# Patient Record
Sex: Female | Born: 1960 | Race: White | Hispanic: No | Marital: Married | Smoking: Former smoker
Health system: Southern US, Community
[De-identification: ages and names within clinical notes are randomized; demographics above are authoritative.]

## PROBLEM LIST (undated history)

## (undated) DIAGNOSIS — K219 Gastro-esophageal reflux disease without esophagitis: Secondary | ICD-10-CM

## (undated) DIAGNOSIS — K209 Esophagitis, unspecified: Secondary | ICD-10-CM

## (undated) DIAGNOSIS — Z8719 Personal history of other diseases of the digestive system: Secondary | ICD-10-CM

## (undated) DIAGNOSIS — I10 Essential (primary) hypertension: Secondary | ICD-10-CM

## (undated) DIAGNOSIS — N200 Calculus of kidney: Secondary | ICD-10-CM

## (undated) HISTORY — DX: Gastro-esophageal reflux disease without esophagitis: K21.9

## (undated) HISTORY — PX: COLONOSCOPY: SHX174

## (undated) HISTORY — DX: Essential (primary) hypertension: I10

## (undated) HISTORY — DX: Calculus of kidney: N20.0

## (undated) HISTORY — PX: BREAST LUMPECTOMY: SHX2

## (undated) HISTORY — PX: UPPER GASTROINTESTINAL ENDOSCOPY: SHX188

## (undated) HISTORY — PX: WISDOM TOOTH EXTRACTION: SHX21

## (undated) HISTORY — DX: Esophagitis, unspecified: K20.9

---

## 1988-06-19 HISTORY — PX: TUBAL LIGATION: SHX77

## 1997-11-26 ENCOUNTER — Other Ambulatory Visit: Admission: RE | Admit: 1997-11-26 | Discharge: 1997-11-26 | Payer: Self-pay | Admitting: Obstetrics and Gynecology

## 1998-12-14 ENCOUNTER — Other Ambulatory Visit: Admission: RE | Admit: 1998-12-14 | Discharge: 1998-12-14 | Payer: Self-pay | Admitting: Obstetrics and Gynecology

## 2000-02-14 ENCOUNTER — Other Ambulatory Visit: Admission: RE | Admit: 2000-02-14 | Discharge: 2000-02-14 | Payer: Self-pay | Admitting: Obstetrics and Gynecology

## 2001-02-28 ENCOUNTER — Other Ambulatory Visit: Admission: RE | Admit: 2001-02-28 | Discharge: 2001-02-28 | Payer: Self-pay | Admitting: Obstetrics and Gynecology

## 2005-04-12 ENCOUNTER — Ambulatory Visit: Payer: Self-pay | Admitting: Gastroenterology

## 2005-04-21 ENCOUNTER — Ambulatory Visit: Payer: Self-pay | Admitting: Gastroenterology

## 2005-04-27 ENCOUNTER — Ambulatory Visit: Payer: Self-pay | Admitting: Gastroenterology

## 2005-05-02 ENCOUNTER — Ambulatory Visit: Payer: Self-pay | Admitting: Gastroenterology

## 2005-05-23 ENCOUNTER — Ambulatory Visit: Payer: Self-pay | Admitting: Gastroenterology

## 2005-06-02 ENCOUNTER — Ambulatory Visit: Payer: Self-pay | Admitting: Gastroenterology

## 2009-08-17 DIAGNOSIS — K209 Esophagitis, unspecified without bleeding: Secondary | ICD-10-CM

## 2009-08-17 HISTORY — DX: Esophagitis, unspecified without bleeding: K20.90

## 2010-11-22 ENCOUNTER — Other Ambulatory Visit: Payer: Self-pay | Admitting: Obstetrics & Gynecology

## 2010-11-22 DIAGNOSIS — Z8271 Family history of polycystic kidney: Secondary | ICD-10-CM

## 2010-11-25 ENCOUNTER — Ambulatory Visit
Admission: RE | Admit: 2010-11-25 | Discharge: 2010-11-25 | Disposition: A | Discharge status: Home / Self Care | Payer: BC Managed Care – PPO | Source: Ambulatory Visit | Attending: Obstetrics & Gynecology | Admitting: Obstetrics & Gynecology

## 2010-11-25 ENCOUNTER — Other Ambulatory Visit: Payer: Self-pay

## 2010-11-25 DIAGNOSIS — Z8271 Family history of polycystic kidney: Secondary | ICD-10-CM

## 2012-09-06 IMAGING — US US RENAL
1 series · 14 of 25 positions shown · non-contrast
Comparison: None.

CLINICAL DATA: History of polycystic kidney disease.

RENAL/URINARY TRACT ULTRASOUND COMPLETE

[Series 1: us renal · 0.35mm/px · 14 of 34 slices shown]
[im 1/34]
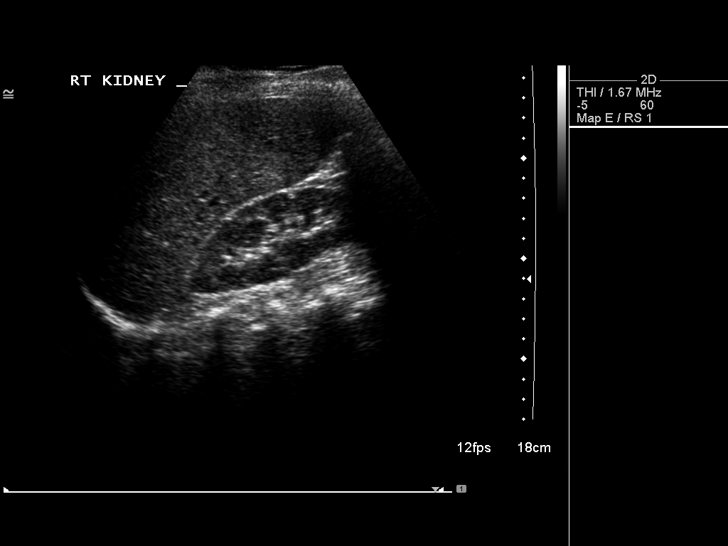
[im 3/34]
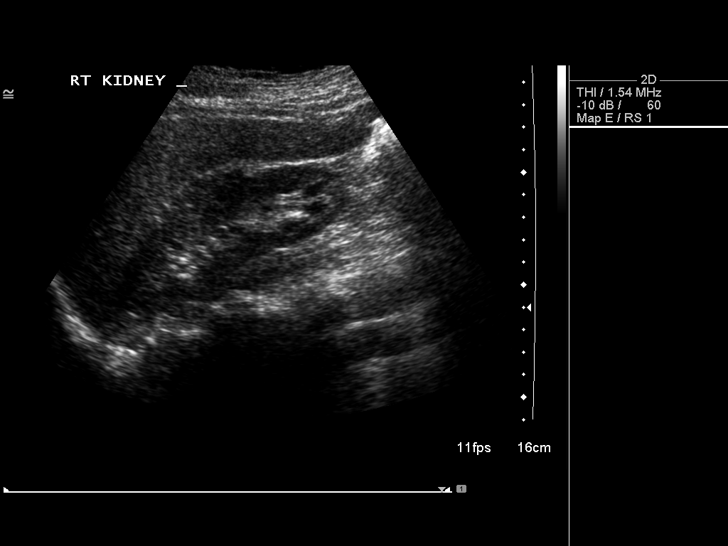
[im 6/34]
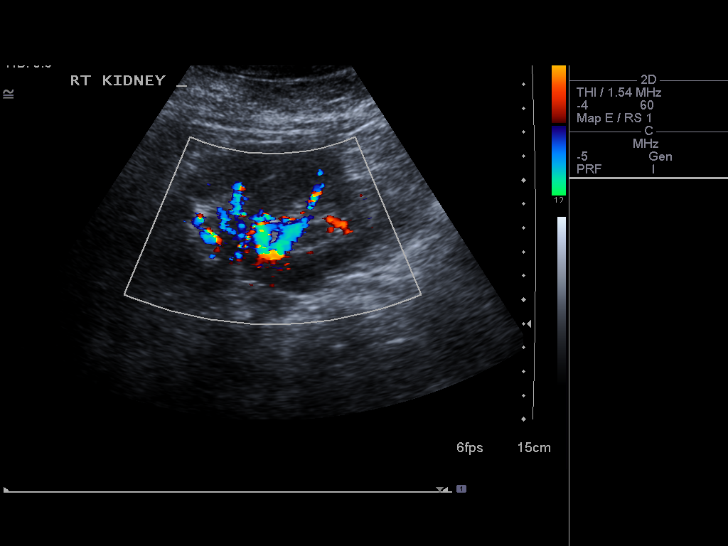
[im 9/34]
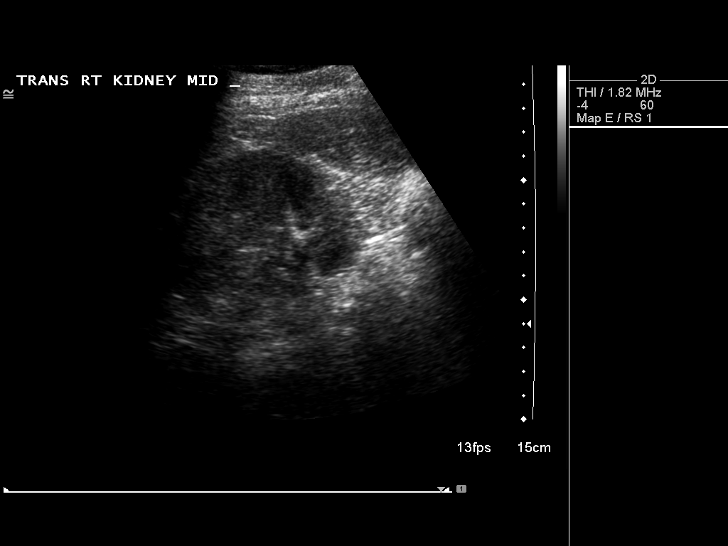
[im 12/34]
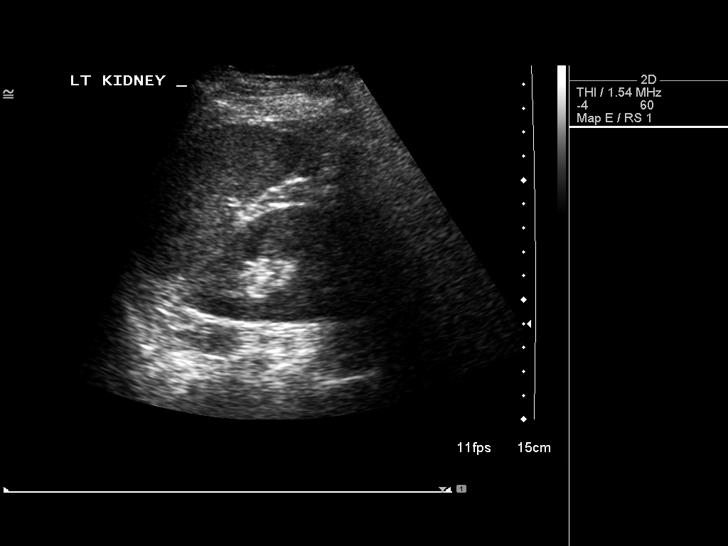
[im 13/34]
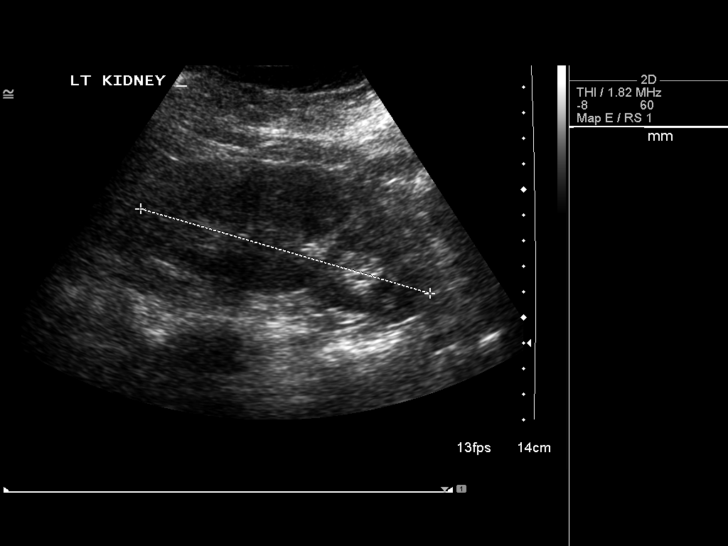
[im 16/34]
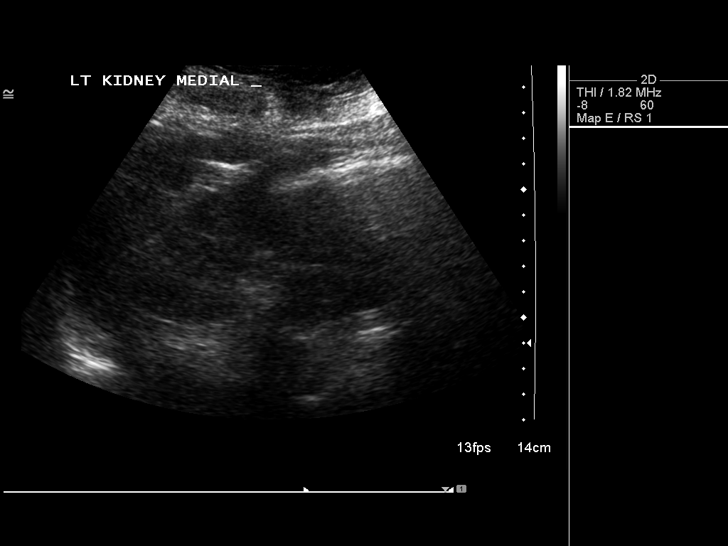
[im 18/34]
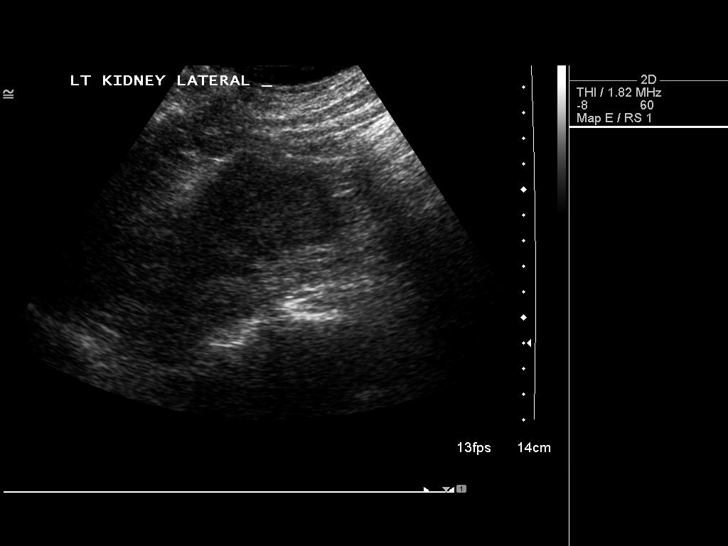
[im 21/34]
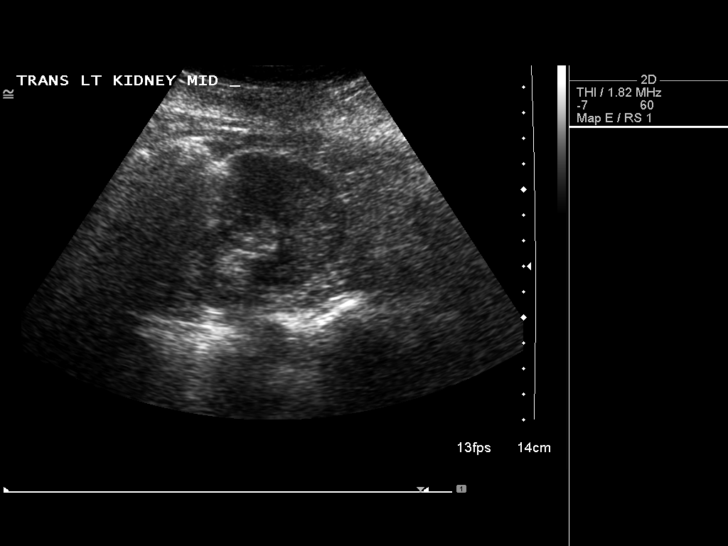
[im 23/34]
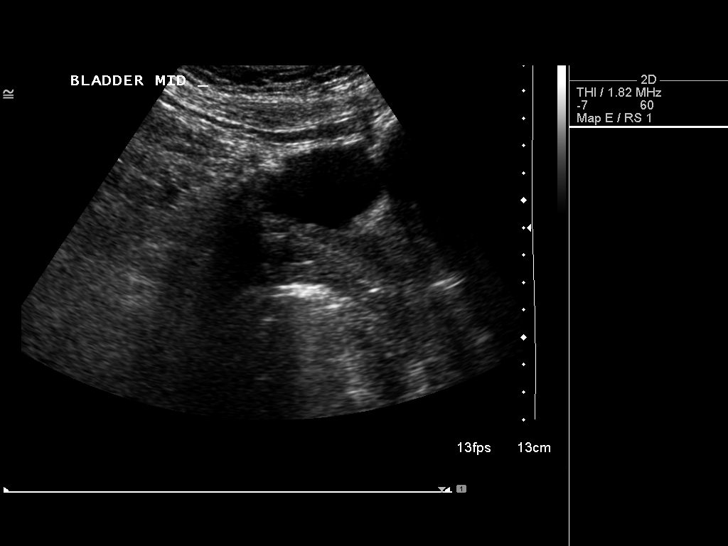
[im 25/34]
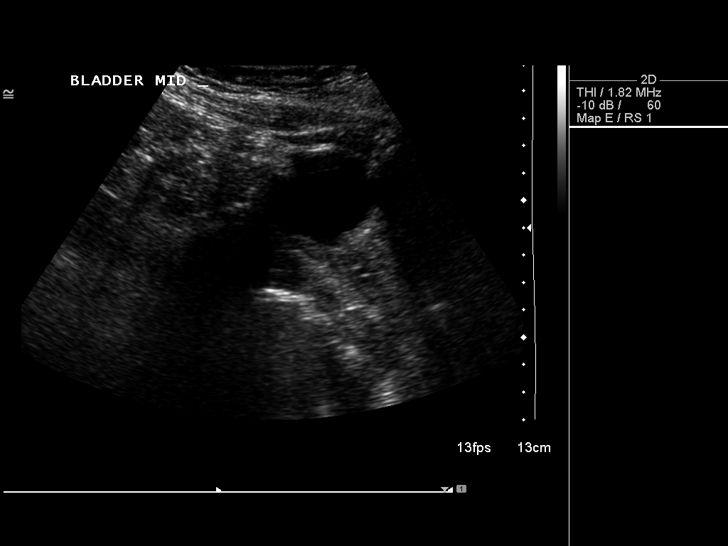
[im 28/34]
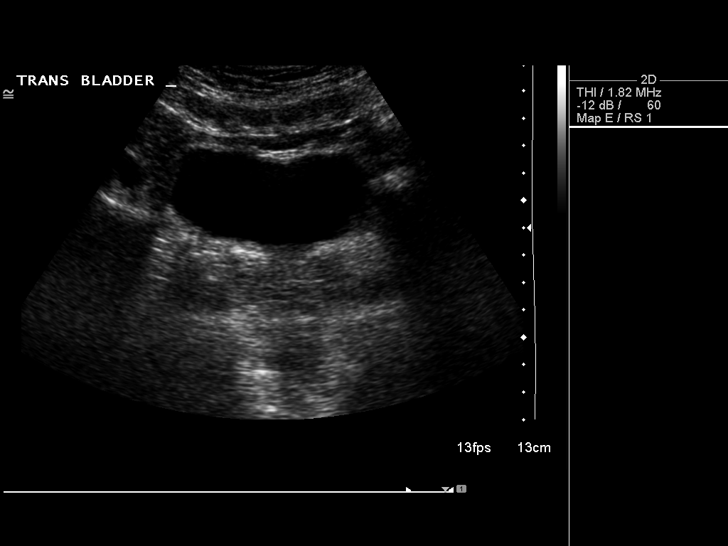
[im 31/34]
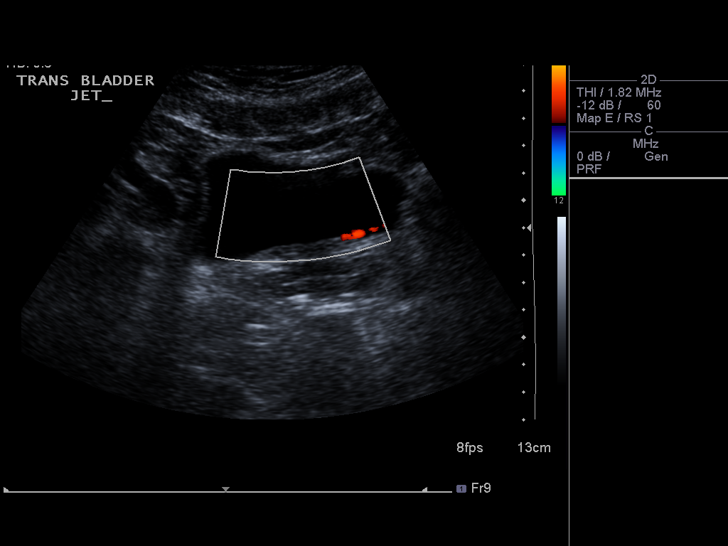
[im 34/34]
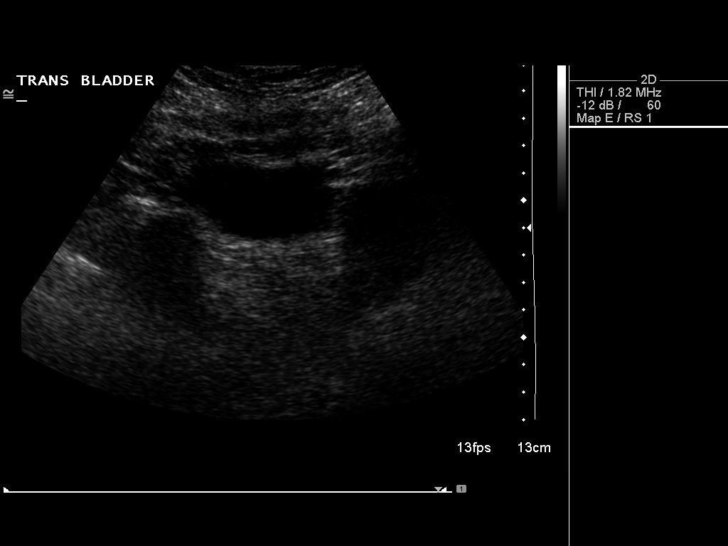

[14 of 25 positions shown; findings below may reference images not displayed]

FINDINGS: Right Kidney:  Measures 11.4 cm and appears normal.  No stone, mass
or hydronephrosis.

Left Kidney:  Measures 11.8 cm and appears normal.  No stone, mass
or hydronephrosis.

Bladder:  Unremarkable.
IMPRESSION: Normal study.

## 2012-09-24 ENCOUNTER — Encounter: Payer: Self-pay | Admitting: Obstetrics & Gynecology

## 2012-09-24 ENCOUNTER — Telehealth: Payer: Self-pay | Admitting: *Deleted

## 2012-09-24 NOTE — Telephone Encounter (Signed)
MAMMOGRAM TOOK OUT OF HOLD PER DR. MILLER FOR UNILATERAL MAMMOGRAM OF RIGHT BREAST DONE ON 04/03 @ [facility]. SUE

## 2012-12-24 ENCOUNTER — Ambulatory Visit (INDEPENDENT_AMBULATORY_CARE_PROVIDER_SITE_OTHER): Payer: BC Managed Care – PPO | Admitting: Obstetrics & Gynecology

## 2012-12-24 ENCOUNTER — Encounter: Payer: Self-pay | Admitting: Obstetrics & Gynecology

## 2012-12-24 VITALS — BP 158/100 | HR 72 | Resp 16 | Ht 64.0 in | Wt 187.4 lb

## 2012-12-24 DIAGNOSIS — Z01419 Encounter for gynecological examination (general) (routine) without abnormal findings: Secondary | ICD-10-CM

## 2012-12-24 DIAGNOSIS — Z0000 Encounter for general adult medical examination without abnormal findings: Secondary | ICD-10-CM

## 2012-12-24 DIAGNOSIS — E782 Mixed hyperlipidemia: Secondary | ICD-10-CM

## 2012-12-24 LAB — POCT URINALYSIS DIPSTICK
Ketones, UA: NEGATIVE
Nitrite, UA: NEGATIVE
Urobilinogen, UA: NEGATIVE
pH, UA: 5

## 2012-12-24 LAB — HEMOGLOBIN, FINGERSTICK: Hemoglobin, fingerstick: 14 g/dL (ref 12.0–16.0)

## 2012-12-24 NOTE — Patient Instructions (Signed)

## 2012-12-24 NOTE — Progress Notes (Signed)
52 y.o. G2P2 MarriedCaucasianF here for annual exam.  Went to beach last weekend.  Last cycle was 1/14.  Having some hot flashes.  Very manageable for patient.    Patient's last menstrual period was 06/19/2012.          Sexually active: yes  The current method of family planning is tubal ligation.    Exercising: yes  walking Smoker:  no  Health Maintenance: Pap:  11/23/11 WNL/negative HR HPV History of abnormal Pap:  no MMG:  09/13/12 MMG, 09/19/12 additional views-normal Colonoscopy:  2006 repeat in 10 years (at [facility]) BMD:   none TDaP:  3/11 Screening Labs: , Hb today: 14.0, Urine today: WBC-trace, PROTEIN-trace   reports that she has never smoked. She has never used smokeless tobacco. She reports that she drinks about 2.0 ounces of alcohol per week. She reports that she does not use illicit drugs.  Past Medical History  Diagnosis Date  . Acid reflux   . Kidney stone   . Esophagitis 3/11     noted on EGD    Past Surgical History  Procedure Laterality Date  . Breast lumpectomy  age 38    fibroadenoma  . Tubal ligation  1990  . Wisdom tooth extraction      Current Outpatient Prescriptions  Medication Sig Dispense Refill  . Multiple Vitamins-Minerals (OCUVITE PO) Take by mouth daily.      . Omega-3 Fatty Acids (FISH OIL PO) Take by mouth daily.      . Omeprazole (PRILOSEC PO) Take by mouth. Every other day       No current facility-administered medications for this visit.    Family History  Problem Relation Age of Onset  . Hypertension Mother   . Hypertension Father   . Polycystic kidney disease Father   . Cancer Father     lung  . Infertility Sister   . Polycystic kidney disease Sister     ROS:  Pertinent items are noted in HPI.  Otherwise, a comprehensive ROS was negative.  Exam:   BP 158/100  Pulse 72  Resp 16  Ht 5\' 4"  (1.626 m)  Wt 187 lb 6.4 oz (85.004 kg)  BMI 32.15 kg/m2  LMP 06/19/2012  Weight change: +9 lbs   Height: 5\' 4"  (162.6 cm)  Ht Readings  from Last 3 Encounters:  12/24/12 5\' 4"  (1.626 m)    General appearance: alert, cooperative and appears stated age Head: Normocephalic, without obvious abnormality, atraumatic Neck: no adenopathy, supple, symmetrical, trachea midline and thyroid normal to inspection and palpation Lungs: clear to auscultation bilaterally Breasts: normal appearance, no masses or tenderness Heart: regular rate and rhythm Abdomen: soft, non-tender; bowel sounds normal; no masses,  no organomegaly Extremities: extremities normal, atraumatic, no cyanosis or edema Skin: Skin color, texture, turgor normal. No rashes or lesions Lymph nodes: Cervical, supraclavicular, and axillary nodes normal. No abnormal inguinal nodes palpated Neurologic: Grossly normal   Pelvic: External genitalia:  no lesions              Urethra:  normal appearing urethra with no masses, tenderness or lesions              Bartholins and Skenes: normal                 Vagina: normal appearing vagina with normal color and discharge, no lesions              Cervix: no lesions  Pap taken: no Bimanual Exam:  Uterus:  normal size, contour, position, consistency, mobility, non-tender              Adnexa: normal adnexa and no mass, fullness, tenderness               Rectovaginal: Confirms               Anus:  normal sphincter tone, no lesions  A:  Well Woman with normal exam Perimenopausal.  Bleeding precautions given. Family hx of PCKD.  Pt with negative U/S 2012  P:   Mammogram yearly pap smear with neg HR HPV 1 year ago CMP, TSH, Vit D, CMP today return annually or prn  An After Visit Summary was printed and given to the patient.

## 2012-12-25 ENCOUNTER — Telehealth: Payer: Self-pay

## 2012-12-25 LAB — TSH: TSH: 1.478 u[IU]/mL (ref 0.350–4.500)

## 2012-12-25 LAB — COMPREHENSIVE METABOLIC PANEL
ALT: 22 U/L (ref 0–35)
CO2: 26 mEq/L (ref 19–32)
Chloride: 104 mEq/L (ref 96–112)
Sodium: 139 mEq/L (ref 135–145)
Total Bilirubin: 0.4 mg/dL (ref 0.3–1.2)
Total Protein: 6.2 g/dL (ref 6.0–8.3)

## 2012-12-25 LAB — LIPID PANEL: Total CHOL/HDL Ratio: 4.5 Ratio

## 2012-12-25 LAB — VITAMIN D 25 HYDROXY (VIT D DEFICIENCY, FRACTURES): Vit D, 25-Hydroxy: 56 ng/mL (ref 30–89)

## 2012-12-25 NOTE — Telephone Encounter (Signed)
7/9 [email or web address] 

## 2012-12-25 NOTE — Telephone Encounter (Signed)
Message copied by Elisha Headland on Wed Dec 25, 2012  9:34 AM ------      Message from: Jerene Bears      Created: Wed Dec 25, 2012  7:54 AM       Inform labs fine except triglycerides are really high.  Need to repeat fasting lipids.  Order entered. ------

## 2012-12-25 NOTE — Addendum Note (Signed)
Addended by: Jerene Bears on: 12/25/2012 07:55 AM   Modules accepted: Orders

## 2012-12-26 NOTE — Telephone Encounter (Signed)
Patient notified of all results. 

## 2012-12-31 ENCOUNTER — Other Ambulatory Visit (INDEPENDENT_AMBULATORY_CARE_PROVIDER_SITE_OTHER): Payer: BC Managed Care – PPO

## 2012-12-31 DIAGNOSIS — E782 Mixed hyperlipidemia: Secondary | ICD-10-CM

## 2012-12-31 LAB — LIPID PANEL
Cholesterol: 213 mg/dL — ABNORMAL HIGH (ref 0–200)
LDL Cholesterol: 128 mg/dL — ABNORMAL HIGH (ref 0–99)
VLDL: 38 mg/dL (ref 0–40)

## 2013-01-02 ENCOUNTER — Telehealth: Payer: Self-pay

## 2013-01-02 NOTE — Telephone Encounter (Signed)
7/17 [email or web address] 

## 2013-01-02 NOTE — Telephone Encounter (Signed)
Message copied by Elisha Headland on Thu Jan 02, 2013  5:17 PM ------      Message from: Jerene Bears      Created: Wed Jan 01, 2013  9:54 AM       inform lipids better fasting but triglycerides still almost 200.  Does she have pcp.  i think needs to be followed by pcp now.  Probably doesn't need treatment yet but if keeps going up, she will. ------

## 2013-01-03 ENCOUNTER — Telehealth: Payer: Self-pay | Admitting: Obstetrics & Gynecology

## 2013-01-03 DIAGNOSIS — I10 Essential (primary) hypertension: Secondary | ICD-10-CM

## 2013-01-03 DIAGNOSIS — E782 Mixed hyperlipidemia: Secondary | ICD-10-CM

## 2013-01-03 NOTE — Telephone Encounter (Signed)
Patient returned phone call. °

## 2013-01-06 NOTE — Telephone Encounter (Signed)
Patient notified of results. Does not have a PCP. I told her Dr Fabian Sharp or Dr Ricki Miller are usually the ones we recommend and she should also address her hypertension with them as well.

## 2013-01-06 NOTE — Telephone Encounter (Signed)
Will she call to schedule or do we need to refer?

## 2013-01-06 NOTE — Telephone Encounter (Signed)
I gave patient both numbers and offered but she wanted to do it. I did ask her to let me know where she decides to go so I can fax results to them.

## 2013-01-16 NOTE — Telephone Encounter (Signed)
Please call regarding a referral, patient needs to give info.

## 2013-01-20 NOTE — Telephone Encounter (Signed)
Order entered

## 2013-01-20 NOTE — Telephone Encounter (Signed)
Patient records faxed to [facility]. Office for appointment to be made with Dr. Ricki Miller.Marland Kitchen Referral faxed from Dr. Hyacinth Meeker. Patient notified of this.

## 2013-01-20 NOTE — Telephone Encounter (Signed)
Patient last AEX with Dr. Hyacinth Meeker 12/24/2012 Patient needing PCP due to lab results. Patient called Dr. Ricki Miller office and was told he was not taking new patient but if our office would send referral she might could be seen by Dr. Ricki Miller @ [facility]. Spoke with referral personal @ [facility] of need to referr patient. Will need to fax ins., demographics , and office notes.  Can you put referral in and will fax that also .  sue

## 2013-02-04 ENCOUNTER — Telehealth: Payer: Self-pay | Admitting: Orthopedic Surgery

## 2013-02-04 NOTE — Telephone Encounter (Signed)
LM on pt's VM re: appt at Dr. Lynne Logan office 02-13-13 at 11:30. Phone and address given. Advised pt to arrive 20 min early with ins. card and pic ID. Pt to call back with any questions.

## 2013-04-24 ENCOUNTER — Other Ambulatory Visit: Payer: Self-pay

## 2014-03-05 ENCOUNTER — Encounter: Payer: Self-pay | Admitting: Obstetrics & Gynecology

## 2014-03-05 ENCOUNTER — Ambulatory Visit (INDEPENDENT_AMBULATORY_CARE_PROVIDER_SITE_OTHER): Payer: BC Managed Care – PPO | Admitting: Obstetrics & Gynecology

## 2014-03-05 VITALS — BP 104/76 | HR 68 | Resp 12 | Ht 64.25 in | Wt 187.2 lb

## 2014-03-05 DIAGNOSIS — I10 Essential (primary) hypertension: Secondary | ICD-10-CM

## 2014-03-05 DIAGNOSIS — Z01419 Encounter for gynecological examination (general) (routine) without abnormal findings: Secondary | ICD-10-CM

## 2014-03-05 DIAGNOSIS — Z0000 Encounter for general adult medical examination without abnormal findings: Secondary | ICD-10-CM

## 2014-03-05 DIAGNOSIS — Z124 Encounter for screening for malignant neoplasm of cervix: Secondary | ICD-10-CM

## 2014-03-05 LAB — POCT URINALYSIS DIPSTICK
BILIRUBIN UA: NEGATIVE
Glucose, UA: NEGATIVE
KETONES UA: NEGATIVE
Leukocytes, UA: NEGATIVE
Nitrite, UA: NEGATIVE
PH UA: 8
PROTEIN UA: NEGATIVE
RBC UA: NEGATIVE
Urobilinogen, UA: NEGATIVE

## 2014-03-05 NOTE — Progress Notes (Signed)
53 y.o. G2P2 MarriedCaucasianF here for annual exam.  No VB in almost two years.  Started antihypertensive with Dr. Ricki Joanny Dupree.  Now being seen every six months.    Patient's last menstrual period was 06/19/2012.          Sexually active: Yes.    The current method of family planning is tubal ligation.    Exercising: Yes.    walking and riding bike-not regularly Smoker:  no  Health Maintenance: Pap:  11/23/11 WNL, negative HR HPV History of abnormal Pap:  no MMG:  09/23/13-normal Colonoscopy:  11/06-repeat in 10 years BMD:   none TDaP:  3/11 Screening Labs: PCP, Hb today: PCP, Urine today: PH-8.0   reports that she has never smoked. She has never used smokeless tobacco. She reports that she drinks about 3.5 ounces of alcohol per week. She reports that she does not use illicit drugs.  Past Medical History  Diagnosis Date  . Acid reflux   . Kidney stone   . Esophagitis 3/11     noted on EGD    Past Surgical History  Procedure Laterality Date  . Breast lumpectomy  age 16    fibroadenoma  . Tubal ligation  1990  . Wisdom tooth extraction      Current Outpatient Prescriptions  Medication Sig Dispense Refill  . Cholecalciferol (VITAMIN D PO) Take by mouth daily.      . Esomeprazole Magnesium (NEXIUM PO) Take by mouth. OTC- every other day      . Omega-3 Fatty Acids (FISH OIL PO) Take by mouth daily.      . valsartan-hydrochlorothiazide (DIOVAN-HCT) 160-25 MG per tablet Take 1 tablet by mouth daily.       No current facility-administered medications for this visit.    Family History  Problem Relation Age of Onset  . Hypertension Mother   . Hypertension Father   . Polycystic kidney disease Father   . Cancer Father     lung  . Infertility Sister   . Polycystic kidney disease Sister     ROS:  Pertinent items are noted in HPI.  Otherwise, a comprehensive ROS was negative.  Exam:   BP 104/76  Pulse 68  Resp 12  Ht 5' 4.25" (1.632 m)  Wt 187 lb 3.2 oz (84.913 kg)  BMI 31.88  kg/m2  LMP 06/19/2012   Height: 5' 4.25" (163.2 cm)  Ht Readings from Last 3 Encounters:  03/05/14 5' 4.25" (1.632 m)  12/24/12  (1.626 m)    General appearance: alert, cooperative and appears stated age Head: Normocephalic, without obvious abnormality, atraumatic Neck: no adenopathy, supple, symmetrical, trachea midline and thyroid normal to inspection and palpation Lungs: clear to auscultation bilaterally Breasts: normal appearance, no masses or tenderness Heart: regular rate and rhythm Abdomen: soft, non-tender; bowel sounds normal; no masses,  no organomegaly Extremities: extremities normal, atraumatic, no cyanosis or edema Skin: Skin color, texture, turgor normal. No rashes or lesions Lymph nodes: Cervical, supraclavicular, and axillary nodes normal. No abnormal inguinal nodes palpated Neurologic: Grossly normal   Pelvic: External genitalia:  no lesions              Urethra:  normal appearing urethra with no masses, tenderness or lesions              Bartholins and Skenes: normal                 Vagina: normal appearing vagina with normal color and discharge, no lesions  Cervix: no lesions              Pap taken: Yes.   Bimanual Exam:  Uterus:  normal size, contour, position, consistency, mobility, non-tender              Adnexa: normal adnexa and no mass, fullness, tenderness               Rectovaginal: Confirms               Anus:  normal sphincter tone, no lesions  A:  Well Woman with normal exam  PMP, no HRT Family hx of PCKD. Pt with negative U/S 2012  Hypertension  P: Mammogram yearly  pap smear with neg HR HPV 2013.  Pap today. Labs with Dr. Ricki Adonis Yim.  Has appt next month.  return annually or prn   An After Visit Summary was printed and given to the patient.

## 2014-03-06 LAB — IPS PAP TEST WITH REFLEX TO HPV

## 2014-04-20 ENCOUNTER — Encounter: Payer: Self-pay | Admitting: Obstetrics & Gynecology

## 2015-04-13 ENCOUNTER — Ambulatory Visit (INDEPENDENT_AMBULATORY_CARE_PROVIDER_SITE_OTHER): Payer: PRIVATE HEALTH INSURANCE | Admitting: Obstetrics & Gynecology

## 2015-04-13 ENCOUNTER — Encounter: Payer: Self-pay | Admitting: Obstetrics & Gynecology

## 2015-04-13 VITALS — BP 126/78 | HR 72 | Resp 16 | Ht 64.5 in | Wt 202.0 lb

## 2015-04-13 DIAGNOSIS — Z01419 Encounter for gynecological examination (general) (routine) without abnormal findings: Secondary | ICD-10-CM

## 2015-04-13 DIAGNOSIS — Z0000 Encounter for general adult medical examination without abnormal findings: Secondary | ICD-10-CM

## 2015-04-13 DIAGNOSIS — Z1211 Encounter for screening for malignant neoplasm of colon: Secondary | ICD-10-CM

## 2015-04-13 LAB — POCT URINALYSIS DIPSTICK
BILIRUBIN UA: NEGATIVE
Blood, UA: NEGATIVE
Glucose, UA: NEGATIVE
Ketones, UA: NEGATIVE
Nitrite, UA: NEGATIVE
Protein, UA: NEGATIVE
Urobilinogen, UA: NEGATIVE
pH, UA: 5

## 2015-04-13 NOTE — Addendum Note (Signed)
Addended by: Jerene BearsMILLER, Avayah Raffety S on: 04/13/2015 02:24 PM   Modules accepted: Kipp BroodSmartSet

## 2015-04-13 NOTE — Progress Notes (Addendum)
54 y.o. G2P2 MarriedCaucasianF here for annual exam.  Doing well.  Will be seeing new PCP this year.  Denies vaginal bleeding.    PCP:  Shary Decamp.  Patient's last menstrual period was 06/19/2012.          Sexually active: Yes.    The current method of family planning is tubal ligation.    Exercising: No.  not regularly Smoker:  no  Health Maintenance: Pap:  03/05/14 WNL, neg pap with neg HR HPV 6/13 History of abnormal Pap:  no MMG:  09/30/14 MMG, 10/05/14 right US-right diag mmg/us due in 6 months-per patient had one 04/12/15 [facility] Colonoscopy:  11/06-repeat in 10 years, due this year.   BMD:   none TDaP:  3/11 Screening Labs: planned with PCP, Urine today: WBC-trace   reports that she has never smoked. She has never used smokeless tobacco. She reports that she drinks about 4.8 oz of alcohol per week. She reports that she does not use illicit drugs.  Past Medical History  Diagnosis Date  . Acid reflux   . Kidney stone   . Esophagitis 3/11     noted on EGD    Past Surgical History  Procedure Laterality Date  . Breast lumpectomy  age 40    fibroadenoma  . Tubal ligation  1990  . Wisdom tooth extraction      Current Outpatient Prescriptions  Medication Sig Dispense Refill  . Esomeprazole Magnesium (NEXIUM PO) Take by mouth. OTC- every other day    . Omega-3 Fatty Acids (FISH OIL PO) Take by mouth daily.    . valsartan-hydrochlorothiazide (DIOVAN-HCT) 160-25 MG per tablet Take 1 tablet by mouth daily.     No current facility-administered medications for this visit.    Family History  Problem Relation Age of Onset  . Hypertension Mother   . Hypertension Father   . Polycystic kidney disease Father   . Cancer Father     lung  . Infertility Sister   . Polycystic kidney disease Sister     ROS:  Pertinent items are noted in HPI.  Otherwise, a comprehensive ROS was negative.  Exam:   BP 126/78 mmHg  Pulse 72  Resp 16  Ht 5' 4.5" (1.638 m)  Wt 202 lb (91.627 kg)   BMI 34.15 kg/m2  LMP 06/19/2012   Height: 5' 4.5" (163.8 cm)  Ht Readings from Last 3 Encounters:  04/13/15 5' 4.5" (1.638 m)  03/05/14 5' 4.25" (1.632 m)  12/24/12  (1.626 m)    General appearance: alert, cooperative and appears stated age Head: Normocephalic, without obvious abnormality, atraumatic Neck: no adenopathy, supple, symmetrical, trachea midline and thyroid normal to inspection and palpation Lungs: clear to auscultation bilaterally Breasts: normal appearance, no masses or tenderness Heart: regular rate and rhythm Abdomen: soft, non-tender; bowel sounds normal; no masses,  no organomegaly Extremities: extremities normal, atraumatic, no cyanosis or edema Skin: Skin color, texture, turgor normal. No rashes or lesions Lymph nodes: Cervical, supraclavicular, and axillary nodes normal. No abnormal inguinal nodes palpated Neurologic: Grossly normal   Pelvic: External genitalia:  no lesions              Urethra:  normal appearing urethra with no masses, tenderness or lesions              Bartholins and Skenes: normal                 Vagina: normal appearing vagina with normal color and discharge, no lesions  Cervix: no lesions              Pap taken: No. Bimanual Exam:  Uterus:  normal size, contour, position, consistency, mobility, non-tender              Adnexa: normal adnexa and no mass, fullness, tenderness               Rectovaginal: Confirms               Anus:  normal sphincter tone, no lesions  Chaperone was present for exam.  A:  Well Woman with normal exam  PMP, no HRT Family hx of PCKD. Pt with negative U/S 2012. Hypertension  P: Mammogram yearly.  Release of records obtained. pap smear with neg HR HPV 2013. Pap neg 2015.  No pap today Labs planned with new PCP in january.  Recommended Hep C testing by CDC recommendations.  Pt will do with PCP. Referral for colonoscopy made to [facility]. return annually or prn

## 2015-05-03 ENCOUNTER — Telehealth: Payer: Self-pay | Admitting: Obstetrics & Gynecology

## 2015-05-03 NOTE — Telephone Encounter (Signed)
[facility] attempted to contact patient for scheduling and left voicemail. I followed up with patient and she stated she received the voicemail and is aware she needs to call for scheduling. This is for a colonoscopy.   Routing to provider for review. Closing referral.

## 2015-07-12 ENCOUNTER — Encounter: Payer: Self-pay | Admitting: Gastroenterology

## 2016-07-27 NOTE — Progress Notes (Signed)
56 y.o. G2P2 MarriedCaucasianF here for annual exam.  Doing well.  No vaginal bleeding.    PCP:  Dr. Ronne BinningMcKenzie PCP  No LMP recorded. Patient is not currently having periods (Reason: Perimenopausal).          Sexually active: Yes.    The current method of family planning is tubal ligation.    Exercising: No.  The patient does not participate in regular exercise at present. Smoker:  no  Health Maintenance: Pap:  03/05/14 negative  History of abnormal Pap:  no MMG:  10/06/15 BIRADS 2 benign  Colonoscopy:  2006- repeat 10 years.  Pt aware and would like referral BMD:   Never TdaP:  04/19/2016 Pneumonia vaccine(s):  never Zostavax:   never Hep C testing: will discuss with PCP- has appointment 08/15/16 Screening Labs: PCP, Hb today: PCP, Urine today: PCP   reports that she has never smoked. She has never used smokeless tobacco. She reports that she drinks about 4.8 oz of alcohol per week . She reports that she does not use drugs.  Past Medical History:  Diagnosis Date  . Acid reflux   . Esophagitis 3/11    noted on EGD  . Kidney stone     Past Surgical History:  Procedure Laterality Date  . BREAST LUMPECTOMY  age 56   fibroadenoma  . TUBAL LIGATION  1990  . WISDOM TOOTH EXTRACTION      Current Outpatient Prescriptions  Medication Sig Dispense Refill  . Esomeprazole Magnesium (NEXIUM PO) Take by mouth. OTC- every other day    . Omega-3 Fatty Acids (FISH OIL PO) Take by mouth daily.    . valsartan-hydrochlorothiazide (DIOVAN-HCT) 160-25 MG per tablet Take 1 tablet by mouth daily.     No current facility-administered medications for this visit.     Family History  Problem Relation Age of Onset  . Hypertension Mother   . Hypertension Father   . Polycystic kidney disease Father   . Cancer Father     lung  . Infertility Sister   . Polycystic kidney disease Sister     ROS:  Pertinent items are noted in HPI.  Otherwise, a comprehensive ROS was negative.  Exam:   BP 134/80  (BP Location: Right Arm, Patient Position: Sitting, Cuff Size: Large)   Pulse 70   Resp 14   Ht 5' 4.5" (1.638 m)   Wt 210 lb 6.4 oz (95.4 kg)   BMI 35.56 kg/m   Weight change: +8#   Height: 5' 4.5" (163.8 cm)  Ht Readings from Last 3 Encounters:  07/28/16 5' 4.5" (1.638 m)  04/13/15 5' 4.5" (1.638 m)  03/05/14 5' 4.25" (1.632 m)    General appearance: alert, cooperative and appears stated age Head: Normocephalic, without obvious abnormality, atraumatic Neck: no adenopathy, supple, symmetrical, trachea midline and thyroid normal to inspection and palpation Lungs: clear to auscultation bilaterally Breasts: normal appearance, no masses or tenderness right breast, Left breast without masses, however there is a 2.5cm irregular bordered, flat erythematous, no LAD Heart: regular rate and rhythm Abdomen: soft, non-tender; bowel sounds normal; no masses,  no organomegaly Extremities: extremities normal, atraumatic, no cyanosis or edema Skin: Skin color, texture, turgor normal. No rashes or lesions Lymph nodes: Cervical, supraclavicular, and axillary nodes normal. No abnormal inguinal nodes palpated Neurologic: Grossly normal   Pelvic: External genitalia:  no lesions              Urethra:  normal appearing urethra with no masses, tenderness or lesions  Bartholins and Skenes: normal                 Vagina: normal appearing vagina with normal color and discharge, no lesions              Cervix: no lesions              Pap taken: Yes.   Bimanual Exam:  Uterus:  normal size, contour, position, consistency, mobility, non-tender              Adnexa: normal adnexa and no mass, fullness, tenderness               Rectovaginal: Confirms               Anus:  normal sphincter tone, no lesions  Chaperone was present for exam.  A:  Well Woman with normal exam PMP, no HRT Family hx of PCKD.  Negative ultrasound 2012. Abnormal appearing erythematous skin lesion on the left  breast Hypertension  P:   Mammogram guidelines reviewed Referral for colonoscopy made pap smear and HR HPV obtained today Pt will have Hep C testing done with PCP.  Considering seeing new PCP.  Name given. Derm referral will be made for pt. return annually or prn

## 2016-07-28 ENCOUNTER — Encounter: Payer: Self-pay | Admitting: Obstetrics & Gynecology

## 2016-07-28 ENCOUNTER — Encounter: Payer: Self-pay | Admitting: Gastroenterology

## 2016-07-28 ENCOUNTER — Ambulatory Visit (INDEPENDENT_AMBULATORY_CARE_PROVIDER_SITE_OTHER): Payer: PRIVATE HEALTH INSURANCE | Admitting: Obstetrics & Gynecology

## 2016-07-28 VITALS — BP 134/80 | HR 70 | Resp 14 | Ht 64.5 in | Wt 210.4 lb

## 2016-07-28 DIAGNOSIS — Z1211 Encounter for screening for malignant neoplasm of colon: Secondary | ICD-10-CM

## 2016-07-28 DIAGNOSIS — Z124 Encounter for screening for malignant neoplasm of cervix: Secondary | ICD-10-CM | POA: Diagnosis not present

## 2016-07-28 DIAGNOSIS — Z01419 Encounter for gynecological examination (general) (routine) without abnormal findings: Secondary | ICD-10-CM

## 2016-07-28 NOTE — Patient Instructions (Addendum)
Marissa Duarte Address: [address], [place], [place] [address]  Phone: [phone]  Have a Hepatitis C test done with your upcoming blood work.

## 2016-07-28 NOTE — Progress Notes (Signed)
Scheduled patient while in office to [facility] 08/10/2016 at 8 am. Patient is agreeable to date and time. OV notes from today faxed with cover sheet and confirmation to Dr.Stinehelfer at [phone].

## 2016-08-01 LAB — IPS PAP TEST WITH HPV

## 2016-08-23 ENCOUNTER — Ambulatory Visit (INDEPENDENT_AMBULATORY_CARE_PROVIDER_SITE_OTHER): Payer: No Typology Code available for payment source | Admitting: Adult Health

## 2016-08-23 ENCOUNTER — Encounter: Payer: Self-pay | Admitting: Adult Health

## 2016-08-23 VITALS — BP 133/84 | HR 76 | Ht 64.5 in | Wt 208.5 lb

## 2016-08-23 DIAGNOSIS — Z0000 Encounter for general adult medical examination without abnormal findings: Secondary | ICD-10-CM | POA: Diagnosis not present

## 2016-08-23 DIAGNOSIS — Z1321 Encounter for screening for nutritional disorder: Secondary | ICD-10-CM | POA: Diagnosis not present

## 2016-08-23 DIAGNOSIS — I10 Essential (primary) hypertension: Secondary | ICD-10-CM

## 2016-08-23 DIAGNOSIS — Z1159 Encounter for screening for other viral diseases: Secondary | ICD-10-CM

## 2016-08-23 DIAGNOSIS — Z833 Family history of diabetes mellitus: Secondary | ICD-10-CM

## 2016-08-23 DIAGNOSIS — R5383 Other fatigue: Secondary | ICD-10-CM | POA: Insufficient documentation

## 2016-08-23 DIAGNOSIS — E669 Obesity, unspecified: Secondary | ICD-10-CM

## 2016-08-23 NOTE — Progress Notes (Signed)
Subjective:    Patient ID: Marissa Duarte, female    DOB: Sep 28, 1960, 56 y.o.   MRN: [id number]  HPI:  Marissa Duarte presents to establish as a new pt.  She is pleasant 56 year old with PMH:  HTN, GERD, obesity, and occasional tension HA.  She would like to loss at least 25-30 lbs and resume a regular exercise program.  She drinks >90 ounces of water daily, consumes only 1 cup of coffee daily, and does not use tobacco products.    Patient Care Team    Relationship Specialty Notifications Start End  Malon Kindle, NP PCP - General Family Medicine  08/23/16     Patient Active Problem List   Diagnosis Date Noted  . Fatigue 08/23/2016  . Essential hypertension, benign 03/05/2014     Past Medical History:  Diagnosis Date  . Acid reflux   . Esophagitis 3/11    noted on EGD  . Hypertension   . Kidney stone      Past Surgical History:  Procedure Laterality Date  . BREAST LUMPECTOMY  age 51   fibroadenoma  . TUBAL LIGATION  1990  . WISDOM TOOTH EXTRACTION       Family History  Problem Relation Age of Onset  . Hypertension Father   . Polycystic kidney disease Father   . Cancer Father     lung  . Hypertension Mother   . Infertility Sister   . Hypertension Sister   . Polycystic kidney disease Sister   . Healthy Brother   . Heart attack Maternal Uncle   . Diabetes Maternal Grandfather      History  Drug Use No     History  Alcohol Use  . 7.2 oz/week  . 8 Standard drinks or equivalent, 4 Glasses of wine per week     History  Smoking Status  . Former Smoker  . Packs/day: 0.50  . Years: 2.00  . Types: Cigarettes  . Quit date: 06/19/1996  Smokeless Tobacco  . Never Used     Outpatient Encounter Prescriptions as of 08/23/2016  Medication Sig  . Ascorbic Acid (VITAMIN C) 1000 MG tablet Take 1,000 mg by mouth daily.  . Esomeprazole Magnesium (NEXIUM PO) Take by mouth. OTC- every other day  . Omega-3 Fatty Acids (FISH OIL PO) Take by mouth daily.  .  valsartan-hydrochlorothiazide (DIOVAN-HCT) 160-25 MG tablet Take 0.5 tablets by mouth daily.  . [DISCONTINUED] valsartan-hydrochlorothiazide (DIOVAN-HCT) 160-25 MG per tablet Take 1 tablet by mouth daily.   No facility-administered encounter medications on file as of 08/23/2016.     Allergies: Patient has no known allergies.  Body mass index is 35.24 kg/m.  Blood pressure 133/84, pulse 76, height 5' 4.5" (1.638 m), weight 208 lb 8 oz (94.6 kg).   Review of Systems  Constitutional: Positive for fatigue. Negative for activity change, appetite change, chills, diaphoresis, fever and unexpected weight change.  HENT: Negative for congestion.   Eyes: Negative for visual disturbance.  Respiratory: Negative for cough, chest tightness and shortness of breath.   Cardiovascular: Negative for chest pain, palpitations and leg swelling.  Gastrointestinal: Negative for abdominal distention, abdominal pain, blood in stool, constipation, diarrhea, nausea and vomiting.  Endocrine: Negative for cold intolerance, heat intolerance, polydipsia, polyphagia and polyuria.  Genitourinary: Negative for difficulty urinating and flank pain.  Musculoskeletal: Negative for arthralgias, back pain, gait problem, joint swelling and myalgias.  Skin: Negative for color change, pallor, rash and wound.  Allergic/Immunologic: Negative for immunocompromised state.  Neurological: Positive for headaches. Negative for dizziness, tremors, weakness and light-headedness.  Psychiatric/Behavioral: Negative for agitation, behavioral problems and confusion. The patient is not nervous/anxious.        Objective:   Physical Exam  Constitutional: She is oriented to person, place, and time. She appears well-developed and well-nourished. No distress.  HENT:  Head: Normocephalic and atraumatic.  Right Ear: External ear normal.  Left Ear: External ear normal.  Eyes: Conjunctivae are normal. Pupils are equal, round, and reactive to light.   Neck: Normal range of motion. Neck supple.  Cardiovascular: Normal rate, regular rhythm, normal heart sounds and intact distal pulses.   Pulmonary/Chest: Breath sounds normal. No respiratory distress. She has no wheezes. She has no rales. She exhibits no tenderness.  Abdominal: Soft. Bowel sounds are normal. She exhibits no distension and no mass. There is no tenderness. There is no rebound and no guarding.  Musculoskeletal: Normal range of motion.  Lymphadenopathy:    She has no cervical adenopathy.  Neurological: She is alert and oriented to person, place, and time.  Skin: Skin is warm and dry. No rash noted. She is not diaphoretic. No erythema. No pallor.  Psychiatric: She has a normal mood and affect. Her behavior is normal. Judgment and thought content normal.  Nursing note and vitals reviewed.         Assessment & Plan:   1. Essential hypertension, benign   2. Routine physical examination   3. Family history of diabetes mellitus in maternal grandfather   4. Encounter for hepatitis C screening test for low risk patient   5. Encounter for vitamin deficiency screening   6. Fatigue, unspecified type   7. Obesity (BMI 35.0-39.9 without comorbidity)     Essential hypertension, benign Continue valsartan/HCTZ 160/25mg  daily. Reduce sodium intake. Increase regular exercise.  Fatigue Continue excellent water intake. Practice good sleep hygiene. Will check Vit d level.  Obesity (BMI 35.0-39.9 without comorbidity) Increase regular movement, at least 30 mins 5 times week. Heart healthy diet. Try to loss at least 5 lbs in the next month and return to discuss medical weight loss-has been on phentermine previously without SE.    FOLLOW-UP:  Return in about 1 year (around 08/23/2017) for Regular Follow Up.

## 2016-08-23 NOTE — Assessment & Plan Note (Signed)
Continue valsartan/HCTZ 160/25mg  daily. Reduce sodium intake. Increase regular exercise.

## 2016-08-23 NOTE — Patient Instructions (Signed)
Exercising to Lose Weight Exercising can help you to lose weight. In order to lose weight through exercise, you need to do vigorous-intensity exercise. You can tell that you are exercising with vigorous intensity if you are breathing very hard and fast and cannot hold a conversation while exercising. Moderate-intensity exercise helps to maintain your current weight. You can tell that you are exercising at a moderate level if you have a higher heart rate and faster breathing, but you are still able to hold a conversation. How often should I exercise? Choose an activity that you enjoy and set realistic goals. Your health care provider can help you to make an activity plan that works for you. Exercise regularly as directed by your health care provider. This may include:  Doing resistance training twice each week, such as:  Push-ups.  Sit-ups.  Lifting weights.  Using resistance bands.  Doing a given intensity of exercise for a given amount of time. Choose from these options:  150 minutes of moderate-intensity exercise every week.  75 minutes of vigorous-intensity exercise every week.  A mix of moderate-intensity and vigorous-intensity exercise every week. Children, pregnant women, people who are out of shape, people who are overweight, and older adults may need to consult a health care provider for individual recommendations. If you have any sort of medical condition, be sure to consult your health care provider before starting a new exercise program. What are some activities that can help me to lose weight?  Walking at a rate of at least 4.5 miles an hour.  Jogging or running at a rate of 5 miles per hour.  Biking at a rate of at least 10 miles per hour.  Lap swimming.  Roller-skating or in-line skating.  Cross-country skiing.  Vigorous competitive sports, such as football, basketball, and soccer.  Jumping rope.  Aerobic dancing. How can I be more active in my day-to-day  activities?  Use the stairs instead of the elevator.  Take a walk during your lunch break.  If you drive, park your car farther away from work or school.  If you take public transportation, get off one stop early and walk the rest of the way.  Make all of your phone calls while standing up and walking around.  Get up, stretch, and walk around every 30 minutes throughout the day. What guidelines should I follow while exercising?  Do not exercise so much that you hurt yourself, feel dizzy, or get very short of breath.  Consult your health care provider prior to starting a new exercise program.  Wear comfortable clothes and shoes with good support.  Drink plenty of water while you exercise to prevent dehydration or heat stroke. Body water is lost during exercise and must be replaced.  Work out until you breathe faster and your heart beats faster. This information is not intended to replace advice given to you by your health care provider. Make sure you discuss any questions you have with your health care provider. Document Released: 07/08/2010 Document Revised: 11/11/2015 Document Reviewed: 11/06/2013 Elsevier Interactive Patient Education  2017 [place]. Heart-Healthy Eating Plan Many factors influence your heart health, including eating and exercise habits. Heart (coronary) risk increases with abnormal blood fat (lipid) levels. Heart-healthy meal planning includes limiting unhealthy fats, increasing healthy fats, and making other small dietary changes. This includes maintaining a healthy body weight to help keep lipid levels within a normal range. What is my plan? Your health care provider recommends that you:  Get no more  than _________% of the total calories in your daily diet from fat.  Limit your intake of saturated fat to less than _________% of your total calories each day.  Limit the amount of cholesterol in your diet to less than _________ mg per day. What types of  fat should I choose?  Choose healthy fats more often. Choose monounsaturated and polyunsaturated fats, such as olive oil and canola oil, flaxseeds, walnuts, almonds, and seeds.  Eat more omega-3 fats. Good choices include salmon, mackerel, sardines, tuna, flaxseed oil, and ground flaxseeds. Aim to eat fish at least two times each week.  Limit saturated fats. Saturated fats are primarily found in animal products, such as meats, butter, and cream. Plant sources of saturated fats include palm oil, palm kernel oil, and coconut oil.  Avoid foods with partially hydrogenated oils in them. These contain trans fats. Examples of foods that contain trans fats are stick margarine, some tub margarines, cookies, crackers, and other baked goods. What general guidelines do I need to follow?  Check food labels carefully to identify foods with trans fats or high amounts of saturated fat.  Fill one half of your plate with vegetables and green salads. Eat 4-5 servings of vegetables per day. A serving of vegetables equals 1 cup of raw leafy vegetables,  cup of raw or cooked cut-up vegetables, or  cup of vegetable juice.  Fill one fourth of your plate with whole grains. Look for the word "whole" as the first word in the ingredient list.  Fill one fourth of your plate with lean protein foods.  Eat 4-5 servings of fruit per day. A serving of fruit equals one medium whole fruit,  cup of dried fruit,  cup of fresh, frozen, or canned fruit, or  cup of 100% fruit juice.  Eat more foods that contain soluble fiber. Examples of foods that contain this type of fiber are apples, broccoli, carrots, beans, peas, and barley. Aim to get 20-30 g of fiber per day.  Eat more home-cooked food and less restaurant, buffet, and fast food.  Limit or avoid alcohol.  Limit foods that are high in starch and sugar.  Avoid fried foods.  Cook foods by using methods other than frying. Baking, boiling, grilling, and broiling are all  great options. Other fat-reducing suggestions include:  Removing the skin from poultry.  Removing all visible fats from meats.  Skimming the fat off of stews, soups, and gravies before serving them.  Steaming vegetables in water or broth.  Lose weight if you are overweight. Losing just 5-10% of your initial body weight can help your overall health and prevent diseases such as diabetes and heart disease.  Increase your consumption of nuts, legumes, and seeds to 4-5 servings per week. One serving of dried beans or legumes equals  cup after being cooked, one serving of nuts equals 1 ounces, and one serving of seeds equals  ounce or 1 tablespoon.  You may need to monitor your salt (sodium) intake, especially if you have high blood pressure. Talk with your health care provider or dietitian to get more information about reducing sodium. What foods can I eat? Grains   Breads, including [place], white, pita, wheat, raisin, rye, oatmeal, and [place]. Tortillas that are neither fried nor made with lard or trans fat. Low-fat rolls, including hotdog and hamburger buns and English muffins. Biscuits. Muffins. Waffles. Pancakes. Light popcorn. Whole-grain cereals. Flatbread. Melba toast. Pretzels. Breadsticks. Rusks. Low-fat snacks and crackers, including oyster, saltine, matzo, graham, animal, and  rye. Rice and pasta, including brown rice and those that are made with whole wheat. Vegetables  All vegetables. Fruits  All fruits, but limit coconut. Meats and Other Protein Sources  Lean, well-trimmed beef, veal, pork, and lamb. Chicken and [place] without skin. All fish and shellfish. Wild duck, rabbit, pheasant, and venison. Egg whites or low-cholesterol egg substitutes. Dried beans, peas, lentils, and tofu.Seeds and most nuts. Dairy  Low-fat or nonfat cheeses, including ricotta, string, and mozzarella. Skim or 1% milk that is liquid, powdered, or evaporated. Buttermilk that is made with low-fat milk.  Nonfat or low-fat yogurt. Beverages  Mineral water. Diet carbonated beverages. Sweets and Desserts  Sherbets and fruit ices. Honey, jam, marmalade, jelly, and syrups. Meringues and gelatins. Pure sugar candy, such as hard candy, jelly beans, gumdrops, mints, marshmallows, and small amounts of dark chocolate. [employer]. Eat all sweets and desserts in moderation. Fats and Oils  Nonhydrogenated (trans-free) margarines. Vegetable oils, including soybean, sesame, sunflower, olive, peanut, safflower, corn, canola, and cottonseed. Salad dressings or mayonnaise that are made with a vegetable oil. Limit added fats and oils that you use for cooking, baking, salads, and as spreads. Other  Cocoa powder. Coffee and tea. All seasonings and condiments. The items listed above may not be a complete list of recommended foods or beverages. Contact your dietitian for more options.  What foods are not recommended? Grains  Breads that are made with saturated or trans fats, oils, or whole milk. Croissants. Butter rolls. Cheese breads. Sweet rolls. Donuts. Buttered popcorn. Chow mein noodles. High-fat crackers, such as cheese or butter crackers. Meats and Other Protein Sources  Fatty meats, such as hotdogs, short ribs, sausage, spareribs, bacon, ribeye roast or steak, and mutton. High-fat deli meats, such as salami and bologna. Caviar. Domestic duck and goose. Organ meats, such as kidney, liver, sweetbreads, brains, gizzard, chitterlings, and heart. Dairy  Cream, sour cream, cream cheese, and creamed cottage cheese. Whole milk cheeses, including blue (bleu), [address], [place], [place], [address], [place], [address], [place], [place], and [place]. Whole or 2% milk that is liquid, evaporated, or condensed. Whole buttermilk. Cream sauce or high-fat cheese sauce. Yogurt that is made from whole milk. Beverages  Regular sodas and drinks with added sugar. Sweets and Desserts  Frosting. Pudding. Cookies. Cakes other than  angel food cake. Candy that has milk chocolate or white chocolate, hydrogenated fat, butter, coconut, or unknown ingredients. Buttered syrups. Full-fat ice cream or ice cream drinks. Fats and Oils  Gravy that has suet, meat fat, or shortening. Cocoa butter, hydrogenated oils, palm oil, coconut oil, palm kernel oil. These can often be found in baked products, candy, fried foods, nondairy creamers, and whipped toppings. Solid fats and shortenings, including bacon fat, salt pork, lard, and butter. Nondairy cream substitutes, such as coffee creamers and sour cream substitutes. Salad dressings that are made of unknown oils, cheese, or sour cream. The items listed above may not be a complete list of foods and beverages to avoid. Contact your dietitian for more information.  This information is not intended to replace advice given to you by your health care provider. Make sure you discuss any questions you have with your health care provider. Document Released: 03/14/2008 Document Revised: 12/24/2015 Document Reviewed: 11/27/2013 Elsevier Interactive Patient Education  2017 [employer]  Increase regular exercise and reduce saturated fat/sugar/sodium intake. Work on weight loss (try to loss at least 5 lbs) and please return next month to discuss medical weight loss (phentermine). Please schedule fasting lab nurse  visit at your convenience.

## 2016-08-23 NOTE — Assessment & Plan Note (Signed)
Continue excellent water intake. Practice good sleep hygiene. Will check Vit d level.

## 2016-08-23 NOTE — Assessment & Plan Note (Signed)
Increase regular movement, at least 30 mins 5 times week. Heart healthy diet. Try to loss at least 5 lbs in the next month and return to discuss medical weight loss-has been on phentermine previously without SE.

## 2016-09-08 ENCOUNTER — Other Ambulatory Visit (INDEPENDENT_AMBULATORY_CARE_PROVIDER_SITE_OTHER): Payer: No Typology Code available for payment source

## 2016-09-08 DIAGNOSIS — I10 Essential (primary) hypertension: Secondary | ICD-10-CM

## 2016-09-08 DIAGNOSIS — Z1159 Encounter for screening for other viral diseases: Secondary | ICD-10-CM

## 2016-09-08 DIAGNOSIS — Z0000 Encounter for general adult medical examination without abnormal findings: Secondary | ICD-10-CM

## 2016-09-08 DIAGNOSIS — Z1321 Encounter for screening for nutritional disorder: Secondary | ICD-10-CM | POA: Diagnosis not present

## 2016-09-08 DIAGNOSIS — Z833 Family history of diabetes mellitus: Secondary | ICD-10-CM | POA: Diagnosis not present

## 2016-09-09 LAB — CBC WITH DIFFERENTIAL/PLATELET
BASOS: 1 %
Basophils Absolute: 0 10*3/uL (ref 0.0–0.2)
EOS (ABSOLUTE): 0.1 10*3/uL (ref 0.0–0.4)
EOS: 2 %
HEMATOCRIT: 39.6 % (ref 34.0–46.6)
Hemoglobin: 13.2 g/dL (ref 11.1–15.9)
IMMATURE GRANS (ABS): 0 10*3/uL (ref 0.0–0.1)
IMMATURE GRANULOCYTES: 0 %
LYMPHS: 32 %
Lymphocytes Absolute: 1.7 10*3/uL (ref 0.7–3.1)
MCH: 29.7 pg (ref 26.6–33.0)
MCHC: 33.3 g/dL (ref 31.5–35.7)
MCV: 89 fL (ref 79–97)
Monocytes Absolute: 0.4 10*3/uL (ref 0.1–0.9)
Monocytes: 7 %
NEUTROS PCT: 58 %
Neutrophils Absolute: 3 10*3/uL (ref 1.4–7.0)
Platelets: 239 10*3/uL (ref 150–379)
RBC: 4.45 x10E6/uL (ref 3.77–5.28)
RDW: 13.9 % (ref 12.3–15.4)
WBC: 5.3 10*3/uL (ref 3.4–10.8)

## 2016-09-09 LAB — HEMOGLOBIN A1C
ESTIMATED AVERAGE GLUCOSE: 111 mg/dL
Hgb A1c MFr Bld: 5.5 % (ref 4.8–5.6)

## 2016-09-09 LAB — COMPREHENSIVE METABOLIC PANEL
ALT: 24 IU/L (ref 0–32)
AST: 21 IU/L (ref 0–40)
Albumin/Globulin Ratio: 2.4 — ABNORMAL HIGH (ref 1.2–2.2)
Albumin: 4.3 g/dL (ref 3.5–5.5)
Alkaline Phosphatase: 68 IU/L (ref 39–117)
BUN/Creatinine Ratio: 16 (ref 9–23)
BUN: 14 mg/dL (ref 6–24)
Bilirubin Total: 0.6 mg/dL (ref 0.0–1.2)
CALCIUM: 9.3 mg/dL (ref 8.7–10.2)
CO2: 26 mmol/L (ref 18–29)
CREATININE: 0.85 mg/dL (ref 0.57–1.00)
Chloride: 99 mmol/L (ref 96–106)
GFR calc Af Amer: 87 mL/min/{1.73_m2} (ref >59)
GFR, EST NON AFRICAN AMERICAN: 76 mL/min/{1.73_m2} (ref >59)
GLOBULIN, TOTAL: 1.8 g/dL (ref 1.5–4.5)
GLUCOSE: 90 mg/dL (ref 65–99)
Potassium: 4.2 mmol/L (ref 3.5–5.2)
Sodium: 141 mmol/L (ref 134–144)
Total Protein: 6.1 g/dL (ref 6.0–8.5)

## 2016-09-09 LAB — HEPATITIS C ANTIBODY

## 2016-09-09 LAB — LIPID PANEL
Chol/HDL Ratio: 4.1 ratio units (ref 0.0–4.4)
Cholesterol, Total: 213 mg/dL — ABNORMAL HIGH (ref 100–199)
HDL: 52 mg/dL (ref >39)
LDL Calculated: 136 mg/dL — ABNORMAL HIGH (ref 0–99)
Triglycerides: 124 mg/dL (ref 0–149)
VLDL Cholesterol Cal: 25 mg/dL (ref 5–40)

## 2016-09-09 LAB — TSH: TSH: 1.99 u[IU]/mL (ref 0.450–4.500)

## 2016-09-09 LAB — VITAMIN D 25 HYDROXY (VIT D DEFICIENCY, FRACTURES): VIT D 25 HYDROXY: 46.9 ng/mL (ref 30.0–100.0)

## 2016-09-12 ENCOUNTER — Telehealth: Payer: Self-pay | Admitting: Adult Health

## 2016-09-12 ENCOUNTER — Other Ambulatory Visit: Payer: Self-pay | Admitting: Adult Health

## 2016-09-12 DIAGNOSIS — I10 Essential (primary) hypertension: Secondary | ICD-10-CM

## 2016-09-12 MED ORDER — VALSARTAN-HYDROCHLOROTHIAZIDE 160-25 MG PO TABS: 0.5000 | ORAL_TABLET | Freq: Every day | ORAL | 3 refills | Status: DC

## 2016-09-12 NOTE — Progress Notes (Unsigned)
90

## 2016-09-12 NOTE — Telephone Encounter (Signed)
Pt called states she needs a refill for valsartan- HCTZ 160-20 MG sent to --- [facility] on [address]. --glh

## 2016-09-12 NOTE — Telephone Encounter (Signed)
We have not prescribed this medication for the patient in the past.  Please review and approve if appropriate.  Tiajuana Amass. Lamel Mccarley, CMA

## 2016-09-12 NOTE — Telephone Encounter (Signed)
Good Afternoon Marissa Duarte, Med refill sent in 90count with 3 refills. Thanks! [name]

## 2016-09-14 ENCOUNTER — Ambulatory Visit (AMBULATORY_SURGERY_CENTER): Payer: Self-pay

## 2016-09-14 VITALS — Ht 64.0 in | Wt 208.6 lb

## 2016-09-14 DIAGNOSIS — Z1211 Encounter for screening for malignant neoplasm of colon: Secondary | ICD-10-CM

## 2016-09-14 MED ORDER — NA SULFATE-K SULFATE-MG SULF 17.5-3.13-1.6 GM/177ML PO SOLN: 1.0000 | Freq: Once | ORAL | 0 refills | Status: AC

## 2016-09-14 NOTE — Progress Notes (Signed)
Denies allergies to eggs or soy products. Denies complication of anesthesia or sedation. Denies use of weight loss medication. Denies use of O2.   Emmi instructions declined.  

## 2016-09-19 ENCOUNTER — Encounter: Payer: Self-pay | Admitting: Gastroenterology

## 2016-09-20 ENCOUNTER — Encounter: Payer: Self-pay | Admitting: Adult Health

## 2016-09-20 ENCOUNTER — Ambulatory Visit (INDEPENDENT_AMBULATORY_CARE_PROVIDER_SITE_OTHER): Payer: No Typology Code available for payment source | Admitting: Adult Health

## 2016-09-20 DIAGNOSIS — E669 Obesity, unspecified: Secondary | ICD-10-CM | POA: Diagnosis not present

## 2016-09-20 MED ORDER — PHENTERMINE HCL 37.5 MG PO CAPS: 37.5000 mg | ORAL_CAPSULE | ORAL | 0 refills | Status: DC

## 2016-09-20 NOTE — Assessment & Plan Note (Addendum)
Started on Phentermine 37.5 mg daily Increase regular exercise-bike, treadmill, to at least 5 times/week. We discussed at length increasing fruit/vegetables and decreasing saturated fat/CHO/sugar/sodium. Recommend lean meats-fish, pork, baked chicken. Portion control and eating every few hours to reduce blood sugar fluctuations.  Return monthly for BP and weight checks.

## 2016-09-20 NOTE — Progress Notes (Signed)
Subjective:    Patient ID: Marissa Duarte, female    DOB: 11-12-1960, 56 y.o.   MRN: [id number]  HPI:  Ms. Kruk is here for weight check and to discuss medical weight loss-phentermine.  She went to the beach last week "I didn't eat well last week, but I did walk daily on the beach".  She has a home gym that consists of treadmill, stationary bike, and a "simply fit board".  Current exercise is 2 times/week.  She continues to consume 90-100 ounces water/daily.  She is also working on portion control and reducing saturates fat/CHO/sodium/sugar intake.   Patient Care Team    Relationship Specialty Notifications Start End  Malon Kindle, NP PCP - General Family Medicine  08/23/16     Patient Active Problem List   Diagnosis Date Noted  . Fatigue 08/23/2016  . Obesity (BMI 35.0-39.9 without comorbidity) 08/23/2016  . Essential hypertension, benign 03/05/2014     Past Medical History:  Diagnosis Date  . Acid reflux   . Esophagitis 3/11    noted on EGD  . Hypertension   . Kidney stone      Past Surgical History:  Procedure Laterality Date  . BREAST LUMPECTOMY  age 70   fibroadenoma  . COLONOSCOPY    . TUBAL LIGATION  1990  . UPPER GASTROINTESTINAL ENDOSCOPY    . WISDOM TOOTH EXTRACTION       Family History  Problem Relation Age of Onset  . Hypertension Father   . Polycystic kidney disease Father   . Cancer Father     lung  . Hypertension Mother   . Infertility Sister   . Hypertension Sister   . Polycystic kidney disease Sister   . Healthy Brother   . Heart attack Maternal Uncle   . Diabetes Maternal Grandfather   . Colon cancer Neg Hx   . Esophageal cancer Neg Hx   . Rectal cancer Neg Hx   . Stomach cancer Neg Hx      History  Drug Use No     History  Alcohol Use  . 7.2 oz/week  . 4 Glasses of wine, 8 Standard drinks or equivalent per week     History  Smoking Status  . Former Smoker  . Packs/day: 0.50  . Years: 20.00  . Types: Cigarettes  .  Quit date: 06/19/1996  Smokeless Tobacco  . Never Used     Outpatient Encounter Prescriptions as of 09/20/2016  Medication Sig  . Ascorbic Acid (VITAMIN C) 1000 MG tablet Take 1,000 mg by mouth daily.  . Esomeprazole Magnesium (NEXIUM PO) Take by mouth. OTC- every other day  . Omega-3 Fatty Acids (FISH OIL PO) Take by mouth daily.  . valsartan-hydrochlorothiazide (DIOVAN-HCT) 160-25 MG tablet Take 0.5 tablets by mouth daily.  . phentermine 37.5 MG capsule Take 1 capsule (37.5 mg total) by mouth every morning.   No facility-administered encounter medications on file as of 09/20/2016.     Allergies: Patient has no known allergies.  Body mass index is 35.71 kg/m.  Blood pressure 117/79, pulse 77, height 5' 4.5" (1.638 m), weight 211 lb 4.8 oz (95.8 kg), last menstrual period 06/19/2012.    Review of Systems  Constitutional: Negative for activity change, appetite change, chills, diaphoresis, fatigue, fever and unexpected weight change.  HENT: Negative for congestion.   Eyes: Negative for visual disturbance.  Respiratory: Negative for cough, chest tightness, shortness of breath, wheezing and stridor.   Cardiovascular: Negative for chest pain, palpitations  and leg swelling.  Gastrointestinal: Negative for abdominal distention, abdominal pain, blood in stool, constipation, diarrhea, nausea and vomiting.  Endocrine: Negative for cold intolerance, heat intolerance, polydipsia, polyphagia and polyuria.  Skin: Negative for color change, pallor, rash and wound.  Neurological: Negative for dizziness.  Psychiatric/Behavioral: Negative for agitation. The patient is not nervous/anxious.        Objective:   Physical Exam  Constitutional: She is oriented to person, place, and time. She appears well-developed and well-nourished. No distress.  Eyes: Conjunctivae are normal. Pupils are equal, round, and reactive to light.  Cardiovascular: Normal rate, regular rhythm, normal heart sounds and intact  distal pulses.   No murmur heard. Pulmonary/Chest: Breath sounds normal. No respiratory distress. She has no wheezes. She has no rales. She exhibits no tenderness.  Neurological: She is alert and oriented to person, place, and time.  Skin: Skin is warm and dry. No rash noted. She is not diaphoretic. No erythema. No pallor.  Psychiatric: She has a normal mood and affect. Her behavior is normal. Judgment and thought content normal.  Nursing note and vitals reviewed.         Assessment & Plan:   1. Obesity (BMI 35.0-39.9 without comorbidity)     Obesity (BMI 35.0-39.9 without comorbidity) Started on Phentermine 37.5 mg daily Increase regular exercise-bike, treadmill, to at least 5 times/week. We discussed at length increasing fruit/vegetables and decreasing saturated fat/CHO/sugar/sodium. Recommend lean meats-fish, pork, baked chicken. Portion control and eating every few hours to reduce blood sugar fluctuations.  Return monthly for BP and weight checks.    FOLLOW-UP:  Return in about 1 month (around 10/20/2016) for Regular Follow Up, Medical Weight Loss.

## 2016-09-20 NOTE — Patient Instructions (Addendum)

## 2016-09-28 ENCOUNTER — Encounter: Payer: Self-pay | Admitting: Gastroenterology

## 2016-09-28 ENCOUNTER — Ambulatory Visit (AMBULATORY_SURGERY_CENTER): Payer: No Typology Code available for payment source | Admitting: Gastroenterology

## 2016-09-28 VITALS — BP 112/69 | HR 77 | Temp 98.0°F | Resp 12 | Ht 64.0 in | Wt 208.0 lb

## 2016-09-28 DIAGNOSIS — Z1212 Encounter for screening for malignant neoplasm of rectum: Secondary | ICD-10-CM | POA: Diagnosis not present

## 2016-09-28 DIAGNOSIS — Z1211 Encounter for screening for malignant neoplasm of colon: Secondary | ICD-10-CM | POA: Diagnosis not present

## 2016-09-28 MED ORDER — SODIUM CHLORIDE 0.9 % IV SOLN: 500.0000 mL | INTRAVENOUS | Status: DC

## 2016-09-28 NOTE — Patient Instructions (Signed)

## 2016-09-28 NOTE — Progress Notes (Signed)
Report given to PACU, vss 

## 2016-09-28 NOTE — Op Note (Signed)
[facility] Patient Name: Marissa Duarte Procedure Date: 09/28/2016 9:42 AM MRN: [id number] Endoscopist: Sherilyn Cooter L. Myrtie Neither , MD Age: 56 Referring MD:  Date of Birth: [birth year] Gender: Female Account #: [id number] Procedure:                Colonoscopy Indications:              Screening for colorectal malignant neoplasm, This                            is the patient's first screening colonoscopy Medicines:                Monitored Anesthesia Care Procedure:                Pre-Anesthesia Assessment:                           - Prior to the procedure, a History and Physical                            was performed, and patient medications and                            allergies were reviewed. The patient's tolerance of                            previous anesthesia was also reviewed. The risks                            and benefits of the procedure and the sedation                            options and risks were discussed with the patient.                            All questions were answered, and informed consent                            was obtained. Prior Anticoagulants: The patient has                            taken no previous anticoagulant or antiplatelet                            agents. ASA Grade Assessment: II - A patient with                            mild systemic disease. After reviewing the risks                            and benefits, the patient was deemed in                            satisfactory condition to undergo the procedure.  After obtaining informed consent, the colonoscope                            was passed under direct vision. Throughout the                            procedure, the patient's blood pressure, pulse, and                            oxygen saturations were monitored continuously. The                            Colonoscope was introduced through the anus and                            advanced to the  the cecum, identified by                            appendiceal orifice and ileocecal valve. The                            colonoscopy was performed without difficulty. The                            patient tolerated the procedure well. The quality                            of the bowel preparation was excellent. The                            ileocecal valve, appendiceal orifice, and rectum                            were photographed. The quality of the bowel                            preparation was evaluated using the BBPS [facility]                            Bowel Preparation Scale) with scores of: Right                            Colon = 3, Transverse Colon = 3 and Left Colon = 3                            (entire mucosa seen well with no residual staining,                            small fragments of stool or opaque liquid). The                            total BBPS score equals 9. The bowel preparation  used was SUPREP. Scope In: 9:58:24 AM Scope Out: 10:11:07 AM Scope Withdrawal Time: 0 hours 8 minutes 14 seconds  Total Procedure Duration: 0 hours 12 minutes 43 seconds  Findings:                 The perianal and digital rectal examinations were                            normal.                           The entire examined colon appeared normal on direct                            and retroflexion views. Complications:            No immediate complications. Estimated Blood Loss:     Estimated blood loss: none. Impression:               - The entire examined colon is normal on direct and                            retroflexion views.                           - No specimens collected. Recommendation:           - Patient has a contact number available for                            emergencies. The signs and symptoms of potential                            delayed complications were discussed with the                            patient. Return to  normal activities tomorrow.                            Written discharge instructions were provided to the                            patient.                           - Resume previous diet.                           - Repeat colonoscopy in 10 years for screening                            purposes.                           - Continue present medications. Calley Drenning L. Myrtie Neither, MD 09/28/2016 10:26:00 AM This report has been signed electronically.

## 2016-09-29 ENCOUNTER — Telehealth: Payer: Self-pay

## 2016-09-29 NOTE — Telephone Encounter (Signed)
  Follow up Call-  Call back number 09/28/2016  Post procedure Call Back phone  # [phone]  Permission to leave phone message Yes  Some recent data might be hidden     Patient questions:  Do you have a fever, pain , or abdominal swelling? No. Pain Score  0 *  Have you tolerated food without any problems? Yes.    Have you been able to return to your normal activities? Yes.    Do you have any questions about your discharge instructions: Diet   No. Medications  No. Follow up visit  No.  Do you have questions or concerns about your Care? No.  Actions: * If pain score is 4 or above: No action needed, pain <4.

## 2016-10-19 ENCOUNTER — Encounter: Payer: Self-pay | Admitting: Adult Health

## 2016-10-19 ENCOUNTER — Ambulatory Visit (INDEPENDENT_AMBULATORY_CARE_PROVIDER_SITE_OTHER): Payer: No Typology Code available for payment source | Admitting: Adult Health

## 2016-10-19 ENCOUNTER — Encounter: Payer: Self-pay | Admitting: Obstetrics & Gynecology

## 2016-10-19 DIAGNOSIS — E669 Obesity, unspecified: Secondary | ICD-10-CM | POA: Diagnosis not present

## 2016-10-19 MED ORDER — PHENTERMINE HCL 37.5 MG PO CAPS: 37.5000 mg | ORAL_CAPSULE | ORAL | 0 refills | Status: DC

## 2016-10-19 NOTE — Progress Notes (Signed)
Subjective:    Patient ID: Marissa Duarte, female    DOB: 1960/08/04, 56 y.o.   MRN: [id number]  HPI:  Marissa Duarte is here for weight, BP check. 09/20/2016 wt was 211 Wt today 200!!! Great job! She has been exercising 3-5 days/week-treadmill and stationary bike. Diet has been "cleaned up" , fruits in the morning then vegetables/CHO/lean protein the rest of the day. She denies CP/dyspnea/dizziness/palpitations/insomnia.   Patient Care Team    Relationship Specialty Notifications Start End  Malon Kindle, NP PCP - General Family Medicine  08/23/16     Patient Active Problem List   Diagnosis Date Noted  . Fatigue 08/23/2016  . Obesity (BMI 35.0-39.9 without comorbidity) 08/23/2016  . Essential hypertension, benign 03/05/2014     Past Medical History:  Diagnosis Date  . Acid reflux   . Esophagitis 3/11    noted on EGD  . Hypertension   . Kidney stone      Past Surgical History:  Procedure Laterality Date  . BREAST LUMPECTOMY  age 31   fibroadenoma  . COLONOSCOPY    . TUBAL LIGATION  1990  . UPPER GASTROINTESTINAL ENDOSCOPY    . WISDOM TOOTH EXTRACTION       Family History  Problem Relation Age of Onset  . Hypertension Father   . Polycystic kidney disease Father   . Cancer Father     lung  . Hypertension Mother   . Infertility Sister   . Hypertension Sister   . Polycystic kidney disease Sister   . Healthy Brother   . Heart attack Maternal Uncle   . Diabetes Maternal Grandfather   . Colon cancer Neg Hx   . Esophageal cancer Neg Hx   . Rectal cancer Neg Hx   . Stomach cancer Neg Hx      History  Drug Use No     History  Alcohol Use  . 7.2 oz/week  . 4 Glasses of wine, 8 Standard drinks or equivalent per week     History  Smoking Status  . Former Smoker  . Packs/day: 0.50  . Years: 20.00  . Types: Cigarettes  . Quit date: 06/19/1996  Smokeless Tobacco  . Never Used     Outpatient Encounter Prescriptions as of 10/19/2016  Medication Sig  .  Ascorbic Acid (VITAMIN C) 1000 MG tablet Take 1,000 mg by mouth daily.  . Esomeprazole Magnesium (NEXIUM PO) Take by mouth. OTC- every other day  . Omega-3 Fatty Acids (FISH OIL PO) Take by mouth daily.  . phentermine 37.5 MG capsule Take 1 capsule (37.5 mg total) by mouth every morning.  . valsartan-hydrochlorothiazide (DIOVAN-HCT) 160-25 MG tablet Take 0.5 tablets by mouth daily.  . [DISCONTINUED] phentermine 37.5 MG capsule Take 1 capsule (37.5 mg total) by mouth every morning.   Facility-Administered Encounter Medications as of 10/19/2016  Medication  . 0.9 %  sodium chloride infusion    Allergies: Patient has no known allergies.  Body mass index is 33.8 kg/m.  Blood pressure 130/79, pulse 91, height 5' 4.5" (1.638 m), weight 200 lb (90.7 kg), last menstrual period 06/19/2012, SpO2 97 %.  Review of Systems  Constitutional: Negative for activity change, appetite change, chills, diaphoresis, fatigue, fever and unexpected weight change.  Respiratory: Negative for cough, chest tightness, shortness of breath, wheezing and stridor.   Cardiovascular: Negative for chest pain, palpitations and leg swelling.  Endocrine: Negative for cold intolerance, heat intolerance, polydipsia, polyphagia and polyuria.  Skin: Negative for color change, pallor, rash  and wound.  Neurological: Negative for headaches.  Hematological: Does not bruise/bleed easily.  Psychiatric/Behavioral: Negative for agitation, dysphoric mood and sleep disturbance. The patient is not hyperactive.        Objective:   Physical Exam  Constitutional: She appears well-developed and well-nourished. No distress.  Cardiovascular: Normal rate, regular rhythm, normal heart sounds and intact distal pulses.   No murmur heard. Pulmonary/Chest: Effort normal and breath sounds normal. No respiratory distress. She has no wheezes. She has no rales. She exhibits no tenderness.  Skin: Skin is warm and dry. No rash noted. She is not  diaphoretic. No erythema. No pallor.  Psychiatric: She has a normal mood and affect. Her behavior is normal. Judgment and thought content normal.  Nursing note and vitals reviewed.         Assessment & Plan:   1. Obesity (BMI 35.0-39.9 without comorbidity)     Obesity (BMI 35.0-39.9 without comorbidity) 11 lb weight loss since last ov-great job! Phentermine 37.5mg  30 day Rx provided. Continue regular exercise-treadmill, stationary bike.  Please add in light weight training-YouTube, Pinterest for easy to follow, at home programs. Return in 30 days for weight and BP check and Rx.    FOLLOW-UP:  Return in about 1 month (around 11/19/2016) for Medical Weight Loss.

## 2016-10-19 NOTE — Assessment & Plan Note (Signed)
11 lb weight loss since last ov-great job! Phentermine 37.5mg  30 day Rx provided. Continue regular exercise-treadmill, stationary bike.  Please add in light weight training-YouTube, Pinterest for easy to follow, at home programs. Return in 30 days for weight and BP check and Rx.

## 2016-10-19 NOTE — Patient Instructions (Signed)
Exercising to Lose Weight Exercising can help you to lose weight. In order to lose weight through exercise, you need to do vigorous-intensity exercise. You can tell that you are exercising with vigorous intensity if you are breathing very hard and fast and cannot hold a conversation while exercising. Moderate-intensity exercise helps to maintain your current weight. You can tell that you are exercising at a moderate level if you have a higher heart rate and faster breathing, but you are still able to hold a conversation. How often should I exercise? Choose an activity that you enjoy and set realistic goals. Your health care provider can help you to make an activity plan that works for you. Exercise regularly as directed by your health care provider. This may include:  Doing resistance training twice each week, such as:  Push-ups.  Sit-ups.  Lifting weights.  Using resistance bands.  Doing a given intensity of exercise for a given amount of time. Choose from these options:  150 minutes of moderate-intensity exercise every week.  75 minutes of vigorous-intensity exercise every week.  A mix of moderate-intensity and vigorous-intensity exercise every week. Children, pregnant women, people who are out of shape, people who are overweight, and older adults may need to consult a health care provider for individual recommendations. If you have any sort of medical condition, be sure to consult your health care provider before starting a new exercise program. What are some activities that can help me to lose weight?  Walking at a rate of at least 4.5 miles an hour.  Jogging or running at a rate of 5 miles per hour.  Biking at a rate of at least 10 miles per hour.  Lap swimming.  Roller-skating or in-line skating.  Cross-country skiing.  Vigorous competitive sports, such as football, basketball, and soccer.  Jumping rope.  Aerobic dancing. How can I be more active in my day-to-day  activities?  Use the stairs instead of the elevator.  Take a walk during your lunch break.  If you drive, park your car farther away from work or school.  If you take public transportation, get off one stop early and walk the rest of the way.  Make all of your phone calls while standing up and walking around.  Get up, stretch, and walk around every 30 minutes throughout the day. What guidelines should I follow while exercising?  Do not exercise so much that you hurt yourself, feel dizzy, or get very short of breath.  Consult your health care provider prior to starting a new exercise program.  Wear comfortable clothes and shoes with good support.  Drink plenty of water while you exercise to prevent dehydration or heat stroke. Body water is lost during exercise and must be replaced.  Work out until you breathe faster and your heart beats faster. This information is not intended to replace advice given to you by your health care provider. Make sure you discuss any questions you have with your health care provider. Document Released: 07/08/2010 Document Revised: 11/11/2015 Document Reviewed: 11/06/2013 Elsevier Interactive Patient Education  2017 [employer]  Continue all medications as directed. GREAT JOB! Try to increase regular movement and add in light weight training- YouTube, Pinterest for easy at home programs. Follow-up in one month. GREAT JOB!!!!

## 2016-11-21 ENCOUNTER — Encounter: Payer: Self-pay | Admitting: Adult Health

## 2016-11-21 ENCOUNTER — Ambulatory Visit (INDEPENDENT_AMBULATORY_CARE_PROVIDER_SITE_OTHER): Payer: No Typology Code available for payment source | Admitting: Adult Health

## 2016-11-21 DIAGNOSIS — I10 Essential (primary) hypertension: Secondary | ICD-10-CM | POA: Diagnosis not present

## 2016-11-21 DIAGNOSIS — E669 Obesity, unspecified: Secondary | ICD-10-CM

## 2016-11-21 MED ORDER — PHENTERMINE HCL 37.5 MG PO CAPS: 37.5000 mg | ORAL_CAPSULE | ORAL | 0 refills | Status: DC

## 2016-11-21 NOTE — Patient Instructions (Signed)
Exercising to Lose Weight Exercising can help you to lose weight. In order to lose weight through exercise, you need to do vigorous-intensity exercise. You can tell that you are exercising with vigorous intensity if you are breathing very hard and fast and cannot hold a conversation while exercising. Moderate-intensity exercise helps to maintain your current weight. You can tell that you are exercising at a moderate level if you have a higher heart rate and faster breathing, but you are still able to hold a conversation. How often should I exercise? Choose an activity that you enjoy and set realistic goals. Your health care provider can help you to make an activity plan that works for you. Exercise regularly as directed by your health care provider. This may include:  Doing resistance training twice each week, such as: ? Push-ups. ? Sit-ups. ? Lifting weights. ? Using resistance bands.  Doing a given intensity of exercise for a given amount of time. Choose from these options: ? 150 minutes of moderate-intensity exercise every week. ? 75 minutes of vigorous-intensity exercise every week. ? A mix of moderate-intensity and vigorous-intensity exercise every week.  Children, pregnant women, people who are out of shape, people who are overweight, and older adults may need to consult a health care provider for individual recommendations. If you have any sort of medical condition, be sure to consult your health care provider before starting a new exercise program. What are some activities that can help me to lose weight?  Walking at a rate of at least 4.5 miles an hour.  Jogging or running at a rate of 5 miles per hour.  Biking at a rate of at least 10 miles per hour.  Lap swimming.  Roller-skating or in-line skating.  Cross-country skiing.  Vigorous competitive sports, such as football, basketball, and soccer.  Jumping rope.  Aerobic dancing. How can I be more active in my day-to-day  activities?  Use the stairs instead of the elevator.  Take a walk during your lunch break.  If you drive, park your car farther away from work or school.  If you take public transportation, get off one stop early and walk the rest of the way.  Make all of your phone calls while standing up and walking around.  Get up, stretch, and walk around every 30 minutes throughout the day. What guidelines should I follow while exercising?  Do not exercise so much that you hurt yourself, feel dizzy, or get very short of breath.  Consult your health care provider prior to starting a new exercise program.  Wear comfortable clothes and shoes with good support.  Drink plenty of water while you exercise to prevent dehydration or heat stroke. Body water is lost during exercise and must be replaced.  Work out until you breathe faster and your heart beats faster. This information is not intended to replace advice given to you by your health care provider. Make sure you discuss any questions you have with your health care provider. Document Released: 07/08/2010 Document Revised: 11/11/2015 Document Reviewed: 11/06/2013 Elsevier Interactive Patient Education  2018 [employer]   GREAT JOB on the steady weight loss. Since your BP is at goal and your are losing weight with diet/exercise/medication-we will provide 2 months of phentermine and ask that you follow-up in 8 weeks. Please call clinic with any questions/concerns.

## 2016-11-21 NOTE — Assessment & Plan Note (Signed)
BP at goal today 120/7 Continue Diovan/HCT 160-25mg  1/2 tab daily

## 2016-11-21 NOTE — Assessment & Plan Note (Signed)
GREAT JOB on the steady weight loss. Since your BP is at goal and your are losing weight with diet/exercise/medication-we will provide 2 months of phentermine and ask that you follow-up in 8 weeks. Resume regular cardio training once work hours reduce. Please call clinic with any questions/concerns.

## 2016-11-21 NOTE — Progress Notes (Signed)
Subjective:    Patient ID: Marissa Duarte, female    DOB: [birth year], 56 y.o.   MRN: [id number]  HPI:  Marissa Duarte presents for f/u: medical wt loss. BP at goal today-120/77 Wt 196- total down since beginning wt loss program 08/2016 is 12 lbs, 2 point reduction in BMI-GREAT JOB!!! She continues to eat a heart healthy diet, consume at least 80 ounces a water daily. 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  She plans on resuming more regular exercise in next few weeks.   She feels "great and I know I will hit my goal soon".  Patient Care Team    Relationship Specialty Notifications Start End  Marissa Fusi, NP PCP - General Family Medicine  08/23/16     Patient Active Problem List   Diagnosis Date Noted  . Fatigue 08/23/2016  . Obesity (BMI 35.0-39.9 without comorbidity) 08/23/2016  . Essential hypertension, benign 03/05/2014     Past Medical History:  Diagnosis Date  . Acid reflux   . Esophagitis 3/11    noted on EGD  . Hypertension   . Kidney stone      Past Surgical History:  Procedure Laterality Date  . BREAST LUMPECTOMY  age 19   fibroadenoma  . COLONOSCOPY    . TUBAL LIGATION  1990  . UPPER GASTROINTESTINAL ENDOSCOPY    . WISDOM TOOTH EXTRACTION       Family History  Problem Relation Age of Onset  . Hypertension Father   . Polycystic kidney disease Father   . Cancer Father        lung  . Hypertension Mother   . Infertility Sister   . Hypertension Sister   . Polycystic kidney disease Sister   . Healthy Brother   . Heart attack Maternal Uncle   . Diabetes Maternal Grandfather   . Colon cancer Neg Hx   . Esophageal cancer Neg Hx   . Rectal cancer Neg Hx   . Stomach cancer Neg Hx      History  Drug Use No     History  Alcohol Use  . 7.2 oz/week  . 4 Glasses of wine, 8 Standard drinks or equivalent per week     History   Smoking Status  . Former Smoker  . Packs/day: 0.50  . Years: 20.00  . Types: Cigarettes  . Quit date: 06/19/1996  Smokeless Tobacco  . Never Used     Outpatient Encounter Prescriptions as of 11/21/2016  Medication Sig  . Ascorbic Acid (VITAMIN C) 1000 MG tablet Take 1,000 mg by mouth daily.  . Esomeprazole Magnesium (NEXIUM PO) Take by mouth. OTC- every other day  . Omega-3 Fatty Acids (FISH OIL PO) Take by mouth daily.  Melene Muller ON 12/21/2016] phentermine 37.5 MG capsule Take 1 capsule (37.5 mg total) by mouth every morning.  . valsartan-hydrochlorothiazide (DIOVAN-HCT) 160-25 MG tablet Take 0.5 tablets by mouth daily.  . [DISCONTINUED] phentermine 37.5 MG capsule Take 1 capsule (37.5 mg total) by mouth every morning.  . [DISCONTINUED] phentermine 37.5 MG capsule Take 1 capsule (37.5 mg total) by mouth every morning.   Facility-Administered Encounter Medications as of 11/21/2016  Medication  . 0.9 %  sodium chloride infusion    Allergies: Patient has no known allergies.  Body mass index is 33.21 kg/m.  Blood pressure 120/77, pulse 88, height 5' 4.5" (1.638 m), weight  196 lb 8 oz (89.1 kg), last menstrual period 06/19/2012.    Review of Systems  Constitutional: Negative for activity change, appetite change, chills, diaphoresis, fatigue, fever and unexpected weight change.  Eyes: Negative for visual disturbance.  Respiratory: Negative for cough, chest tightness, shortness of breath, wheezing and stridor.   Cardiovascular: Negative for chest pain, palpitations and leg swelling.  Gastrointestinal: Negative for abdominal distention, abdominal pain, constipation, diarrhea, nausea and vomiting.  Endocrine: Negative for cold intolerance, heat intolerance, polydipsia, polyphagia and polyuria.  Genitourinary: Negative for difficulty urinating and flank pain.  Skin: Negative for color change, pallor, rash and wound.  Allergic/Immunologic: Negative for immunocompromised state.   Neurological: Negative for headaches.  Hematological: Does not bruise/bleed easily.  Psychiatric/Behavioral: Negative for sleep disturbance.       Objective:   Physical Exam  Constitutional: She is oriented to person, place, and time. She appears well-developed and well-nourished. No distress.  HENT:  Head: Normocephalic and atraumatic.  Right Ear: External ear normal.  Left Ear: External ear normal.  Neck: Normal range of motion. Neck supple.  Cardiovascular: Normal rate, regular rhythm, normal heart sounds and intact distal pulses.   No murmur heard. Pulmonary/Chest: Effort normal and breath sounds normal. No respiratory distress. She has no wheezes. She has no rales. She exhibits no tenderness.  Musculoskeletal: Normal range of motion.  Lymphadenopathy:    She has no cervical adenopathy.  Neurological: She is alert and oriented to person, place, and time.  Skin: Skin is warm and dry. No rash noted. She is not diaphoretic. No erythema. No pallor.  Psychiatric: She has a normal mood and affect. Her behavior is normal. Judgment and thought content normal.  Nursing note and vitals reviewed.         Assessment & Plan:   1. Essential hypertension, benign   2. Obesity (BMI 35.0-39.9 without comorbidity)     Essential hypertension, benign BP at goal today 120/7 Continue Diovan/HCT 160-25mg  1/2 tab daily   Obesity (BMI 35.0-39.9 without comorbidity) GREAT JOB on the steady weight loss. Since your BP is at goal and your are losing weight with diet/exercise/medication-we will provide 2 months of phentermine and ask that you follow-up in 8 weeks. Resume regular cardio training once work hours reduce. Please call clinic with any questions/concerns.    FOLLOW-UP:  Return in about 2 years (around 11/22/2018) for Regular Follow Up, Medical Weight Loss.

## 2017-01-02 ENCOUNTER — Encounter: Payer: Self-pay | Admitting: Adult Health

## 2017-01-02 ENCOUNTER — Ambulatory Visit (INDEPENDENT_AMBULATORY_CARE_PROVIDER_SITE_OTHER): Payer: No Typology Code available for payment source | Admitting: Adult Health

## 2017-01-02 DIAGNOSIS — E669 Obesity, unspecified: Secondary | ICD-10-CM | POA: Diagnosis not present

## 2017-01-02 MED ORDER — PHENTERMINE HCL 37.5 MG PO CAPS: 37.5000 mg | ORAL_CAPSULE | ORAL | 0 refills | Status: DC

## 2017-01-02 NOTE — Patient Instructions (Signed)
Heart-Healthy Eating Plan Many factors influence your heart health, including eating and exercise habits. Heart (coronary) risk increases with abnormal blood fat (lipid) levels. Heart-healthy meal planning includes limiting unhealthy fats, increasing healthy fats, and making other small dietary changes. This includes maintaining a healthy body weight to help keep lipid levels within a normal range. What is my plan? Your health care provider recommends that you:  Get no more than _________% of the total calories in your daily diet from fat.  Limit your intake of saturated fat to less than _________% of your total calories each day.  Limit the amount of cholesterol in your diet to less than _________ mg per day.  What types of fat should I choose?  Choose healthy fats more often. Choose monounsaturated and polyunsaturated fats, such as olive oil and canola oil, flaxseeds, walnuts, almonds, and seeds.  Eat more omega-3 fats. Good choices include salmon, mackerel, sardines, tuna, flaxseed oil, and ground flaxseeds. Aim to eat fish at least two times each week.  Limit saturated fats. Saturated fats are primarily found in animal products, such as meats, butter, and cream. Plant sources of saturated fats include palm oil, palm kernel oil, and coconut oil.  Avoid foods with partially hydrogenated oils in them. These contain trans fats. Examples of foods that contain trans fats are stick margarine, some tub margarines, cookies, crackers, and other baked goods. What general guidelines do I need to follow?  Check food labels carefully to identify foods with trans fats or high amounts of saturated fat.  Fill one half of your plate with vegetables and green salads. Eat 4-5 servings of vegetables per day. A serving of vegetables equals 1 cup of raw leafy vegetables,  cup of raw or cooked cut-up vegetables, or  cup of vegetable juice.  Fill one fourth of your plate with whole grains. Look for the word  "whole" as the first word in the ingredient list.  Fill one fourth of your plate with lean protein foods.  Eat 4-5 servings of fruit per day. A serving of fruit equals one medium whole fruit,  cup of dried fruit,  cup of fresh, frozen, or canned fruit, or  cup of 100% fruit juice.  Eat more foods that contain soluble fiber. Examples of foods that contain this type of fiber are apples, broccoli, carrots, beans, peas, and barley. Aim to get 20-30 g of fiber per day.  Eat more home-cooked food and less restaurant, buffet, and fast food.  Limit or avoid alcohol.  Limit foods that are high in starch and sugar.  Avoid fried foods.  Cook foods by using methods other than frying. Baking, boiling, grilling, and broiling are all great options. Other fat-reducing suggestions include: ? Removing the skin from poultry. ? Removing all visible fats from meats. ? Skimming the fat off of stews, soups, and gravies before serving them. ? Steaming vegetables in water or broth.  Lose weight if you are overweight. Losing just 5-10% of your initial body weight can help your overall health and prevent diseases such as diabetes and heart disease.  Increase your consumption of nuts, legumes, and seeds to 4-5 servings per week. One serving of dried beans or legumes equals  cup after being cooked, one serving of nuts equals 1 ounces, and one serving of seeds equals  ounce or 1 tablespoon.  You may need to monitor your salt (sodium) intake, especially if you have high blood pressure. Talk with your health care provider or dietitian to get  more information about reducing sodium. What foods can I eat? Grains  Breads, including French, white, pita, wheat, raisin, rye, oatmeal, and Italian. Tortillas that are neither fried nor made with lard or trans fat. Low-fat rolls, including hotdog and hamburger buns and English muffins. Biscuits. Muffins. Waffles. Pancakes. Light popcorn. Whole-grain cereals. Flatbread.  Melba toast. Pretzels. Breadsticks. Rusks. Low-fat snacks and crackers, including oyster, saltine, matzo, graham, animal, and rye. Rice and pasta, including brown rice and those that are made with whole wheat. Vegetables All vegetables. Fruits All fruits, but limit coconut. Meats and Other Protein Sources Lean, well-trimmed beef, veal, pork, and lamb. Chicken and turkey without skin. All fish and shellfish. Wild duck, rabbit, pheasant, and venison. Egg whites or low-cholesterol egg substitutes. Dried beans, peas, lentils, and tofu.Seeds and most nuts. Dairy Low-fat or nonfat cheeses, including ricotta, string, and mozzarella. Skim or 1% milk that is liquid, powdered, or evaporated. Buttermilk that is made with low-fat milk. Nonfat or low-fat yogurt. Beverages Mineral water. Diet carbonated beverages. Sweets and Desserts Sherbets and fruit ices. Honey, jam, marmalade, jelly, and syrups. Meringues and gelatins. Pure sugar candy, such as hard candy, jelly beans, gumdrops, mints, marshmallows, and small amounts of dark chocolate. Angel food cake. Eat all sweets and desserts in moderation. Fats and Oils Nonhydrogenated (trans-free) margarines. Vegetable oils, including soybean, sesame, sunflower, olive, peanut, safflower, corn, canola, and cottonseed. Salad dressings or mayonnaise that are made with a vegetable oil. Limit added fats and oils that you use for cooking, baking, salads, and as spreads. Other Cocoa powder. Coffee and tea. All seasonings and condiments. The items listed above may not be a complete list of recommended foods or beverages. Contact your dietitian for more options. What foods are not recommended? Grains Breads that are made with saturated or trans fats, oils, or whole milk. Croissants. Butter rolls. Cheese breads. Sweet rolls. Donuts. Buttered popcorn. Chow mein noodles. High-fat crackers, such as cheese or butter crackers. Meats and Other Protein Sources Fatty meats, such  as hotdogs, short ribs, sausage, spareribs, bacon, ribeye roast or steak, and mutton. High-fat deli meats, such as salami and bologna. Caviar. Domestic duck and goose. Organ meats, such as kidney, liver, sweetbreads, brains, gizzard, chitterlings, and heart. Dairy Cream, sour cream, cream cheese, and creamed cottage cheese. Whole milk cheeses, including blue (bleu), Monterey Jack, Brie, [place], American, Havarti, Swiss, cheddar, Camembert, and [place]. Whole or 2% milk that is liquid, evaporated, or condensed. Whole buttermilk. Cream sauce or high-fat cheese sauce. Yogurt that is made from whole milk. Beverages Regular sodas and drinks with added sugar. Sweets and Desserts Frosting. Pudding. Cookies. Cakes other than angel food cake. Candy that has milk chocolate or white chocolate, hydrogenated fat, butter, coconut, or unknown ingredients. Buttered syrups. Full-fat ice cream or ice cream drinks. Fats and Oils Gravy that has suet, meat fat, or shortening. Cocoa butter, hydrogenated oils, palm oil, coconut oil, palm kernel oil. These can often be found in baked products, candy, fried foods, nondairy creamers, and whipped toppings. Solid fats and shortenings, including bacon fat, salt pork, lard, and butter. Nondairy cream substitutes, such as coffee creamers and sour cream substitutes. Salad dressings that are made of unknown oils, cheese, or sour cream. The items listed above may not be a complete list of foods and beverages to avoid. Contact your dietitian for more information. This information is not intended to replace advice given to you by your health care provider. Make sure you discuss any questions you have with your health care   provider. Document Released: 03/14/2008 Document Revised: 12/24/2015 Document Reviewed: 11/27/2013 Elsevier Interactive Patient Education  2017 [employer]  GREAT JOB! Follow-up in 2 months, will come off medication at that time to allow organic eating habits to  emerge. Increase regular exercise-walking, biking, Pinterest/YouTube videos.

## 2017-01-02 NOTE — Assessment & Plan Note (Addendum)
Wt at last OV June 2018- 196# Wt today 191# Down 5 lbs Diet is excellent! Increase regular movement-biking, walking, YouTube/Pinterest Videos. Excellent water intake >90 ounces/day. Provided 2 more months of phentermine Rx-[place] Controlled Subst Database verified-no contraindications indicated. Phentermine started April 2018, after these 2 Rxs will be total of 6 months of medical weight loss. Will stop med after next OV to allow for organic eating habits to re-emerge.

## 2017-01-02 NOTE — Progress Notes (Signed)
Subjective:    Patient ID: Marissa Duarte, female    DOB: 07/03/1960, 56 y.o.   MRN: [id number]  HPI:  Marissa Duarte is here for f/u: medical wt loss and phentermine Rx.  She has lost 5 lbs since last OV-GREAT! Diet is excellent-fruits, vegetables, lean protein, sparingly CHO and > 90 ounces water/day.  Exercise needs to increase, currently only biking/walking 2 days/week. She continues to avoid tobacco and consumes EOTH on weekends only.  Patient Care Team    Relationship Specialty Notifications Start End  Marissa Duarte, Marissa Zemanek D, NP PCP - General Family Medicine  08/23/16     Patient Active Problem List   Diagnosis Date Noted  . Fatigue 08/23/2016  . Obesity (BMI 35.0-39.9 without comorbidity) 08/23/2016  . Essential hypertension, benign 03/05/2014     Past Medical History:  Diagnosis Date  . Acid reflux   . Esophagitis 3/11    noted on EGD  . Hypertension   . Kidney stone      Past Surgical History:  Procedure Laterality Date  . BREAST LUMPECTOMY  age 56   fibroadenoma  . COLONOSCOPY    . TUBAL LIGATION  1990  . UPPER GASTROINTESTINAL ENDOSCOPY    . WISDOM TOOTH EXTRACTION       Family History  Problem Relation Age of Onset  . Hypertension Father   . Polycystic kidney disease Father   . Cancer Father        lung  . Hypertension Mother   . Infertility Sister   . Hypertension Sister   . Polycystic kidney disease Sister   . Healthy Brother   . Heart attack Maternal Uncle   . Diabetes Maternal Grandfather   . Colon cancer Neg Hx   . Esophageal cancer Neg Hx   . Rectal cancer Neg Hx   . Stomach cancer Neg Hx      History  Drug Use No     History  Alcohol Use  . 7.2 oz/week  . 4 Glasses of wine, 8 Standard drinks or equivalent per week     History  Smoking Status  . Former Smoker  . Packs/day: 0.50  . Years: 20.00  . Types: Cigarettes  . Quit date: 06/19/1996  Smokeless Tobacco  . Never Used     Outpatient Encounter Prescriptions as of 01/02/2017   Medication Sig  . Ascorbic Acid (VITAMIN C) 1000 MG tablet Take 1,000 mg by mouth daily.  . Esomeprazole Magnesium (NEXIUM PO) Take by mouth. OTC- every other day  . Omega-3 Fatty Acids (FISH OIL PO) Take by mouth daily.  . phentermine 37.5 MG capsule Take 1 capsule (37.5 mg total) by mouth every morning.  Melene Muller. [START ON 02/02/2017] phentermine 37.5 MG capsule Take 1 capsule (37.5 mg total) by mouth every morning.  . valsartan-hydrochlorothiazide (DIOVAN-HCT) 160-25 MG tablet Take 0.5 tablets by mouth daily.  . [DISCONTINUED] phentermine 37.5 MG capsule Take 1 capsule (37.5 mg total) by mouth every morning.   Facility-Administered Encounter Medications as of 01/02/2017  Medication  . 0.9 %  sodium chloride infusion    Allergies: Patient has no known allergies.  Body mass index is 32.28 kg/m.  Blood pressure 137/84, pulse 84, height 5' 4.5" (1.638 m), weight 191 lb (86.6 kg), last menstrual period 06/19/2012.     Review of Systems  Constitutional: Negative for activity change, appetite change, chills, diaphoresis, fatigue, fever and unexpected weight change.  Eyes: Negative for visual disturbance.  Respiratory: Negative for cough, chest tightness, shortness of  breath and wheezing.   Cardiovascular: Negative for chest pain, palpitations and leg swelling.  Gastrointestinal: Negative for abdominal distention, abdominal pain, blood in stool, constipation, diarrhea, nausea and vomiting.  Endocrine: Negative for cold intolerance, heat intolerance, polydipsia, polyphagia and polyuria.  Genitourinary: Negative for difficulty urinating and flank pain.  Musculoskeletal: Negative for arthralgias, gait problem and myalgias.  Skin: Negative for color change, pallor, rash and wound.  Allergic/Immunologic: Negative for immunocompromised state.  Neurological: Negative for dizziness.  Hematological: Does not bruise/bleed easily.  Psychiatric/Behavioral: Negative for sleep disturbance. The patient is  not nervous/anxious and is not hyperactive.        Objective:   Physical Exam  Constitutional: She is oriented to person, place, and time. She appears well-developed and well-nourished. No distress.  HENT:  Head: Normocephalic and atraumatic.  Right Ear: External ear normal.  Left Ear: External ear normal.  Eyes: Pupils are equal, round, and reactive to light. Conjunctivae are normal.  Neck: Normal range of motion. Neck supple.  Cardiovascular: Normal rate, regular rhythm, normal heart sounds and intact distal pulses.   No murmur heard. Pulmonary/Chest: Effort normal and breath sounds normal. No respiratory distress. She has no wheezes. She has no rales. She exhibits no tenderness.  Lymphadenopathy:    She has no cervical adenopathy.  Neurological: She is alert and oriented to person, place, and time. Coordination normal.  Skin: Skin is warm and dry. No rash noted. She is not diaphoretic. No erythema. No pallor.  Psychiatric: She has a normal mood and affect. Her behavior is normal. Judgment and thought content normal.  Nursing note and vitals reviewed.         Assessment & Plan:   1. Obesity (BMI 35.0-39.9 without comorbidity)     Obesity (BMI 35.0-39.9 without comorbidity) Wt at last OV June 2018- 196# Wt today 191# Down 5 lbs Diet is excellent! Increase regular movement-biking, walking, YouTube/Pinterest Videos. Excellent water intake >90 ounces/day. Provided 2 more months of phentermine Rx-[place] Controlled Subst Database verified-no contraindications indicated. Phentermine started April 2018, after these 2 Rxs will be total of 6 months of medical weight loss. Will stop med after next OV to allow for organic eating habits to re-emerge.    FOLLOW-UP:  Return in about 2 months (around 03/05/2017) for Regular Follow Up, Medical Weight Loss.

## 2017-01-09 ENCOUNTER — Encounter: Payer: No Typology Code available for payment source | Admitting: Family Medicine

## 2017-03-05 ENCOUNTER — Encounter: Payer: Self-pay | Admitting: Adult Health

## 2017-03-05 ENCOUNTER — Ambulatory Visit (INDEPENDENT_AMBULATORY_CARE_PROVIDER_SITE_OTHER): Payer: No Typology Code available for payment source | Admitting: Adult Health

## 2017-03-05 DIAGNOSIS — E669 Obesity, unspecified: Secondary | ICD-10-CM

## 2017-03-05 DIAGNOSIS — I10 Essential (primary) hypertension: Secondary | ICD-10-CM | POA: Diagnosis not present

## 2017-03-05 MED ORDER — PHENTERMINE HCL 37.5 MG PO CAPS: 37.5000 mg | ORAL_CAPSULE | ORAL | 0 refills | Status: DC

## 2017-03-05 NOTE — Patient Instructions (Signed)
Heart-Healthy Eating Plan Many factors influence your heart health, including eating and exercise habits. Heart (coronary) risk increases with abnormal blood fat (lipid) levels. Heart-healthy meal planning includes limiting unhealthy fats, increasing healthy fats, and making other small dietary changes. This includes maintaining a healthy body weight to help keep lipid levels within a normal range. What is my plan? Your health care provider recommends that you:  Get no more than __25__% of the total calories in your daily diet from fat.  Limit your intake of saturated fat to less than ___5__% of your total calories each day.  Limit the amount of cholesterol in your diet to less than __300__ mg per day.  What types of fat should I choose?  Choose healthy fats more often. Choose monounsaturated and polyunsaturated fats, such as olive oil and canola oil, flaxseeds, walnuts, almonds, and seeds.  Eat more omega-3 fats. Good choices include salmon, mackerel, sardines, tuna, flaxseed oil, and ground flaxseeds. Aim to eat fish at least two times each week.  Limit saturated fats. Saturated fats are primarily found in animal products, such as meats, butter, and cream. Plant sources of saturated fats include palm oil, palm kernel oil, and coconut oil.  Avoid foods with partially hydrogenated oils in them. These contain trans fats. Examples of foods that contain trans fats are stick margarine, some tub margarines, cookies, crackers, and other baked goods. What general guidelines do I need to follow?  Check food labels carefully to identify foods with trans fats or high amounts of saturated fat.  Fill one half of your plate with vegetables and green salads. Eat 4-5 servings of vegetables per day. A serving of vegetables equals 1 cup of raw leafy vegetables,  cup of raw or cooked cut-up vegetables, or  cup of vegetable juice.  Fill one fourth of your plate with whole grains. Look for the word "whole"  as the first word in the ingredient list.  Fill one fourth of your plate with lean protein foods.  Eat 4-5 servings of fruit per day. A serving of fruit equals one medium whole fruit,  cup of dried fruit,  cup of fresh, frozen, or canned fruit, or  cup of 100% fruit juice.  Eat more foods that contain soluble fiber. Examples of foods that contain this type of fiber are apples, broccoli, carrots, beans, peas, and barley. Aim to get 20-30 g of fiber per day.  Eat more home-cooked food and less restaurant, buffet, and fast food.  Limit or avoid alcohol.  Limit foods that are high in starch and sugar.  Avoid fried foods.  Cook foods by using methods other than frying. Baking, boiling, grilling, and broiling are all great options. Other fat-reducing suggestions include: ? Removing the skin from poultry. ? Removing all visible fats from meats. ? Skimming the fat off of stews, soups, and gravies before serving them. ? Steaming vegetables in water or broth.  Lose weight if you are overweight. Losing just 5-10% of your initial body weight can help your overall health and prevent diseases such as diabetes and heart disease.  Increase your consumption of nuts, legumes, and seeds to 4-5 servings per week. One serving of dried beans or legumes equals  cup after being cooked, one serving of nuts equals 1 ounces, and one serving of seeds equals  ounce or 1 tablespoon.  You may need to monitor your salt (sodium) intake, especially if you have high blood pressure. Talk with your health care provider or dietitian to get  more information about reducing sodium. What foods can I eat? Grains  Breads, including [place], white, pita, wheat, raisin, rye, oatmeal, and [place]. Tortillas that are neither fried nor made with lard or trans fat. Low-fat rolls, including hotdog and hamburger buns and English muffins. Biscuits. Muffins. Waffles. Pancakes. Light popcorn. Whole-grain cereals. Flatbread. Melba  toast. Pretzels. Breadsticks. Rusks. Low-fat snacks and crackers, including oyster, saltine, matzo, graham, animal, and rye. Rice and pasta, including brown rice and those that are made with whole wheat. Vegetables All vegetables. Fruits All fruits, but limit coconut. Meats and Other Protein Sources Lean, well-trimmed beef, veal, pork, and lamb. Chicken and [place] without skin. All fish and shellfish. Wild duck, rabbit, pheasant, and venison. Egg whites or low-cholesterol egg substitutes. Dried beans, peas, lentils, and tofu.Seeds and most nuts. Dairy Low-fat or nonfat cheeses, including ricotta, string, and mozzarella. Skim or 1% milk that is liquid, powdered, or evaporated. Buttermilk that is made with low-fat milk. Nonfat or low-fat yogurt. Beverages Mineral water. Diet carbonated beverages. Sweets and Desserts Sherbets and fruit ices. Honey, jam, marmalade, jelly, and syrups. Meringues and gelatins. Pure sugar candy, such as hard candy, jelly beans, gumdrops, mints, marshmallows, and small amounts of dark chocolate. [employer] Eat all sweets and desserts in moderation. Fats and Oils Nonhydrogenated (trans-free) margarines. Vegetable oils, including soybean, sesame, sunflower, olive, peanut, safflower, corn, canola, and cottonseed. Salad dressings or mayonnaise that are made with a vegetable oil. Limit added fats and oils that you use for cooking, baking, salads, and as spreads. Other Cocoa powder. Coffee and tea. All seasonings and condiments. The items listed above may not be a complete list of recommended foods or beverages. Contact your dietitian for more options. What foods are not recommended? Grains Breads that are made with saturated or trans fats, oils, or whole milk. Croissants. Butter rolls. Cheese breads. Sweet rolls. Donuts. Buttered popcorn. Chow mein noodles. High-fat crackers, such as cheese or butter crackers. Meats and Other Protein Sources Fatty meats, such as  hotdogs, short ribs, sausage, spareribs, bacon, ribeye roast or steak, and mutton. High-fat deli meats, such as salami and bologna. Caviar. Domestic duck and goose. Organ meats, such as kidney, liver, sweetbreads, brains, gizzard, chitterlings, and heart. Dairy Cream, sour cream, cream cheese, and creamed cottage cheese. Whole milk cheeses, including blue (bleu), Monterey Jack, [place], [place], American, [place], Swiss, [place], [place], and [place]. Whole or 2% milk that is liquid, evaporated, or condensed. Whole buttermilk. Cream sauce or high-fat cheese sauce. Yogurt that is made from whole milk. Beverages Regular sodas and drinks with added sugar. Sweets and Desserts Frosting. Pudding. Cookies. Cakes other than angel food cake. Candy that has milk chocolate or white chocolate, hydrogenated fat, butter, coconut, or unknown ingredients. Buttered syrups. Full-fat ice cream or ice cream drinks. Fats and Oils Gravy that has suet, meat fat, or shortening. Cocoa butter, hydrogenated oils, palm oil, coconut oil, palm kernel oil. These can often be found in baked products, candy, fried foods, nondairy creamers, and whipped toppings. Solid fats and shortenings, including bacon fat, salt pork, lard, and butter. Nondairy cream substitutes, such as coffee creamers and sour cream substitutes. Salad dressings that are made of unknown oils, cheese, or sour cream. The items listed above may not be a complete list of foods and beverages to avoid. Contact your dietitian for more information. This information is not intended to replace advice given to you by your health care provider. Make sure you discuss any questions you have with your health care  provider. Document Released: 03/14/2008 Document Revised: 12/24/2015 Document Reviewed: 11/27/2013 Elsevier Interactive Patient Education  2017 [employer].   Exercising to [employer] Exercising can help you to lose weight. In order to lose weight through exercise,  you need to do vigorous-intensity exercise. You can tell that you are exercising with vigorous intensity if you are breathing very hard and fast and cannot hold a conversation while exercising. Moderate-intensity exercise helps to maintain your current weight. You can tell that you are exercising at a moderate level if you have a higher heart rate and faster breathing, but you are still able to hold a conversation. How often should I exercise? Choose an activity that you enjoy and set realistic goals. Your health care provider can help you to make an activity plan that works for you. Exercise regularly as directed by your health care provider. This may include:  Doing resistance training twice each week, such as: ? Push-ups. ? Sit-ups. ? Lifting weights. ? Using resistance bands.  Doing a given intensity of exercise for a given amount of time. Choose from these options: ? 150 minutes of moderate-intensity exercise every week. ? 75 minutes of vigorous-intensity exercise every week. ? A mix of moderate-intensity and vigorous-intensity exercise every week.  Children, pregnant women, people who are out of shape, people who are overweight, and older adults may need to consult a health care provider for individual recommendations. If you have any sort of medical condition, be sure to consult your health care provider before starting a new exercise program. What are some activities that can help me to lose weight?  Walking at a rate of at least 4.5 miles an hour.  Jogging or running at a rate of 5 miles per hour.  Biking at a rate of at least 10 miles per hour.  Lap swimming.  Roller-skating or in-line skating.  Cross-country skiing.  Vigorous competitive sports, such as football, basketball, and soccer.  Jumping rope.  Aerobic dancing. How can I be more active in my day-to-day activities?  Use the stairs instead of the elevator.  Take a walk during your lunch break.  If you drive,  park your car farther away from work or school.  If you take public transportation, get off one stop early and walk the rest of the way.  Make all of your phone calls while standing up and walking around.  Get up, stretch, and walk around every 30 minutes throughout the day. What guidelines should I follow while exercising?  Do not exercise so much that you hurt yourself, feel dizzy, or get very short of breath.  Consult your health care provider prior to starting a new exercise program.  Wear comfortable clothes and shoes with good support.  Drink plenty of water while you exercise to prevent dehydration or heat stroke. Body water is lost during exercise and must be replaced.  Work out until you breathe faster and your heart beats faster. This information is not intended to replace advice given to you by your health care provider. Make sure you discuss any questions you have with your health care provider. Document Released: 07/08/2010 Document Revised: 11/11/2015 Document Reviewed: 11/06/2013 Elsevier Interactive Patient Education  2018 [employer].  GREAT JOB!!!! Continue with cardio training-biking and treadmill.  Recommend adding in weight training twice a week for 15-74mins, ie. YouTube and Pinterest workout videos. 6 month of Phentermine provided, then will stop medication. Please schedule complete physical for this fall. GREAT GREAT GREAT JOB!

## 2017-03-05 NOTE — Progress Notes (Signed)
Subjective:    Patient ID: Marissa Duarte, female    DOB: Jul 01, 1960, 56 y.o.   MRN: [id number]  HPI:  Marissa Duarte presents for f/u: medical wt loss. This will be the 6 month on Phentermine 37.5mg , she has been taking the medication each morning and denies SE.  She preforms cardio training 3 days a week- on either bike of treadmill.  Daily diet is typically- fruit with whole grains for breakfast, fruit snake mid-morning, lunch/dinner lean protein with vegetables.  She estimates to drink >80 ounces water/day and does not use tobacco. She consumes a glass of wine several evenings a week. She feels "wonderful and my clothes fit better than they have in years".  Patient Care Team    Relationship Specialty Notifications Start End  Julaine Fusi, NP PCP - General Family Medicine  08/23/16     Patient Active Problem List   Diagnosis Date Noted  . Fatigue 08/23/2016  . Obesity (BMI 35.0-39.9 without comorbidity) 08/23/2016  . Essential hypertension, benign 03/05/2014     Past Medical History:  Diagnosis Date  . Acid reflux   . Esophagitis 3/11    noted on EGD  . Hypertension   . Kidney stone      Past Surgical History:  Procedure Laterality Date  . BREAST LUMPECTOMY  age 47   fibroadenoma  . COLONOSCOPY    . TUBAL LIGATION  1990  . UPPER GASTROINTESTINAL ENDOSCOPY    . WISDOM TOOTH EXTRACTION       Family History  Problem Relation Age of Onset  . Hypertension Father   . Polycystic kidney disease Father   . Cancer Father        lung  . Hypertension Mother   . Infertility Sister   . Hypertension Sister   . Polycystic kidney disease Sister   . Healthy Brother   . Heart attack Maternal Uncle   . Diabetes Maternal Grandfather   . Colon cancer Neg Hx   . Esophageal cancer Neg Hx   . Rectal cancer Neg Hx   . Stomach cancer Neg Hx      History  Drug Use No     History  Alcohol Use  . 7.2 oz/week  . 4 Glasses of wine, 8 Standard drinks or equivalent per  week     History  Smoking Status  . Former Smoker  . Packs/day: 0.50  . Years: 20.00  . Types: Cigarettes  . Quit date: 06/19/1996  Smokeless Tobacco  . Never Used     Outpatient Encounter Prescriptions as of 03/05/2017  Medication Sig  . Ascorbic Acid (VITAMIN C) 1000 MG tablet Take 1,000 mg by mouth daily.  . Esomeprazole Magnesium (NEXIUM PO) Take by mouth. OTC- every other day  . Omega-3 Fatty Acids (FISH OIL PO) Take by mouth daily.  . phentermine 37.5 MG capsule Take 1 capsule (37.5 mg total) by mouth every morning.  . [DISCONTINUED] phentermine 37.5 MG capsule Take 1 capsule (37.5 mg total) by mouth every morning.  . [DISCONTINUED] phentermine 37.5 MG capsule Take 1 capsule (37.5 mg total) by mouth every morning.  . valsartan-hydrochlorothiazide (DIOVAN-HCT) 160-25 MG tablet Take 0.5 tablets by mouth daily.  . [DISCONTINUED] 0.9 %  sodium chloride infusion    No facility-administered encounter medications on file as of 03/05/2017.     Allergies: Patient has no known allergies.  Body mass index is 31.72 kg/m.  Blood pressure 127/81, pulse 82, height 5' 4.5" (1.638 m), weight 187  lb 11.2 oz (85.1 kg), last menstrual period 06/19/2012.     Review of Systems  Constitutional: Positive for fatigue. Negative for activity change, appetite change, chills, diaphoresis, fever and unexpected weight change.  Eyes: Negative for visual disturbance.  Respiratory: Negative for cough, chest tightness, shortness of breath, wheezing and stridor.   Cardiovascular: Negative for chest pain, palpitations and leg swelling.  Gastrointestinal: Negative for abdominal distention, abdominal pain, blood in stool, constipation, diarrhea, nausea and vomiting.  Endocrine: Negative for cold intolerance, heat intolerance, polydipsia, polyphagia and polyuria.  Neurological: Negative for headaches.  Psychiatric/Behavioral: Negative for sleep disturbance.       Objective:   Physical Exam   Constitutional: She is oriented to person, place, and time. She appears well-developed and well-nourished. No distress.  HENT:  Head: Normocephalic and atraumatic.  Right Ear: External ear normal.  Left Ear: External ear normal.  Cardiovascular: Normal rate, regular rhythm, normal heart sounds and intact distal pulses.   No murmur heard. Pulmonary/Chest: Effort normal and breath sounds normal. No respiratory distress. She has no wheezes. She has no rales. She exhibits no tenderness.  Neurological: She is alert and oriented to person, place, and time.  Skin: Skin is warm and dry. No rash noted. She is not diaphoretic. No erythema. No pallor.  Psychiatric: She has a normal mood and affect. Her behavior is normal. Judgment and thought content normal.  Nursing note and vitals reviewed.         Assessment & Plan:   1. Obesity (BMI 35.0-39.9 without comorbidity)   2. Essential hypertension, benign     Obesity (BMI 35.0-39.9 without comorbidity) BP 127/81, HR 82 Wt 187 4 lb wt loss since last OV Total of 23 lbs lost since starting med wt loss-GREAT! Cardio training 3 days a week- of either bike/treadmill. Recommend incorporating wt training at least 2 days a week-YouTube and Pinterest workout videos.  This is 6 month on Phentermine, will stop after this month. Recommend CPE this fall/winter.  Essential hypertension, benign BP at goal 127/81, HR 82 Continue Valsartan/HCTZ 160/25mg  daily.    FOLLOW-UP:  Return in about 3 months (around 06/04/2017) for CPE.

## 2017-03-05 NOTE — Assessment & Plan Note (Addendum)
BP 127/81, HR 82 Wt 187 4 lb wt loss since last OV Total of 23 lbs lost since starting med wt loss-GREAT! Cardio training 3 days a week- of either bike/treadmill. Recommend incorporating wt training at least 2 days a week-YouTube and Pinterest workout videos.  This is 6 month on Phentermine, will stop after this month. Recommend CPE this fall/winter.

## 2017-03-05 NOTE — Assessment & Plan Note (Signed)
BP at goal 127/81, HR 82 Continue Valsartan/HCTZ 160/25mg  daily.

## 2017-10-18 LAB — HM MAMMOGRAPHY

## 2017-10-21 ENCOUNTER — Other Ambulatory Visit: Payer: Self-pay | Admitting: Adult Health

## 2017-10-23 ENCOUNTER — Other Ambulatory Visit: Payer: Self-pay

## 2017-10-23 ENCOUNTER — Other Ambulatory Visit: Payer: No Typology Code available for payment source

## 2017-10-23 DIAGNOSIS — I10 Essential (primary) hypertension: Secondary | ICD-10-CM

## 2017-10-23 DIAGNOSIS — Z0000 Encounter for general adult medical examination without abnormal findings: Secondary | ICD-10-CM

## 2017-10-23 DIAGNOSIS — Z833 Family history of diabetes mellitus: Secondary | ICD-10-CM

## 2017-10-24 LAB — CBC WITH DIFFERENTIAL/PLATELET
Basophils Absolute: 0 10*3/uL (ref 0.0–0.2)
Basos: 1 %
EOS (ABSOLUTE): 0.2 10*3/uL (ref 0.0–0.4)
EOS: 5 %
HEMATOCRIT: 33.7 % — AB (ref 34.0–46.6)
Hemoglobin: 10.8 g/dL — ABNORMAL LOW (ref 11.1–15.9)
IMMATURE GRANS (ABS): 0 10*3/uL (ref 0.0–0.1)
Immature Granulocytes: 0 %
Lymphocytes Absolute: 1.2 10*3/uL (ref 0.7–3.1)
Lymphs: 29 %
MCH: 28.4 pg (ref 26.6–33.0)
MCHC: 32 g/dL (ref 31.5–35.7)
MCV: 89 fL (ref 79–97)
MONOCYTES: 9 %
Monocytes Absolute: 0.4 10*3/uL (ref 0.1–0.9)
Neutrophils Absolute: 2.4 10*3/uL (ref 1.4–7.0)
Neutrophils: 56 %
Platelets: 227 10*3/uL (ref 150–379)
RBC: 3.8 x10E6/uL (ref 3.77–5.28)
RDW: 12.7 % (ref 12.3–15.4)
WBC: 4.2 10*3/uL (ref 3.4–10.8)

## 2017-10-24 LAB — TSH: TSH: 2.39 u[IU]/mL (ref 0.450–4.500)

## 2017-10-24 LAB — COMPREHENSIVE METABOLIC PANEL
ALBUMIN: 3.8 g/dL (ref 3.5–5.5)
ALK PHOS: 56 IU/L (ref 39–117)
ALT: 13 IU/L (ref 0–32)
AST: 12 IU/L (ref 0–40)
Albumin/Globulin Ratio: 2.2 (ref 1.2–2.2)
BUN/Creatinine Ratio: 20 (ref 9–23)
BUN: 13 mg/dL (ref 6–24)
Bilirubin Total: 0.5 mg/dL (ref 0.0–1.2)
CO2: 24 mmol/L (ref 20–29)
CREATININE: 0.66 mg/dL (ref 0.57–1.00)
Calcium: 8.7 mg/dL (ref 8.7–10.2)
Chloride: 102 mmol/L (ref 96–106)
GFR calc Af Amer: 112 mL/min/{1.73_m2} (ref >59)
GFR calc non Af Amer: 97 mL/min/{1.73_m2} (ref >59)
GLUCOSE: 101 mg/dL — AB (ref 65–99)
Globulin, Total: 1.7 g/dL (ref 1.5–4.5)
Potassium: 4.3 mmol/L (ref 3.5–5.2)
Sodium: 140 mmol/L (ref 134–144)
Total Protein: 5.5 g/dL — ABNORMAL LOW (ref 6.0–8.5)

## 2017-10-24 LAB — LIPID PANEL
CHOL/HDL RATIO: 4 ratio (ref 0.0–4.4)
Cholesterol, Total: 175 mg/dL (ref 100–199)
HDL: 44 mg/dL (ref >39)
LDL Calculated: 108 mg/dL — ABNORMAL HIGH (ref 0–99)
TRIGLYCERIDES: 113 mg/dL (ref 0–149)
VLDL Cholesterol Cal: 23 mg/dL (ref 5–40)

## 2017-10-24 LAB — HEMOGLOBIN A1C
Est. average glucose Bld gHb Est-mCnc: 105 mg/dL
HEMOGLOBIN A1C: 5.3 % (ref 4.8–5.6)

## 2017-10-26 ENCOUNTER — Ambulatory Visit: Payer: PRIVATE HEALTH INSURANCE | Admitting: Obstetrics & Gynecology

## 2017-10-26 ENCOUNTER — Encounter: Payer: Self-pay | Admitting: Obstetrics & Gynecology

## 2017-10-26 ENCOUNTER — Encounter

## 2017-10-26 VITALS — BP 138/80 | HR 92 | Resp 16 | Ht 63.75 in | Wt 192.0 lb

## 2017-10-26 DIAGNOSIS — Z01419 Encounter for gynecological examination (general) (routine) without abnormal findings: Secondary | ICD-10-CM | POA: Diagnosis not present

## 2017-10-26 NOTE — Progress Notes (Signed)
58 y.o. G2P2 MarriedCaucasianF here for annual exam.  Working on losing weight.  Doing weight watchers at work.    Denies vaginal bleeding.     Saw dermatology this year.  Had no abnormal findings.    PCP:  William Hamburger.  Had blood work done this week.  Has physical scheduled next weke.    Patient's last menstrual period was 06/19/2012.          Sexually active: Yes.    The current method of family planning is tubal ligation.    Exercising: Yes.    walking, bike Smoker:  no  Health Maintenance:  Pap:  07/28/16 Neg. HR HPV:neg   03/05/14 Neg  History of abnormal Pap:  no MMG:  10/11/16 BIRADS2:benign. Had one 09/2017  Colonoscopy:  09/28/16 f/u 10 years BMD:  Never TDaP:  2017 Pneumonia vaccine(s):  n/a Shingrix:   No Hep C testing: 09/08/16 neg  Screening Labs: PCP   reports that she quit smoking about 21 years ago. Her smoking use included cigarettes. She has a 10.00 pack-year smoking history. She has never used smokeless tobacco. She reports that she drinks about 7.2 oz of alcohol per week. She reports that she does not use drugs.  Past Medical History:  Diagnosis Date  . Acid reflux   . Esophagitis 3/11    noted on EGD  . Hypertension   . Kidney stone     Past Surgical History:  Procedure Laterality Date  . BREAST LUMPECTOMY  age 55   fibroadenoma  . COLONOSCOPY    . TUBAL LIGATION  1990  . UPPER GASTROINTESTINAL ENDOSCOPY    . WISDOM TOOTH EXTRACTION      Current Outpatient Medications  Medication Sig Dispense Refill  . Ascorbic Acid (VITAMIN C) 1000 MG tablet Take 1,000 mg by mouth daily.    Marland Kitchen desonide (DESOWEN) 0.05 % ointment daily as needed.  1  . Esomeprazole Magnesium (NEXIUM PO) Take by mouth. OTC- every other day    . Omega-3 Fatty Acids (FISH OIL PO) Take by mouth daily.    . valsartan-hydrochlorothiazide (DIOVAN-HCT) 160-25 MG tablet TAKE 0.5 TABLETS BY MOUTH DAILY. (Patient not taking: Reported on 10/26/2017) 90 tablet 0   No current  facility-administered medications for this visit.     Family History  Problem Relation Age of Onset  . Hypertension Father   . Polycystic kidney disease Father   . Cancer Father        lung  . Hypertension Mother   . Infertility Sister   . Hypertension Sister   . Polycystic kidney disease Sister   . Healthy Brother   . Heart attack Maternal Uncle   . Diabetes Maternal Grandfather   . Colon cancer Neg Hx   . Esophageal cancer Neg Hx   . Rectal cancer Neg Hx   . Stomach cancer Neg Hx     Review of Systems  Genitourinary:       Loss of urine with cough or sneeze   All other systems reviewed and are negative.   Exam:   BP 138/80 (BP Location: Right Arm, Patient Position: Sitting, Cuff Size: Normal)   Pulse 92   Resp 16   Ht 5' 3.75" (1.619 m)   Wt 192 lb (87.1 kg)   LMP 06/19/2012   BMI 33.22 kg/m    Height: 5' 3.75" (161.9 cm)  Ht Readings from Last 3 Encounters:  10/26/17 5' 3.75" (1.619 m)  03/05/17 5' 4.5" (1.638 m)  01/02/17 5'  4.5" (1.638 m)    General appearance: alert, cooperative and appears stated age Head: Normocephalic, without obvious abnormality, atraumatic Neck: no adenopathy, supple, symmetrical, trachea midline and thyroid normal to inspection and palpation Lungs: clear to auscultation bilaterally Breasts: normal appearance, no masses or tenderness Heart: regular rate and rhythm Abdomen: soft, non-tender; bowel sounds normal; no masses,  no organomegaly Extremities: extremities normal, atraumatic, no cyanosis or edema Skin: Skin color, texture, turgor normal. No rashes or lesions Lymph nodes: Cervical, supraclavicular, and axillary nodes normal. No abnormal inguinal nodes palpated Neurologic: Grossly normal   Pelvic: External genitalia:  no lesions              Urethra:  normal appearing urethra with no masses, tenderness or lesions              Bartholins and Skenes: normal                 Vagina: normal appearing vagina with normal color and  discharge, no lesions              Cervix: no lesions              Pap taken: No. Bimanual Exam:  Uterus:  normal size, contour, position, consistency, mobility, non-tender              Adnexa: normal adnexa and no mass, fullness, tenderness               Rectovaginal: Confirms               Anus:  normal sphincter tone, no lesions  Chaperone was present for exam.  A:  Well Woman with normal exam PMP, no HRT Family hx of polycystic kidney disease.  Negative ultrasound 2012. Hypertension (?).  Off BP medication for a few days and normotensive today Almost 20# intentional weight loss  P:   Mammogram guidelines reviewed.   pap smear with neg HR HPV obtained 2/18.  Not indicated today. Blood work has been done for appt next week She is going to watch/measure BP and record these for appt next week.  Offered to RF medication as she is out but declines as she wants to see if she can stay off it long term Colorectal cancer screening UTD Discussed with pt Shingrix vaccination today return annually or prn

## 2017-10-30 ENCOUNTER — Encounter: Payer: Self-pay | Admitting: Adult Health

## 2017-10-30 ENCOUNTER — Ambulatory Visit (INDEPENDENT_AMBULATORY_CARE_PROVIDER_SITE_OTHER): Payer: No Typology Code available for payment source | Admitting: Adult Health

## 2017-10-30 VITALS — BP 128/84 | HR 76 | Ht 64.5 in | Wt 192.2 lb

## 2017-10-30 DIAGNOSIS — I10 Essential (primary) hypertension: Secondary | ICD-10-CM | POA: Diagnosis not present

## 2017-10-30 DIAGNOSIS — D649 Anemia, unspecified: Secondary | ICD-10-CM | POA: Insufficient documentation

## 2017-10-30 DIAGNOSIS — Z0000 Encounter for general adult medical examination without abnormal findings: Secondary | ICD-10-CM | POA: Diagnosis not present

## 2017-10-30 NOTE — Assessment & Plan Note (Signed)
Please check your blood pressure and heart rate daily. Please bring log with at follow-up appt in 4 weeks. Continue with Weight Watchers and increase regular exercise. We will recheck CBC, re: possible anemia, at follow-up.

## 2017-10-30 NOTE — Patient Instructions (Addendum)
Preventive Care for Adults, Female  A healthy lifestyle and preventive care can promote health and wellness. Preventive health guidelines for women include the following key practices.   A routine yearly physical is a good way to check with your health care provider about your health and preventive screening. It is a chance to share any concerns and updates on your health and to receive a thorough exam.   Visit your dentist for a routine exam and preventive care every 6 months. Brush your teeth twice a day and floss once a day. Good oral hygiene prevents tooth decay and gum disease.   The frequency of eye exams is based on your age, health, family medical history, use of contact lenses, and other factors. Follow your health care provider's recommendations for frequency of eye exams.   Eat a healthy diet. Foods like vegetables, fruits, whole grains, low-fat dairy products, and lean protein foods contain the nutrients you need without too many calories. Decrease your intake of foods high in solid fats, added sugars, and salt. Eat the right amount of calories for you.Get information about a proper diet from your health care provider, if necessary.   Regular physical exercise is one of the most important things you can do for your health. Most adults should get at least 150 minutes of moderate-intensity exercise (any activity that increases your heart rate and causes you to sweat) each week. In addition, most adults need muscle-strengthening exercises on 2 or more days a week.   Maintain a healthy weight. The body mass index (BMI) is a screening tool to identify possible weight problems. It provides an estimate of body fat based on height and weight. Your health care provider can find your BMI, and can help you achieve or maintain a healthy weight.For adults 20 years and older:   - A BMI below 18.5 is considered underweight.   - A BMI of 18.5 to 24.9 is normal.   - A BMI of 25 to 29.9 is  considered overweight.   - A BMI of 30 and above is considered obese.   Maintain normal blood lipids and cholesterol levels by exercising and minimizing your intake of trans and saturated fats.  Eat a balanced diet with plenty of fruit and vegetables. Blood tests for lipids and cholesterol should begin at age 20 and be repeated every 5 years minimum.  If your lipid or cholesterol levels are high, you are over 40, or you are at high risk for heart disease, you may need your cholesterol levels checked more frequently.Ongoing high lipid and cholesterol levels should be treated with medicines if diet and exercise are not working.   If you smoke, find out from your health care provider how to quit. If you do not use tobacco, do not start.   Lung cancer screening is recommended for adults aged 55-80 years who are at high risk for developing lung cancer because of a history of smoking. A yearly low-dose CT scan of the lungs is recommended for people who have at least a 30-pack-year history of smoking and are a current smoker or have quit within the past 15 years. A pack year of smoking is smoking an average of 1 pack of cigarettes a day for 1 year (for example: 1 pack a day for 30 years or 2 packs a day for 15 years). Yearly screening should continue until the smoker has stopped smoking for at least 15 years. Yearly screening should be stopped for people who develop a   health problem that would prevent them from having lung cancer treatment.   If you are pregnant, do not drink alcohol. If you are breastfeeding, be very cautious about drinking alcohol. If you are not pregnant and choose to drink alcohol, do not have more than 1 drink per day. One drink is considered to be 12 ounces (355 mL) of beer, 5 ounces (148 mL) of wine, or 1.5 ounces (44 mL) of liquor.   Avoid use of street drugs. Do not share needles with anyone. Ask for help if you need support or instructions about stopping the use of  drugs.   High blood pressure causes heart disease and increases the risk of stroke. Your blood pressure should be checked at least yearly.  Ongoing high blood pressure should be treated with medicines if weight loss and exercise do not work.   If you are 69-55 years old, ask your health care provider if you should take aspirin to prevent strokes.   Diabetes screening involves taking a blood sample to check your fasting blood sugar level. This should be done once every 3 years, after age 38, if you are within normal weight and without risk factors for diabetes. Testing should be considered at a younger age or be carried out more frequently if you are overweight and have at least 1 risk factor for diabetes.   Breast cancer screening is essential preventive care for women. You should practice "breast self-awareness."  This means understanding the normal appearance and feel of your breasts and may include breast self-examination.  Any changes detected, no matter how small, should be reported to a health care provider.  Women in their 80s and 30s should have a clinical breast exam (CBE) by a health care provider as part of a regular health exam every 1 to 3 years.  After age 66, women should have a CBE every year.  Starting at age 1, women should consider having a mammogram (breast X-ray test) every year.  Women who have a family history of breast cancer should talk to their health care provider about genetic screening.  Women at a high risk of breast cancer should talk to their health care providers about having an MRI and a mammogram every year.   -Breast cancer gene (BRCA)-related cancer risk assessment is recommended for women who have family members with BRCA-related cancers. BRCA-related cancers include breast, ovarian, tubal, and peritoneal cancers. Having family members with these cancers may be associated with an increased risk for harmful changes (mutations) in the breast cancer genes BRCA1 and  BRCA2. Results of the assessment will determine the need for genetic counseling and BRCA1 and BRCA2 testing.   The Pap test is a screening test for cervical cancer. A Pap test can show cell changes on the cervix that might become cervical cancer if left untreated. A Pap test is a procedure in which cells are obtained and examined from the lower end of the uterus (cervix).   - Women should have a Pap test starting at age 57.   - Between ages 90 and 70, Pap tests should be repeated every 2 years.   - Beginning at age 63, you should have a Pap test every 3 years as long as the past 3 Pap tests have been normal.   - Some women have medical problems that increase the chance of getting cervical cancer. Talk to your health care provider about these problems. It is especially important to talk to your health care provider if a  new problem develops soon after your last Pap test. In these cases, your health care provider may recommend more frequent screening and Pap tests.   - The above recommendations are the same for women who have or have not gotten the vaccine for human papillomavirus (HPV).   - If you had a hysterectomy for a problem that was not cancer or a condition that could lead to cancer, then you no longer need Pap tests. Even if you no longer need a Pap test, a regular exam is a good idea to make sure no other problems are starting.   - If you are between ages 2 and 13 years, and you have had normal Pap tests going back 10 years, you no longer need Pap tests. Even if you no longer need a Pap test, a regular exam is a good idea to make sure no other problems are starting.   - If you have had past treatment for cervical cancer or a condition that could lead to cancer, you need Pap tests and screening for cancer for at least 20 years after your treatment.   - If Pap tests have been discontinued, risk factors (such as a new sexual partner) need to be reassessed to determine if screening should  be resumed.   - The HPV test is an additional test that may be used for cervical cancer screening. The HPV test looks for the virus that can cause the cell changes on the cervix. The cells collected during the Pap test can be tested for HPV. The HPV test could be used to screen women aged 63 years and older, and should be used in women of any age who have unclear Pap test results. After the age of 69, women should have HPV testing at the same frequency as a Pap test.   Colorectal cancer can be detected and often prevented. Most routine colorectal cancer screening begins at the age of 64 years and continues through age 49 years. However, your health care provider may recommend screening at an earlier age if you have risk factors for colon cancer. On a yearly basis, your health care provider may provide home test kits to check for hidden blood in the stool.  Use of a small camera at the end of a tube, to directly examine the colon (sigmoidoscopy or colonoscopy), can detect the earliest forms of colorectal cancer. Talk to your health care provider about this at age 56, when routine screening begins. Direct exam of the colon should be repeated every 5 -10 years through age 57 years, unless early forms of pre-cancerous polyps or small growths are found.   People who are at an increased risk for hepatitis B should be screened for this virus. You are considered at high risk for hepatitis B if:  -You were born in a country where hepatitis B occurs often. Talk with your health care provider about which countries are considered high risk.  - Your parents were born in a high-risk country and you have not received a shot to protect against hepatitis B (hepatitis B vaccine).  - You have HIV or AIDS.  - You use needles to inject street drugs.  - You live with, or have sex with, someone who has Hepatitis B.  - You get hemodialysis treatment.  - You take certain medicines for conditions like cancer, organ  transplantation, and autoimmune conditions.   Hepatitis C blood testing is recommended for all people born from [birth year] through 1965 and any individual  with known risks for hepatitis C.   Practice safe sex. Use condoms and avoid high-risk sexual practices to reduce the spread of sexually transmitted infections (STIs). STIs include gonorrhea, chlamydia, syphilis, trichomonas, herpes, HPV, and human immunodeficiency virus (HIV). Herpes, HIV, and HPV are viral illnesses that have no cure. They can result in disability, cancer, and death. Sexually active women aged 25 years and younger should be checked for chlamydia. Older women with new or multiple partners should also be tested for chlamydia. Testing for other STIs is recommended if you are sexually active and at increased risk.   Osteoporosis is a disease in which the bones lose minerals and strength with aging. This can result in serious bone fractures or breaks. The risk of osteoporosis can be identified using a bone density scan. Women ages 65 years and over and women at risk for fractures or osteoporosis should discuss screening with their health care providers. Ask your health care provider whether you should take a calcium supplement or vitamin D to There are also several preventive steps women can take to avoid osteoporosis and resulting fractures or to keep osteoporosis from worsening. -->Recommendations include:  Eat a balanced diet high in fruits, vegetables, calcium, and vitamins.  Get enough calcium. The recommended total intake of is 1,200 mg daily; for best absorption, if taking supplements, divide doses into 250-500 mg doses throughout the day. Of the two types of calcium, calcium carbonate is best absorbed when taken with food but calcium citrate can be taken on an empty stomach.  Get enough vitamin D. NAMS and the National Osteoporosis Foundation recommend at least 1,000 IU per day for women age 50 and over who are at risk of vitamin D  deficiency. Vitamin D deficiency can be caused by inadequate sun exposure (for example, those who live in northern latitudes).  Avoid alcohol and smoking. Heavy alcohol intake (more than 7 drinks per week) increases the risk of falls and hip fracture and women smokers tend to lose bone more rapidly and have lower bone mass than nonsmokers. Stopping smoking is one of the most important changes women can make to improve their health and decrease risk for disease.  Be physically active every day. Weight-bearing exercise (for example, fast walking, hiking, jogging, and weight training) may strengthen bones or slow the rate of bone loss that comes with aging. Balancing and muscle-strengthening exercises can reduce the risk of falling and fracture.  Consider therapeutic medications. Currently, several types of effective drugs are available. Healthcare providers can recommend the type most appropriate for each woman.  Eliminate environmental factors that may contribute to accidents. Falls cause nearly 90% of all osteoporotic fractures, so reducing this risk is an important bone-health strategy. Measures include ample lighting, removing obstructions to walking, using nonskid rugs on floors, and placing mats and/or grab bars in showers.  Be aware of medication side effects. Some common medicines make bones weaker. These include a type of steroid drug called glucocorticoids used for arthritis and asthma, some antiseizure drugs, certain sleeping pills, treatments for endometriosis, and some cancer drugs. An overactive thyroid gland or using too much thyroid hormone for an underactive thyroid can also be a problem. If you are taking these medicines, talk to your doctor about what you can do to help protect your bones.reduce the rate of osteoporosis.    Menopause can be associated with physical symptoms and risks. Hormone replacement therapy is available to decrease symptoms and risks. You should talk to your  health care provider   about whether hormone replacement therapy is right for you.   Use sunscreen. Apply sunscreen liberally and repeatedly throughout the day. You should seek shade when your shadow is shorter than you. Protect yourself by wearing long sleeves, pants, a wide-brimmed hat, and sunglasses year round, whenever you are outdoors.   Once a month, do a whole body skin exam, using a mirror to look at the skin on your back. Tell your health care provider of new moles, moles that have irregular borders, moles that are larger than a pencil eraser, or moles that have changed in shape or color.   -Stay current with required vaccines (immunizations).   Influenza vaccine. All adults should be immunized every year.  Tetanus, diphtheria, and acellular pertussis (Td, Tdap) vaccine. Pregnant women should receive 1 dose of Tdap vaccine during each pregnancy. The dose should be obtained regardless of the length of time since the last dose. Immunization is preferred during the 27th 36th week of gestation. An adult who has not previously received Tdap or who does not know her vaccine status should receive 1 dose of Tdap. This initial dose should be followed by tetanus and diphtheria toxoids (Td) booster doses every 10 years. Adults with an unknown or incomplete history of completing a 3-dose immunization series with Td-containing vaccines should begin or complete a primary immunization series including a Tdap dose. Adults should receive a Td booster every 10 years.  Varicella vaccine. An adult without evidence of immunity to varicella should receive 2 doses or a second dose if she has previously received 1 dose. Pregnant females who do not have evidence of immunity should receive the first dose after pregnancy. This first dose should be obtained before leaving the health care facility. The second dose should be obtained 4 8 weeks after the first dose.  Human papillomavirus (HPV) vaccine. Females aged 58 26  years who have not received the vaccine previously should obtain the 3-dose series. The vaccine is not recommended for use in pregnant females. However, pregnancy testing is not needed before receiving a dose. If a female is found to be pregnant after receiving a dose, no treatment is needed. In that case, the remaining doses should be delayed until after the pregnancy. Immunization is recommended for any person with an immunocompromised condition through the age of 76 years if she did not get any or all doses earlier. During the 3-dose series, the second dose should be obtained 4 8 weeks after the first dose. The third dose should be obtained 24 weeks after the first dose and 16 weeks after the second dose.  Zoster vaccine. One dose is recommended for adults aged 16 years or older unless certain conditions are present.  Measles, mumps, and rubella (MMR) vaccine. Adults born before [birth year] generally are considered immune to measles and mumps. Adults born in [birth year] or later should have 1 or more doses of MMR vaccine unless there is a contraindication to the vaccine or there is laboratory evidence of immunity to each of the three diseases. A routine second dose of MMR vaccine should be obtained at least 28 days after the first dose for students attending postsecondary schools, health care workers, or international travelers. People who received inactivated measles vaccine or an unknown type of measles vaccine during 1963 1967 should receive 2 doses of MMR vaccine. People who received inactivated mumps vaccine or an unknown type of mumps vaccine before 1979 and are at high risk for mumps infection should consider immunization with 2 doses of  MMR vaccine. For females of childbearing age, rubella immunity should be determined. If there is no evidence of immunity, females who are not pregnant should be vaccinated. If there is no evidence of immunity, females who are pregnant should delay immunization until after pregnancy.  Unvaccinated health care workers born before [birth year] who lack laboratory evidence of measles, mumps, or rubella immunity or laboratory confirmation of disease should consider measles and mumps immunization with 2 doses of MMR vaccine or rubella immunization with 1 dose of MMR vaccine.  Pneumococcal 13-valent conjugate (PCV13) vaccine. When indicated, a person who is uncertain of her immunization history and has no record of immunization should receive the PCV13 vaccine. An adult aged 54 years or older who has certain medical conditions and has not been previously immunized should receive 1 dose of PCV13 vaccine. This PCV13 should be followed with a dose of pneumococcal polysaccharide (PPSV23) vaccine. The PPSV23 vaccine dose should be obtained at least 8 weeks after the dose of PCV13 vaccine. An adult aged 58 years or older who has certain medical conditions and previously received 1 or more doses of PPSV23 vaccine should receive 1 dose of PCV13. The PCV13 vaccine dose should be obtained 1 or more years after the last PPSV23 vaccine dose.  Pneumococcal polysaccharide (PPSV23) vaccine. When PCV13 is also indicated, PCV13 should be obtained first. All adults aged 58 years and older should be immunized. An adult younger than age 65 years who has certain medical conditions should be immunized. Any person who resides in a nursing home or long-term care facility should be immunized. An adult smoker should be immunized. People with an immunocompromised condition and certain other conditions should receive both PCV13 and PPSV23 vaccines. People with human immunodeficiency virus (HIV) infection should be immunized as soon as possible after diagnosis. Immunization during chemotherapy or radiation therapy should be avoided. Routine use of PPSV23 vaccine is not recommended for American Indians, [place] Natives, or people younger than 65 years unless there are medical conditions that require PPSV23 vaccine. When indicated,  people who have unknown immunization and have no record of immunization should receive PPSV23 vaccine. One-time revaccination 5 years after the first dose of PPSV23 is recommended for people aged 70 64 years who have chronic kidney failure, nephrotic syndrome, asplenia, or immunocompromised conditions. People who received 1 2 doses of PPSV23 before age 32 years should receive another dose of PPSV23 vaccine at age 96 years or later if at least 5 years have passed since the previous dose. Doses of PPSV23 are not needed for people immunized with PPSV23 at or after age 55 years.  Meningococcal vaccine. Adults with asplenia or persistent complement component deficiencies should receive 2 doses of quadrivalent meningococcal conjugate (MenACWY-D) vaccine. The doses should be obtained at least 2 months apart. Microbiologists working with certain meningococcal bacteria, [employer] recruits, people at risk during an outbreak, and people who travel to or live in countries with a high rate of meningitis should be immunized. A first-year college student up through age 58 years who is living in a residence hall should receive a dose if she did not receive a dose on or after her 16th birthday. Adults who have certain high-risk conditions should receive one or more doses of vaccine.  Hepatitis A vaccine. Adults who wish to be protected from this disease, have certain high-risk conditions, work with hepatitis A-infected animals, work in hepatitis A research labs, or travel to or work in countries with a high rate of hepatitis A should be  immunized. Adults who were previously unvaccinated and who anticipate close contact with an international adoptee during the first 60 days after arrival in the [place] from a country with a high rate of hepatitis A should be immunized.  Hepatitis B vaccine.  Adults who wish to be protected from this disease, have certain high-risk conditions, may be exposed to blood or other infectious  body fluids, are household contacts or sex partners of hepatitis B positive people, are clients or workers in certain care facilities, or travel to or work in countries with a high rate of hepatitis B should be immunized.  Haemophilus influenzae type b (Hib) vaccine. A previously unvaccinated person with asplenia or sickle cell disease or having a scheduled splenectomy should receive 1 dose of Hib vaccine. Regardless of previous immunization, a recipient of a hematopoietic stem cell transplant should receive a 3-dose series 6 12 months after her successful transplant. Hib vaccine is not recommended for adults with HIV infection.  Preventive Services / Frequency Ages 68 to 39years  Blood pressure check.** / Every 1 to 2 years.  Lipid and cholesterol check.** / Every 5 years beginning at age 20.  Clinical breast exam.** / Every 3 years for women in their 32s and 1s.  BRCA-related cancer risk assessment.** / For women who have family members with a BRCA-related cancer (breast, ovarian, tubal, or peritoneal cancers).  Pap test.** / Every 2 years from ages 19 through 5. Every 3 years starting at age 38 through age 42 or 40 with a history of 3 consecutive normal Pap tests.  HPV screening.** / Every 3 years from ages 46 through ages 73 to 56 with a history of 3 consecutive normal Pap tests.  Hepatitis C blood test.** / For any individual with known risks for hepatitis C.  Skin self-exam. / Monthly.  Influenza vaccine. / Every year.  Tetanus, diphtheria, and acellular pertussis (Tdap, Td) vaccine.** / Consult your health care provider. Pregnant women should receive 1 dose of Tdap vaccine during each pregnancy. 1 dose of Td every 10 years.  Varicella vaccine.** / Consult your health care provider. Pregnant females who do not have evidence of immunity should receive the first dose after pregnancy.  HPV vaccine. / 3 doses over 6 months, if 73 and younger. The vaccine is not recommended for use in  pregnant females. However, pregnancy testing is not needed before receiving a dose.  Measles, mumps, rubella (MMR) vaccine.** / You need at least 1 dose of MMR if you were born in 1957 or later. You may also need a 2nd dose. For females of childbearing age, rubella immunity should be determined. If there is no evidence of immunity, females who are not pregnant should be vaccinated. If there is no evidence of immunity, females who are pregnant should delay immunization until after pregnancy.  Pneumococcal 13-valent conjugate (PCV13) vaccine.** / Consult your health care provider.  Pneumococcal polysaccharide (PPSV23) vaccine.** / 1 to 2 doses if you smoke cigarettes or if you have certain conditions.  Meningococcal vaccine.** / 1 dose if you are age 55 to 36 years and a Market researcher living in a residence hall, or have one of several medical conditions, you need to get vaccinated against meningococcal disease. You may also need additional booster doses.  Hepatitis A vaccine.** / Consult your health care provider.  Hepatitis B vaccine.** / Consult your health care provider.  Haemophilus influenzae type b (Hib) vaccine.** / Consult your health care provider.  Ages 10 to 62years  Blood pressure check.** / Every 1 to 2 years.  Lipid and cholesterol check.** / Every 5 years beginning at age 20 years.  Lung cancer screening. / Every year if you are aged 55 80 years and have a 30-pack-year history of smoking and currently smoke or have quit within the past 15 years. Yearly screening is stopped once you have quit smoking for at least 15 years or develop a health problem that would prevent you from having lung cancer treatment.  Clinical breast exam.** / Every year after age 40 years.  BRCA-related cancer risk assessment.** / For women who have family members with a BRCA-related cancer (breast, ovarian, tubal, or peritoneal cancers).  Mammogram.** / Every year beginning at age 40  years and continuing for as long as you are in good health. Consult with your health care provider.  Pap test.** / Every 3 years starting at age 30 years through age 65 or 70 years with a history of 3 consecutive normal Pap tests.  HPV screening.** / Every 3 years from ages 30 years through ages 65 to 70 years with a history of 3 consecutive normal Pap tests.  Fecal occult blood test (FOBT) of stool. / Every year beginning at age 50 years and continuing until age 75 years. You may not need to do this test if you get a colonoscopy every 10 years.  Flexible sigmoidoscopy or colonoscopy.** / Every 5 years for a flexible sigmoidoscopy or every 10 years for a colonoscopy beginning at age 50 years and continuing until age 75 years.  Hepatitis C blood test.** / For all people born from 1945 through 1965 and any individual with known risks for hepatitis C.  Skin self-exam. / Monthly.  Influenza vaccine. / Every year.  Tetanus, diphtheria, and acellular pertussis (Tdap/Td) vaccine.** / Consult your health care provider. Pregnant women should receive 1 dose of Tdap vaccine during each pregnancy. 1 dose of Td every 10 years.  Varicella vaccine.** / Consult your health care provider. Pregnant females who do not have evidence of immunity should receive the first dose after pregnancy.  Zoster vaccine.** / 1 dose for adults aged 60 years or older.  Measles, mumps, rubella (MMR) vaccine.** / You need at least 1 dose of MMR if you were born in 1957 or later. You may also need a 2nd dose. For females of childbearing age, rubella immunity should be determined. If there is no evidence of immunity, females who are not pregnant should be vaccinated. If there is no evidence of immunity, females who are pregnant should delay immunization until after pregnancy.  Pneumococcal 13-valent conjugate (PCV13) vaccine.** / Consult your health care provider.  Pneumococcal polysaccharide (PPSV23) vaccine.** / 1 to 2 doses if  you smoke cigarettes or if you have certain conditions.  Meningococcal vaccine.** / Consult your health care provider.  Hepatitis A vaccine.** / Consult your health care provider.  Hepatitis B vaccine.** / Consult your health care provider.  Haemophilus influenzae type b (Hib) vaccine.** / Consult your health care provider.  Ages 65 years and over  Blood pressure check.** / Every 1 to 2 years.  Lipid and cholesterol check.** / Every 5 years beginning at age 20 years.  Lung cancer screening. / Every year if you are aged 55 80 years and have a 30-pack-year history of smoking and currently smoke or have quit within the past 15 years. Yearly screening is stopped once you have quit smoking for at least 15 years or develop a health problem that   would prevent you from having lung cancer treatment.  Clinical breast exam.** / Every year after age 103 years.  BRCA-related cancer risk assessment.** / For women who have family members with a BRCA-related cancer (breast, ovarian, tubal, or peritoneal cancers).  Mammogram.** / Every year beginning at age 36 years and continuing for as long as you are in good health. Consult with your health care provider.  Pap test.** / Every 3 years starting at age 5 years through age 85 or 10 years with 3 consecutive normal Pap tests. Testing can be stopped between 65 and 70 years with 3 consecutive normal Pap tests and no abnormal Pap or HPV tests in the past 10 years.  HPV screening.** / Every 3 years from ages 93 years through ages 70 or 45 years with a history of 3 consecutive normal Pap tests. Testing can be stopped between 65 and 70 years with 3 consecutive normal Pap tests and no abnormal Pap or HPV tests in the past 10 years.  Fecal occult blood test (FOBT) of stool. / Every year beginning at age 8 years and continuing until age 45 years. You may not need to do this test if you get a colonoscopy every 10 years.  Flexible sigmoidoscopy or colonoscopy.** /  Every 5 years for a flexible sigmoidoscopy or every 10 years for a colonoscopy beginning at age 69 years and continuing until age 68 years.  Hepatitis C blood test.** / For all people born from [birth year] through 1967 and any individual with known risks for hepatitis C.  Osteoporosis screening.** / A one-time screening for women ages 7 years and over and women at risk for fractures or osteoporosis.  Skin self-exam. / Monthly.  Influenza vaccine. / Every year.  Tetanus, diphtheria, and acellular pertussis (Tdap/Td) vaccine.** / 1 dose of Td every 10 years.  Varicella vaccine.** / Consult your health care provider.  Zoster vaccine.** / 1 dose for adults aged 5 years or older.  Pneumococcal 13-valent conjugate (PCV13) vaccine.** / Consult your health care provider.  Pneumococcal polysaccharide (PPSV23) vaccine.** / 1 dose for all adults aged 74 years and older.  Meningococcal vaccine.** / Consult your health care provider.  Hepatitis A vaccine.** / Consult your health care provider.  Hepatitis B vaccine.** / Consult your health care provider.  Haemophilus influenzae type b (Hib) vaccine.** / Consult your health care provider. ** Family history and personal history of risk and conditions may change your health care provider's recommendations. Document Released: 08/01/2001 Document Revised: 03/26/2013  [facility] Patient Information 2014 [place], [place].   EXERCISE AND DIET:  We recommended that you start or continue a regular exercise program for good health. Regular exercise means any activity that makes your heart beat faster and makes you sweat.  We recommend exercising at least 30 minutes per day at least 3 days a week, preferably 5.  We also recommend a diet low in fat and sugar / carbohydrates.  Inactivity, poor dietary choices and obesity can cause diabetes, heart attack, stroke, and kidney damage, among others.     ALCOHOL AND SMOKING:  Women should limit their alcohol intake to no  more than 7 drinks/beers/glasses of wine (combined, not each!) per week. Moderation of alcohol intake to this level decreases your risk of breast cancer and liver damage.  ( And of course, no recreational drugs are part of a healthy lifestyle.)  Also, you should not be smoking at all or even being exposed to second hand smoke. Most people know smoking can  cause cancer, and various heart and lung diseases, but did you know it also contributes to weakening of your bones?  Aging of your skin?  Yellowing of your teeth and nails?   CALCIUM AND VITAMIN D:  Adequate intake of calcium and Vitamin D are recommended.  The recommendations for exact amounts of these supplements seem to change often, but generally speaking 600 mg of calcium (either carbonate or citrate) and 800 units of Vitamin D per day seems prudent. Certain women may benefit from higher intake of Vitamin D.  If you are among these women, your doctor will have told you during your visit.     PAP SMEARS:  Pap smears, to check for cervical cancer or precancers,  have traditionally been done yearly, although recent scientific advances have shown that most women can have pap smears less often.  However, every woman still should have a physical exam from her gynecologist or primary care physician every year. It will include a breast check, inspection of the vulva and vagina to check for abnormal growths or skin changes, a visual exam of the cervix, and then an exam to evaluate the size and shape of the uterus and ovaries.  And after 59 years of age, a rectal exam is indicated to check for rectal cancers. We will also provide age appropriate advice regarding health maintenance, like when you should have certain vaccines, screening for sexually transmitted diseases, bone density testing, colonoscopy, mammograms, etc.    MAMMOGRAMS:  All women over 65 years old should have a yearly mammogram. Many facilities now offer a "3D" mammogram, which may cost  around $50 extra out of pocket. If possible,  we recommend you accept the option to have the 3D mammogram performed.  It both reduces the number of women who will be called back for extra views which then turn out to be normal, and it is better than the routine mammogram at detecting truly abnormal areas.     COLONOSCOPY:  Colonoscopy to screen for colon cancer is recommended for all women at age 26.  We know, you hate the idea of the prep.  We agree, BUT, having colon cancer and not knowing it is worse!!  Colon cancer so often starts as a polyp that can be seen and removed at colonscopy, which can quite literally save your life!  And if your first colonoscopy is normal and you have no family history of colon cancer, most women don't have to have it again for 10 years.  Once every ten years, you can do something that may end up saving your life, right?  We will be happy to help you get it scheduled when you are ready.  Be sure to check your insurance coverage so you understand how much it will cost.  It may be covered as a preventative service at no cost, but you should check your particular policy.    Mediterranean Diet A Mediterranean diet refers to food and lifestyle choices that are based on the traditions of countries located on the [employer]. This way of eating has been shown to help prevent certain conditions and improve outcomes for people who have chronic diseases, like kidney disease and heart disease. What are tips for following this plan? Lifestyle  Cook and eat meals together with your family, when possible.  Drink enough fluid to keep your urine clear or pale yellow.  Be physically active every day. This includes: ? Aerobic exercise like running or swimming. ? Leisure activities like  gardening, walking, or housework.  Get 7-8 hours of sleep each night.  If recommended by your health care provider, drink red wine in moderation. This means 1 glass a day for nonpregnant women  and 2 glasses a day for men. A glass of wine equals 5 oz (150 mL). Reading food labels  Check the serving size of packaged foods. For foods such as rice and pasta, the serving size refers to the amount of cooked product, not dry.  Check the total fat in packaged foods. Avoid foods that have saturated fat or trans fats.  Check the ingredients list for added sugars, such as corn syrup. Shopping  At the grocery store, buy most of your food from the areas near the walls of the store. This includes: ? Fresh fruits and vegetables (produce). ? Grains, beans, nuts, and seeds. Some of these may be available in unpackaged forms or large amounts (in bulk). ? Fresh seafood. ? Poultry and eggs. ? Low-fat dairy products.  Buy whole ingredients instead of prepackaged foods.  Buy fresh fruits and vegetables in-season from local farmers markets.  Buy frozen fruits and vegetables in resealable bags.  If you do not have access to quality fresh seafood, buy precooked frozen shrimp or canned fish, such as tuna, salmon, or sardines.  Buy small amounts of raw or cooked vegetables, salads, or olives from the deli or salad bar at your store.  Stock your pantry so you always have certain foods on hand, such as olive oil, canned tuna, canned tomatoes, rice, pasta, and beans. Cooking  Cook foods with extra-virgin olive oil instead of using butter or other vegetable oils.  Have meat as a side dish, and have vegetables or grains as your main dish. This means having meat in small portions or adding small amounts of meat to foods like pasta or stew.  Use beans or vegetables instead of meat in common dishes like chili or lasagna.  Experiment with different cooking methods. Try roasting or broiling vegetables instead of steaming or sauteing them.  Add frozen vegetables to soups, stews, pasta, or rice.  Add nuts or seeds for added healthy fat at each meal. You can add these to yogurt, salads, or vegetable  dishes.  Marinate fish or vegetables using olive oil, lemon juice, garlic, and fresh herbs. Meal planning  Plan to eat 1 vegetarian meal one day each week. Try to work up to 2 vegetarian meals, if possible.  Eat seafood 2 or more times a week.  Have healthy snacks readily available, such as: ? Vegetable sticks with hummus. ? [place] yogurt. ? Fruit and nut trail mix.  Eat balanced meals throughout the week. This includes: ? Fruit: 2-3 servings a day ? Vegetables: 4-5 servings a day ? Low-fat dairy: 2 servings a day ? Fish, poultry, or lean meat: 1 serving a day ? Beans and legumes: 2 or more servings a week ? Nuts and seeds: 1-2 servings a day ? Whole grains: 6-8 servings a day ? Extra-virgin olive oil: 3-4 servings a day  Limit red meat and sweets to only a few servings a month What are my food choices?  Mediterranean diet ? Recommended ? Grains: Whole-grain pasta. Brown rice. Bulgar wheat. Polenta. Couscous. Whole-wheat bread. [place]. ? Vegetables: Artichokes. Beets. Broccoli. Cabbage. Carrots. Eggplant. Green beans. Chard. Kale. Spinach. Onions. Leeks. Peas. Squash. Tomatoes. Peppers. Radishes. ? Fruits: Apples. Apricots. Avocado. Berries. Bananas. Cherries. Dates. Figs. Grapes. Lemons. Melon. Oranges. Peaches. Plums. Pomegranate. ? Meats and other protein  foods: Beans. Almonds. Sunflower seeds. Pine nuts. Peanuts. [place]. Salmon. Scallops. Shrimp. [place]. Tilapia. Clams. Oysters. Eggs. ? Dairy: Low-fat milk. Cheese. Greek yogurt. ? Beverages: Water. Red wine. Herbal tea. ? Fats and oils: Extra virgin olive oil. Avocado oil. Grape seed oil. ? Sweets and desserts: [place] yogurt with honey. Baked apples. Poached pears. Trail mix. ? Seasoning and other foods: Basil. Cilantro. Coriander. Cumin. Mint. Parsley. Sage. Rosemary. Tarragon. Garlic. Oregano. Thyme. Pepper. Balsalmic vinegar. Tahini. Hummus. Tomato sauce. Olives. Mushrooms. ? Limit these ? Grains: Prepackaged pasta or  rice dishes. Prepackaged cereal with added sugar. ? Vegetables: Deep fried potatoes (french fries). ? Fruits: Fruit canned in syrup. ? Meats and other protein foods: Beef. Pork. Lamb. Poultry with skin. Hot dogs. [place] Salines. ? Dairy: Ice cream. Sour cream. Whole milk. ? Beverages: Juice. Sugar-sweetened soft drinks. Beer. Liquor and spirits. ? Fats and oils: Butter. Canola oil. Vegetable oil. Beef fat (tallow). Lard. ? Sweets and desserts: Cookies. Cakes. Pies. Candy. ? Seasoning and other foods: Mayonnaise. Premade sauces and marinades. ? The items listed may not be a complete list. Talk with your dietitian about what dietary choices are right for you. Summary  The Mediterranean diet includes both food and lifestyle choices.  Eat a variety of fresh fruits and vegetables, beans, nuts, seeds, and whole grains.  Limit the amount of red meat and sweets that you eat.  Talk with your health care provider about whether it is safe for you to drink red wine in moderation. This means 1 glass a day for nonpregnant women and 2 glasses a day for men. A glass of wine equals 5 oz (150 mL). This information is not intended to replace advice given to you by your health care provider. Make sure you discuss any questions you have with your health care provider. Document Released: 01/27/2016 Document Revised: 02/29/2016 Document Reviewed: 01/27/2016 Elsevier Interactive Patient Education  2018 [employer].   Exercising to [employer] Exercising can help you to lose weight. In order to lose weight through exercise, you need to do vigorous-intensity exercise. You can tell that you are exercising with vigorous intensity if you are breathing very hard and fast and cannot hold a conversation while exercising. Moderate-intensity exercise helps to maintain your current weight. You can tell that you are exercising at a moderate level if you have a higher heart rate and faster breathing, but you are still able to hold a  conversation. How often should I exercise? Choose an activity that you enjoy and set realistic goals. Your health care provider can help you to make an activity plan that works for you. Exercise regularly as directed by your health care provider. This may include:  Doing resistance training twice each week, such as: ? Push-ups. ? Sit-ups. ? Lifting weights. ? Using resistance bands.  Doing a given intensity of exercise for a given amount of time. Choose from these options: ? 150 minutes of moderate-intensity exercise every week. ? 75 minutes of vigorous-intensity exercise every week. ? A mix of moderate-intensity and vigorous-intensity exercise every week.  Children, pregnant women, people who are out of shape, people who are overweight, and older adults may need to consult a health care provider for individual recommendations. If you have any sort of medical condition, be sure to consult your health care provider before starting a new exercise program. What are some activities that can help me to lose weight?  Walking at a rate of at least 4.5 miles an hour.  Jogging or  running at a rate of 5 miles per hour.  Biking at a rate of at least 10 miles per hour.  Lap swimming.  Roller-skating or in-line skating.  Cross-country skiing.  Vigorous competitive sports, such as football, basketball, and soccer.  Jumping rope.  Aerobic dancing. How can I be more active in my day-to-day activities?  Use the stairs instead of the elevator.  Take a walk during your lunch break.  If you drive, park your car farther away from work or school.  If you take public transportation, get off one stop early and walk the rest of the way.  Make all of your phone calls while standing up and walking around.  Get up, stretch, and walk around every 30 minutes throughout the day. What guidelines should I follow while exercising?  Do not exercise so much that you hurt yourself, feel dizzy, or get  very short of breath.  Consult your health care provider prior to starting a new exercise program.  Wear comfortable clothes and shoes with good support.  Drink plenty of water while you exercise to prevent dehydration or heat stroke. Body water is lost during exercise and must be replaced.  Work out until you breathe faster and your heart beats faster. This information is not intended to replace advice given to you by your health care provider. Make sure you discuss any questions you have with your health care provider. Document Released: 07/08/2010 Document Revised: 11/11/2015 Document Reviewed: 11/06/2013 Elsevier Interactive Patient Education  2018 [employer].  Please check your blood pressure and heart rate daily. Please bring log with at follow-up appt in 4 weeks. Continue with Weight Watchers and increase regular exercise. We will recheck CBC, re: possible anemia, at follow-up. NICE TO SEE YOU!

## 2017-10-30 NOTE — Progress Notes (Signed)
Subjective:    Patient ID: Marissa Duarte, female    DOB: 05/15/1961, 57 y.o.   MRN: [id number]  HPI:  03/05/17 OV: Marissa Duarte presents for f/u: medical wt loss. This will be the 6 month on Phentermine 37.5mg , she has been taking the medication each morning and denies SE.  She preforms cardio training 3 days a week- on either bike of treadmill.  Daily diet is typically- fruit with whole grains for breakfast, fruit snake mid-morning, lunch/dinner lean protein with vegetables.  She estimates to drink >80 ounces water/day and does not use tobacco. She consumes a glass of wine several evenings a week. She feels "wonderful and my clothes fit better than they have in years".  10/30/17 OV: Marissa Duarte is here for CPE  The 10-year ASCVD risk score Denman George DC Montez Hageman., et al., 2013) is: 4.2%   Values used to calculate the score:     Age: 59 years     Sex: Female     Is Non-Hispanic African American: No     Diabetic: No     Tobacco smoker: No     Systolic Blood Pressure: 128 mmHg     Is BP treated: Yes     HDL Cholesterol: 44 mg/dL     Total Cholesterol: 175 mg/dL  LDL- 409 Reviewed all recent labs; slight drop in H/H.  She reports hx of anemia during pregnancy, she denies hematuria/hematochezia She started Weight Watchers 1 April and has been exercising 3 times/week- stationary bike and walking Her ultimate wt loss goal- 20lbs, current wt 192 She has been off her antihypertensive >1.5 weeks and her BP here and at her OB/GYN last week were both at goal. She would like to try TLC to manage BP.  She has been on Valsartan/HCTZ 160/25mg  >3 years She estimates to drink >100 oz water/day and continues to abstain from tobacco use.  Healthcare Maintenance: PAP-UTD Mammogram-UTD Colonoscopy-UTD Immunizations-UTD  Patient Care Team    Relationship Specialty Notifications Start End  Julaine Fusi, NP PCP - General Family Medicine  08/23/16     Patient Active Problem List   Diagnosis Date Noted   . Healthcare maintenance 10/30/2017  . Anemia 10/30/2017  . Fatigue 08/23/2016  . Obesity (BMI 35.0-39.9 without comorbidity) 08/23/2016  . Essential hypertension, benign 03/05/2014     Past Medical History:  Diagnosis Date  . Acid reflux   . Esophagitis 3/11    noted on EGD  . Hypertension   . Kidney stone      Past Surgical History:  Procedure Laterality Date  . BREAST LUMPECTOMY  age 11   fibroadenoma  . COLONOSCOPY    . TUBAL LIGATION  1990  . UPPER GASTROINTESTINAL ENDOSCOPY    . WISDOM TOOTH EXTRACTION       Family History  Problem Relation Age of Onset  . Hypertension Father   . Polycystic kidney disease Father   . Cancer Father        lung  . Hypertension Mother   . Infertility Sister   . Hypertension Sister   . Polycystic kidney disease Sister   . Healthy Brother   . Heart attack Maternal Uncle   . Diabetes Maternal Grandfather   . Colon cancer Neg Hx   . Esophageal cancer Neg Hx   . Rectal cancer Neg Hx   . Stomach cancer Neg Hx      Social History   Substance and Sexual Activity  Drug Use No  Social History   Substance and Sexual Activity  Alcohol Use Yes  . Alcohol/week: 7.2 oz  . Types: 4 Glasses of wine, 8 Standard drinks or equivalent per week     Social History   Tobacco Use  Smoking Status Former Smoker  . Packs/day: 0.50  . Years: 20.00  . Pack years: 10.00  . Types: Cigarettes  . Last attempt to quit: 06/19/1996  . Years since quitting: 21.3  Smokeless Tobacco Never Used     Outpatient Encounter Medications as of 10/30/2017  Medication Sig  . Ascorbic Acid (VITAMIN C) 1000 MG tablet Take 1,000 mg by mouth daily.  Marland Kitchen desonide (DESOWEN) 0.05 % ointment daily as needed.  . Esomeprazole Magnesium (NEXIUM PO) Take by mouth. OTC- every other day  . Omega-3 Fatty Acids (FISH OIL PO) Take by mouth daily.  . valsartan-hydrochlorothiazide (DIOVAN-HCT) 160-25 MG tablet TAKE 0.5 TABLETS BY MOUTH DAILY.   No  facility-administered encounter medications on file as of 10/30/2017.     Allergies: Patient has no known allergies.  Body mass index is 32.48 kg/m.  Blood pressure 128/84, pulse 76, height 5' 4.5" (1.638 m), weight 192 lb 3.2 oz (87.2 kg), last menstrual period 06/19/2012, SpO2 95 %.  Review of Systems  Constitutional: Positive for fatigue. Negative for activity change, appetite change, chills, diaphoresis, fever and unexpected weight change.  HENT: Negative for congestion.   Eyes: Negative for visual disturbance.  Respiratory: Negative for cough, chest tightness, shortness of breath, wheezing and stridor.   Cardiovascular: Negative for chest pain, palpitations and leg swelling.  Gastrointestinal: Negative for abdominal distention, abdominal pain, blood in stool, constipation, diarrhea, nausea and vomiting.  Endocrine: Negative for cold intolerance, heat intolerance, polydipsia, polyphagia and polyuria.  Genitourinary: Negative for difficulty urinating, flank pain and hematuria.  Musculoskeletal: Negative for arthralgias, back pain, gait problem, joint swelling, myalgias, neck pain and neck stiffness.  Skin: Negative for color change, pallor, rash and wound.  Neurological: Negative for dizziness and headaches.  Hematological: Does not bruise/bleed easily.  Psychiatric/Behavioral: Negative for dysphoric mood, hallucinations, self-injury, sleep disturbance and suicidal ideas. The patient is not nervous/anxious and is not hyperactive.        Objective:   Physical Exam  Constitutional: She is oriented to person, place, and time. She appears well-developed and well-nourished. No distress.  HENT:  Head: Normocephalic and atraumatic.  Right Ear: External ear normal. Tympanic membrane is not erythematous and not bulging. No decreased hearing is noted.  Left Ear: External ear normal. Tympanic membrane is not erythematous and not bulging. No decreased hearing is noted.  Nose: No mucosal  edema or rhinorrhea. Right sinus exhibits no maxillary sinus tenderness and no frontal sinus tenderness. Left sinus exhibits no maxillary sinus tenderness and no frontal sinus tenderness.  Mouth/Throat: Uvula is midline, oropharynx is clear and moist and mucous membranes are normal.  Eyes: Pupils are equal, round, and reactive to light. Conjunctivae are normal.  Neck: Normal range of motion. Neck supple.  Cardiovascular: Normal rate, regular rhythm, normal heart sounds and intact distal pulses.  No murmur heard. Pulmonary/Chest: Effort normal and breath sounds normal. No respiratory distress. She has no wheezes. She has no rales. She exhibits no tenderness.  Abdominal: Soft. Bowel sounds are normal. She exhibits no distension and no mass. There is no tenderness. There is no rebound and no guarding.  Musculoskeletal: Normal range of motion.  Lymphadenopathy:    She has no cervical adenopathy.  Neurological: She is alert and oriented to person,  place, and time. Coordination normal.  Skin: Skin is warm and dry. No rash noted. She is not diaphoretic. No erythema. No pallor.  Psychiatric: She has a normal mood and affect. Her behavior is normal. Judgment and thought content normal.  Nursing note and vitals reviewed.     Assessment & Plan:   1. Essential hypertension, benign   2. Healthcare maintenance   3. Anemia, unspecified type     Essential hypertension, benign She has been off Valsartan/HCTZ >1.5 weeks BP 128/84, HR 76 She will remain off for the next month and monitor BP/HR F/u in one month and if readings are at goal will remain off antihypertensive  Healthcare maintenance Please check your blood pressure and heart rate daily. Please bring log with at follow-up appt in 4 weeks. Continue with Weight Watchers and increase regular exercise. We will recheck CBC, re: possible anemia, at follow-up.  Anemia 10/23/17 H/H 10.8/33.23 Oct 2016 H/H 13.2/39.6 Denies active bleeding, will  recheck CBC at f/u in 4 weeks    FOLLOW-UP:  Return in about 1 month (around 11/27/2017) for Regular Follow Up, HTN.

## 2017-10-30 NOTE — Assessment & Plan Note (Signed)
10/23/17 H/H 10.8/33.23 Oct 2016 H/H 13.2/39.6 Denies active bleeding, will recheck CBC at f/u in 4 weeks

## 2017-10-30 NOTE — Assessment & Plan Note (Signed)
She has been off Valsartan/HCTZ >1.5 weeks BP 128/84, HR 76 She will remain off for the next month and monitor BP/HR F/u in one month and if readings are at goal will remain off antihypertensive

## 2017-11-13 ENCOUNTER — Other Ambulatory Visit: Payer: No Typology Code available for payment source

## 2017-11-20 ENCOUNTER — Encounter: Payer: No Typology Code available for payment source | Admitting: Adult Health

## 2017-12-10 ENCOUNTER — Encounter: Payer: Self-pay | Admitting: Adult Health

## 2017-12-10 ENCOUNTER — Ambulatory Visit (INDEPENDENT_AMBULATORY_CARE_PROVIDER_SITE_OTHER): Payer: No Typology Code available for payment source | Admitting: Adult Health

## 2017-12-10 VITALS — BP 130/81 | HR 72 | Ht 64.5 in | Wt 186.4 lb

## 2017-12-10 DIAGNOSIS — M19049 Primary osteoarthritis, unspecified hand: Secondary | ICD-10-CM | POA: Insufficient documentation

## 2017-12-10 DIAGNOSIS — Z0000 Encounter for general adult medical examination without abnormal findings: Secondary | ICD-10-CM

## 2017-12-10 DIAGNOSIS — I10 Essential (primary) hypertension: Secondary | ICD-10-CM

## 2017-12-10 MED ORDER — VALSARTAN-HYDROCHLOROTHIAZIDE 160-25 MG PO TABS: 0.5000 | ORAL_TABLET | Freq: Every day | ORAL | 2 refills | Status: DC

## 2017-12-10 NOTE — Progress Notes (Signed)
Subjective:    Patient ID: Marissa Duarte, female    DOB: [birth year], 57 y.o.   MRN: [id number]  Hypertension  Pertinent negatives include no chest pain, headaches, neck pain, palpitations or shortness of breath.  :  03/05/17 OV: Ms. Marissa Duarte presents for f/u: medical wt loss. This will be the 6 month on Phentermine 37.5mg , she has been taking the medication each morning and denies SE.  She preforms cardio training 3 days a week- 20mins on either bike of treadmill.  Daily diet is typically- fruit with whole grains for breakfast, fruit snake mid-morning, lunch/dinner lean protein with vegetables.  She estimates to drink >80 ounces water/day and does not use tobacco. She consumes a glass of wine several evenings a week. She feels "wonderful and my clothes fit better than they have in years".  10/30/17 OV: Ms. Marissa Duarte is here for CPE  The 10-year ASCVD risk score Marissa Duarte(Marissa Duarte., et al., 2013) is: 4.3%   Values used to calculate the score:     Age: [age] years     Sex: Female     Is Non-Hispanic African American: No     Diabetic: No     Tobacco smoker: No     Systolic Blood Pressure: 130 mmHg     Is BP treated: Yes     HDL Cholesterol: 44 mg/dL     Total Cholesterol: 175 mg/dL  LDL- [id number] Reviewed all recent labs; slight drop in H/H.  She reports hx of anemia during pregnancy, she denies hematuria/hematochezia She started Weight Watchers 1 April and has been exercising 3 times/week- stationary bike and walking Her ultimate wt loss goal- 20lbs, current wt 192 She has been off her antihypertensive >1.5 weeks and her BP here and at her OB/GYN last week were both at goal. She would like to try TLC to manage BP.  She has been on Valsartan/HCTZ 160/25mg  >3 years She estimates to drink >100 oz water/day and continues to abstain from tobacco use.  12/10/17 OV: Ms. Marissa Duarte presents for f/u: HTN She has been off valsartan/HCTZ 160/25 >4 weeks Home readings- SBP 130-150s DBP 80-100s HR 70-80 She denies  CP/dyspnea/HA/dizziness/palpitations She continues with Weight Watchers and lost >6 lbs since last OV 10/30/17 She has not been exercising, but has home gym equipment and plans "on getting moving again" She continues to abstain from tobacco and rarely consumes ETOH  Patient Care Team    Relationship Specialty Notifications Start End  Julaine Fusianford, Katy D, NP PCP - General Family Medicine  08/23/16   Mammography, [facility]  Diagnostic Radiology  10/30/17     Patient Active Problem List   Diagnosis Date Noted  . 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 Healthcare maintenance 10/30/2017  . Anemia 10/30/2017  . Fatigue 08/23/2016  . Obesity (BMI 35.0-39.9 without comorbidity) 08/23/2016  . Essential hypertension, benign 03/05/2014     Past Medical History:  Diagnosis Date  . Acid reflux   . Esophagitis 3/11    noted on EGD  . Hypertension   . Kidney stone      Past Surgical History:  Procedure Laterality Date  . BREAST LUMPECTOMY  age 57   fibroadenoma  . COLONOSCOPY    . TUBAL LIGATION  1990  . UPPER GASTROINTESTINAL ENDOSCOPY    . WISDOM TOOTH EXTRACTION       Family History  Problem Relation Age of Onset  . Hypertension Father   . Polycystic kidney disease Father   . Cancer Father  lung  . Hypertension Mother   . Infertility Sister   . Hypertension Sister   . Polycystic kidney disease Sister   . Healthy Brother   . Heart attack Maternal Uncle   . Diabetes Maternal Grandfather   . Colon cancer Neg Hx   . Esophageal cancer Neg Hx   . Rectal cancer Neg Hx   . Stomach cancer Neg Hx      Social History   Substance and Sexual Activity  Drug Use No     Social History   Substance and Sexual Activity  Alcohol Use Yes  . Alcohol/week: 7.2 oz  . Types: 4 Glasses of wine, 8 Standard drinks or equivalent per week     Social History   Tobacco Use  Smoking Status Former Smoker  . Packs/day: 0.50  . Years: 20.00  . Pack years: 10.00  . Types: Cigarettes  . Last  attempt to quit: 06/19/1996  . Years since quitting: 21.4  Smokeless Tobacco Never Used     Outpatient Encounter Medications as of 12/10/2017  Medication Sig  . Ascorbic Acid (VITAMIN C) 1000 MG tablet Take 1,000 mg by mouth daily.  Marland Kitchen desonide (DESOWEN) 0.05 % ointment daily as needed.  . Esomeprazole Magnesium (NEXIUM PO) Take by mouth. OTC- every other day  . Omega-3 Fatty Acids (FISH OIL PO) Take by mouth daily.  . valsartan-hydrochlorothiazide (DIOVAN-HCT) 160-25 MG tablet Take 0.5 tablets by mouth daily.  . [DISCONTINUED] valsartan-hydrochlorothiazide (DIOVAN-HCT) 160-25 MG tablet TAKE 0.5 TABLETS BY MOUTH DAILY.   No facility-administered encounter medications on file as of 12/10/2017.     Allergies: Patient has no known allergies.  Body mass index is 31.5 kg/m.  Blood pressure 130/81, pulse 72, height 5' 4.5" (1.638 m), weight 186 lb 6.4 oz (84.6 kg), last menstrual period 06/19/2012, SpO2 96 %.  Review of Systems  Constitutional: Positive for fatigue. Negative for activity change, appetite change, chills, diaphoresis, fever and unexpected weight change.  HENT: Negative for congestion.   Eyes: Negative for visual disturbance.  Respiratory: Negative for cough, chest tightness, shortness of breath, wheezing and stridor.   Cardiovascular: Negative for chest pain, palpitations and leg swelling.  Gastrointestinal: Negative for abdominal distention, abdominal pain, blood in stool, constipation, diarrhea, nausea and vomiting.  Endocrine: Negative for cold intolerance, heat intolerance, polydipsia, polyphagia and polyuria.  Genitourinary: Negative for difficulty urinating, flank pain and hematuria.  Musculoskeletal: Negative for arthralgias, back pain, gait problem, joint swelling, myalgias, neck pain and neck stiffness.  Skin: Negative for color change, pallor, rash and wound.  Neurological: Negative for dizziness and headaches.  Hematological: Does not bruise/bleed easily.   Psychiatric/Behavioral: Negative for dysphoric mood, hallucinations, self-injury, sleep disturbance and suicidal ideas. The patient is not nervous/anxious and is not hyperactive.        Objective:   Physical Exam  Constitutional: She is oriented to person, place, and time. She appears well-developed and well-nourished. No distress.  HENT:  Head: Normocephalic and atraumatic.  Right Ear: External ear normal. Tympanic membrane is not erythematous and not bulging. No decreased hearing is noted.  Left Ear: External ear normal. Tympanic membrane is not erythematous and not bulging. No decreased hearing is noted.  Nose: No mucosal edema or rhinorrhea. Right sinus exhibits no maxillary sinus tenderness and no frontal sinus tenderness. Left sinus exhibits no maxillary sinus tenderness and no frontal sinus tenderness.  Mouth/Throat: Uvula is midline, oropharynx is clear and moist and mucous membranes are normal.  Eyes: Pupils are equal, round, and  reactive to light. Conjunctivae are normal.  Neck: Normal range of motion. Neck supple.  Cardiovascular: Normal rate, regular rhythm, normal heart sounds and intact distal pulses.  No murmur heard. Pulmonary/Chest: Effort normal and breath sounds normal. No respiratory distress. She has no wheezes. She has no rales. She exhibits no tenderness.  Abdominal: Soft. Bowel sounds are normal. She exhibits no distension and no mass. There is no tenderness. There is no rebound and no guarding.  Musculoskeletal: Normal range of motion. She exhibits edema and tenderness.       Right hand: She exhibits tenderness and swelling.  Edema/tenderness R 3rd/5th fingers, normal ROM  Lymphadenopathy:    She has no cervical adenopathy.  Neurological: She is alert and oriented to person, place, and time. Coordination normal.  Skin: Skin is warm and dry. No rash noted. She is not diaphoretic. No erythema. No pallor.  Psychiatric: She has a normal mood and affect. Her behavior  is normal. Judgment and thought content normal.  Nursing note and vitals reviewed.     Assessment & Plan:   1. Essential hypertension, benign   2. Arthritis of finger   3. Healthcare maintenance     Essential hypertension, benign First BP check: 161/106, HR 72 Recheck after lying on L side >5 mins: 130/81, HR 70 Home readings- SBP 130-150s DBP 80-100s HR 70-80 6 lb wt loss since last 10/30/17 Re-started on Valsartan/HCTZ 160/25mg  QD   Arthritis of finger Recommend OTC Tumeric per manufacturer's instructions  Healthcare maintenance Please re-start Valsartan/HCTZ 160/25mg  once daily.   Follow Heart Healthy diet Increase regular exercise.  Recommend at least 30 minutes daily, 5 days per week of walking, jogging, biking, swimming, YouTube/Pinterest workout videos. Recommend OTC Tumeric per manufacturer's instructions. Follow-up in 6 months.    FOLLOW-UP:  Return in about 6 months (around 06/11/2018).

## 2017-12-10 NOTE — Assessment & Plan Note (Signed)
Recommend OTC Tumeric per manufacturer's instructions

## 2017-12-10 NOTE — Assessment & Plan Note (Signed)
Please re-start Valsartan/HCTZ 160/25mg  once daily.   Follow Heart Healthy diet Increase regular exercise.  Recommend at least 30 minutes daily, 5 days per week of walking, jogging, biking, swimming, YouTube/Pinterest workout videos. Recommend OTC Tumeric per manufacturer's instructions. Follow-up in 6 months.

## 2017-12-10 NOTE — Patient Instructions (Signed)
Hypertension Hypertension, commonly called high blood pressure, is when the force of blood pumping through the arteries is too strong. The arteries are the blood vessels that carry blood from the heart throughout the body. Hypertension forces the heart to work harder to pump blood and may cause arteries to become narrow or stiff. Having untreated or uncontrolled hypertension can cause heart attacks, strokes, kidney disease, and other problems. A blood pressure reading consists of a higher number over a lower number. Ideally, your blood pressure should be below 120/80. The first ("top") number is called the systolic pressure. It is a measure of the pressure in your arteries as your heart beats. The second ("bottom") number is called the diastolic pressure. It is a measure of the pressure in your arteries as the heart relaxes. What are the causes? The cause of this condition is not known. What increases the risk? Some risk factors for high blood pressure are under your control. Others are not. Factors you can change  Smoking.  Having type 2 diabetes mellitus, high cholesterol, or both.  Not getting enough exercise or physical activity.  Being overweight.  Having too much fat, sugar, calories, or salt (sodium) in your diet.  Drinking too much alcohol. Factors that are difficult or impossible to change  Having chronic kidney disease.  Having a family history of high blood pressure.  Age. Risk increases with age.  Race. You may be at higher risk if you are African-American.  Gender. Men are at higher risk than women before age 45. After age 65, women are at higher risk than men.  Having obstructive sleep apnea.  Stress. What are the signs or symptoms? Extremely high blood pressure (hypertensive crisis) may cause:  Headache.  Anxiety.  Shortness of breath.  Nosebleed.  Nausea and vomiting.  Severe chest pain.  Jerky movements you cannot control (seizures).  How is this  diagnosed? This condition is diagnosed by measuring your blood pressure while you are seated, with your arm resting on a surface. The cuff of the blood pressure monitor will be placed directly against the skin of your upper arm at the level of your heart. It should be measured at least twice using the same arm. Certain conditions can cause a difference in blood pressure between your right and left arms. Certain factors can cause blood pressure readings to be lower or higher than normal (elevated) for a short period of time:  When your blood pressure is higher when you are in a health care provider's office than when you are at home, this is called white coat hypertension. Most people with this condition do not need medicines.  When your blood pressure is higher at home than when you are in a health care provider's office, this is called masked hypertension. Most people with this condition may need medicines to control blood pressure.  If you have a high blood pressure reading during one visit or you have normal blood pressure with other risk factors:  You may be asked to return on a different day to have your blood pressure checked again.  You may be asked to monitor your blood pressure at home for 1 week or longer.  If you are diagnosed with hypertension, you may have other blood or imaging tests to help your health care provider understand your overall risk for other conditions. How is this treated? This condition is treated by making healthy lifestyle changes, such as eating healthy foods, exercising more, and reducing your alcohol intake. Your   health care provider may prescribe medicine if lifestyle changes are not enough to get your blood pressure under control, and if:  Your systolic blood pressure is above 130.  Your diastolic blood pressure is above 80.  Your personal target blood pressure may vary depending on your medical conditions, your age, and other factors. Follow these  instructions at home: Eating and drinking  Eat a diet that is high in fiber and potassium, and low in sodium, added sugar, and fat. An example eating plan is called the DASH (Dietary Approaches to Stop Hypertension) diet. To eat this way: ? Eat plenty of fresh fruits and vegetables. Try to fill half of your plate at each meal with fruits and vegetables. ? Eat whole grains, such as whole wheat pasta, brown rice, or whole grain bread. Fill about one quarter of your plate with whole grains. ? Eat or drink low-fat dairy products, such as skim milk or low-fat yogurt. ? Avoid fatty cuts of meat, processed or cured meats, and poultry with skin. Fill about one quarter of your plate with lean proteins, such as fish, chicken without skin, beans, eggs, and tofu. ? Avoid premade and processed foods. These tend to be higher in sodium, added sugar, and fat.  Reduce your daily sodium intake. Most people with hypertension should eat less than 1,500 mg of sodium a day.  Limit alcohol intake to no more than 1 drink a day for nonpregnant women and 2 drinks a day for men. One drink equals 12 oz of beer, 5 oz of wine, or 1 oz of hard liquor. Lifestyle  Work with your health care provider to maintain a healthy body weight or to lose weight. Ask what an ideal weight is for you.  Get at least 30 minutes of exercise that causes your heart to beat faster (aerobic exercise) most days of the week. Activities may include walking, swimming, or biking.  Include exercise to strengthen your muscles (resistance exercise), such as pilates or lifting weights, as part of your weekly exercise routine. Try to do these types of exercises for 30 minutes at least 3 days a week.  Do not use any products that contain nicotine or tobacco, such as cigarettes and e-cigarettes. If you need help quitting, ask your health care provider.  Monitor your blood pressure at home as told by your health care provider.  Keep all follow-up visits as  told by your health care provider. This is important. Medicines  Take over-the-counter and prescription medicines only as told by your health care provider. Follow directions carefully. Blood pressure medicines must be taken as prescribed.  Do not skip doses of blood pressure medicine. Doing this puts you at risk for problems and can make the medicine less effective.  Ask your health care provider about side effects or reactions to medicines that you should watch for. Contact a health care provider if:  You think you are having a reaction to a medicine you are taking.  You have headaches that keep coming back (recurring).  You feel dizzy.  You have swelling in your ankles.  You have trouble with your vision. Get help right away if:  You develop a severe headache or confusion.  You have unusual weakness or numbness.  You feel faint.  You have severe pain in your chest or abdomen.  You vomit repeatedly.  You have trouble breathing. Summary  Hypertension is when the force of blood pumping through your arteries is too strong. If this condition is not   controlled, it may put you at risk for serious complications.  Your personal target blood pressure may vary depending on your medical conditions, your age, and other factors. For most people, a normal blood pressure is less than 120/80.  Hypertension is treated with lifestyle changes, medicines, or a combination of both. Lifestyle changes include weight loss, eating a healthy, low-sodium diet, exercising more, and limiting alcohol. This information is not intended to replace advice given to you by your health care provider. Make sure you discuss any questions you have with your health care provider. Document Released: 06/05/2005 Document Revised: 05/03/2016 Document Reviewed: 05/03/2016 Elsevier Interactive Patient Education  2018 [employer]   Arthritis Arthritis is a term that is commonly used to refer to joint pain or joint  disease. There are more than 100 types of arthritis. What are the causes? The most common cause of this condition is wear and tear of a joint. Other causes include:  Gout.  Inflammation of a joint.  An infection of a joint.  Sprains and other injuries near the joint.  A drug reaction or allergic reaction.  In some cases, the cause may not be known. What are the signs or symptoms? The main symptom of this condition is pain in the joint with movement. Other symptoms include:  Redness, swelling, or stiffness at a joint.  Warmth coming from the joint.  Fever.  Overall feeling of illness.  How is this diagnosed? This condition may be diagnosed with a physical exam and tests, including:  Blood tests.  Urine tests.  Imaging tests, such as MRI, X-rays, or a CT scan.  Sometimes, fluid is removed from a joint for testing. How is this treated? Treatment for this condition may involve:  Treatment of the cause, if it is known.  Rest.  Raising (elevating) the joint.  Applying cold or hot packs to the joint.  Medicines to improve symptoms and reduce inflammation.  Injections of a steroid such as cortisone into the joint to help reduce pain and inflammation.  Depending on the cause of your arthritis, you may need to make lifestyle changes to reduce stress on your joint. These changes may include exercising more and losing weight. Follow these instructions at home: Medicines  Take over-the-counter and prescription medicines only as told by your health care provider.  Do not take aspirin to relieve pain if gout is suspected. Activity  Rest your joint if told by your health care provider. Rest is important when your disease is active and your joint feels painful, swollen, or stiff.  Avoid activities that make the pain worse. It is important to balance activity with rest.  Exercise your joint regularly with range-of-motion exercises as told by your health care provider.  Try doing low-impact exercise, such as: ? Swimming. ? Water aerobics. ? Biking. ? Walking. Joint Care   If your joint is swollen, keep it elevated if told by your health care provider.  If your joint feels stiff in the morning, try taking a warm shower.  If directed, apply heat to the joint. If you have diabetes, do not apply heat without permission from your health care provider. ? Put a towel between the joint and the hot pack or heating pad. ? Leave the heat on the area for 20-30 minutes.  If directed, apply ice to the joint: ? Put ice in a plastic bag. ? Place a towel between your skin and the bag. ? Leave the ice on for 20 minutes, 2-3 times per  day.  Keep all follow-up visits as told by your health care provider. This is important. Contact a health care provider if:  The pain gets worse.  You have a fever. Get help right away if:  You develop severe joint pain, swelling, or redness.  Many joints become painful and swollen.  You develop severe back pain.  You develop severe weakness in your leg.  You cannot control your bladder or bowels. This information is not intended to replace advice given to you by your health care provider. Make sure you discuss any questions you have with your health care provider. Document Released: 07/13/2004 Document Revised: 11/11/2015 Document Reviewed: 08/31/2014 Elsevier Interactive Patient Education  2018 [employer].  Please re-start Valsartan/HCTZ 160/25mg  once daily.   Follow Heart Healthy diet Increase regular exercise.  Recommend at least 30 minutes daily, 5 days per week of walking, jogging, biking, swimming, YouTube/Pinterest workout videos. Recommend OTC Tumeric per manufacturer's instructions. Follow-up in 6 months. NICE TO SEE YOU!

## 2017-12-10 NOTE — Assessment & Plan Note (Addendum)
First BP check: 161/106, HR 72 Recheck after lying on L side >5 mins: 130/81, HR 70 Home readings- SBP 130-150s DBP 80-100s HR 70-80 6 lb wt loss since last 10/30/17 Re-started on Valsartan/HCTZ 160/25mg  QD

## 2018-06-03 ENCOUNTER — Ambulatory Visit: Payer: No Typology Code available for payment source | Admitting: Adult Health

## 2018-12-10 ENCOUNTER — Encounter: Payer: Self-pay | Admitting: Obstetrics & Gynecology

## 2019-01-22 ENCOUNTER — Other Ambulatory Visit: Payer: Self-pay

## 2019-01-24 ENCOUNTER — Other Ambulatory Visit: Payer: Self-pay

## 2019-01-24 ENCOUNTER — Encounter: Payer: Self-pay | Admitting: Obstetrics & Gynecology

## 2019-01-24 ENCOUNTER — Ambulatory Visit (INDEPENDENT_AMBULATORY_CARE_PROVIDER_SITE_OTHER): Payer: 59 | Admitting: Obstetrics & Gynecology

## 2019-01-24 ENCOUNTER — Encounter

## 2019-01-24 VITALS — BP 122/90 | HR 88 | Temp 97.5°F | Ht 64.0 in | Wt 194.0 lb

## 2019-01-24 DIAGNOSIS — Z01419 Encounter for gynecological examination (general) (routine) without abnormal findings: Secondary | ICD-10-CM | POA: Diagnosis not present

## 2019-01-24 NOTE — Progress Notes (Signed)
58 y.o. G2P2 Married White or Caucasian female here for annual exam.  Doing well.  Denies vaginal bleeding.  Granddaughter is 18 months.    Patient's last menstrual period was 06/19/2012.          Sexually active: Yes.    The current method of family planning is tubal ligation.    Exercising: No.   Smoker:  no  Health Maintenance: Pap:  07/28/16 Neg. HR HPV:neg   03/05/14 neg  History of abnormal Pap:  no MMG:  10/18/17 BIRADS2:benign. 12/2018 Normal - [facility]  Colonoscopy:  09/28/16 f/u 10 years BMD:   never TDaP:  2017 Pneumonia vaccine(s):  no Shingrix:   D/w pt today Hep C testing: 09/08/16 neg  Screening Labs: PCP   reports that she quit smoking about 22 years ago. Her smoking use included cigarettes. She has a 10.00 pack-year smoking history. She has never used smokeless tobacco. She reports current alcohol use of about 6.0 standard drinks of alcohol per week. She reports that she does not use drugs.  Past Medical History:  Diagnosis Date  . Acid reflux   . Esophagitis 3/11    noted on EGD  . Hypertension   . Kidney stone     Past Surgical History:  Procedure Laterality Date  . BREAST LUMPECTOMY  age 58   fibroadenoma  . COLONOSCOPY    . TUBAL LIGATION  1990  . UPPER GASTROINTESTINAL ENDOSCOPY    . WISDOM TOOTH EXTRACTION      Current Outpatient Medications  Medication Sig Dispense Refill  . Ascorbic Acid (VITAMIN C) 1000 MG tablet Take 1,000 mg by mouth daily.    . Esomeprazole Magnesium (NEXIUM PO) Take by mouth. OTC- every other day    . Omega-3 Fatty Acids (FISH OIL PO) Take by mouth daily.    . valsartan-hydrochlorothiazide (DIOVAN-HCT) 160-25 MG tablet Take 0.5 tablets by mouth daily. 90 tablet 2   No current facility-administered medications for this visit.     Family History  Problem Relation Age of Onset  . Hypertension Father   . Polycystic kidney disease Father   . Cancer Father        lung  . Hypertension Mother   . Infertility Sister   .  Hypertension Sister   . Polycystic kidney disease Sister   . Healthy Brother   . Heart attack Maternal Uncle   . Diabetes Maternal Grandfather   . Colon cancer Neg Hx   . Esophageal cancer Neg Hx   . Rectal cancer Neg Hx   . Stomach cancer Neg Hx     Review of Systems  All other systems reviewed and are negative.   Exam:   BP 122/90   Pulse 88   Temp (!) 97.5 F (36.4 C) (Temporal)   Ht 5\' 4"  (1.626 m)   Wt 194 lb (88 kg)   LMP 06/19/2012   BMI 33.30 kg/m   Height: 5\' 4"  (162.6 cm)  Ht Readings from Last 3 Encounters:  01/24/19 5\' 4"  (1.626 m)  12/10/17 5' 4.5" (1.638 m)  10/30/17 5' 4.5" (1.638 m)    General appearance: alert, cooperative and appears stated age Head: Normocephalic, without obvious abnormality, atraumatic Neck: no adenopathy, supple, symmetrical, trachea midline and thyroid normal to inspection and palpation Lungs: clear to auscultation bilaterally Breasts: normal appearance, no masses or tenderness Heart: regular rate and rhythm Abdomen: soft, non-tender; bowel sounds normal; no masses,  no organomegaly Extremities: extremities normal, atraumatic, no cyanosis or edema Skin: Skin  color, texture, turgor normal. No rashes or lesions Lymph nodes: Cervical, supraclavicular, and axillary nodes normal. No abnormal inguinal nodes palpated Neurologic: Grossly normal   Pelvic: External genitalia:  no lesions              Urethra:  normal appearing urethra with no masses, tenderness or lesions              Bartholins and Skenes: normal                 Vagina: normal appearing vagina with normal color and discharge, no lesions              Cervix: no lesions              Pap taken: No. Bimanual Exam:  Uterus:  normal size, contour, position, consistency, mobility, non-tender              Adnexa: normal adnexa and no mass, fullness, tenderness               Rectovaginal: Confirms               Anus:  normal sphincter tone, no lesions  Chaperone was present  for exam.  A:  Well Woman with normal exam PMP, no HRT Family hx of polycystic kidney disease.  Negative ultrasound 2012. Hypertension  P:   Mammogram guidelines reviewed.  We called for her MMG as it is not in Epic. pap smear with neg HR HPV 2018.  Not indicated today. Colonoscopy is UTD Plan BMD by age 23 Lab work will be done with Mina Marble with upcoming appt. return annually or prn

## 2019-02-20 ENCOUNTER — Other Ambulatory Visit: Payer: Self-pay | Admitting: Adult Health

## 2019-02-20 ENCOUNTER — Telehealth: Payer: Self-pay

## 2019-02-20 NOTE — Telephone Encounter (Signed)
Please call pt to schedule f/u before any further refills.  Charyl Bigger, CMA

## 2019-03-04 ENCOUNTER — Other Ambulatory Visit: Payer: Self-pay

## 2019-03-04 ENCOUNTER — Other Ambulatory Visit: Payer: 59

## 2019-03-04 DIAGNOSIS — Z0000 Encounter for general adult medical examination without abnormal findings: Secondary | ICD-10-CM

## 2019-03-04 DIAGNOSIS — D649 Anemia, unspecified: Secondary | ICD-10-CM

## 2019-03-04 DIAGNOSIS — I10 Essential (primary) hypertension: Secondary | ICD-10-CM

## 2019-03-05 LAB — COMPREHENSIVE METABOLIC PANEL
ALT: 17 IU/L (ref 0–32)
AST: 17 IU/L (ref 0–40)
Albumin/Globulin Ratio: 2 (ref 1.2–2.2)
Albumin: 4.3 g/dL (ref 3.8–4.9)
Alkaline Phosphatase: 67 IU/L (ref 39–117)
BUN/Creatinine Ratio: 19 (ref 12–28)
BUN: 14 mg/dL (ref 8–27)
Bilirubin Total: 0.8 mg/dL (ref 0.0–1.2)
CO2: 26 mmol/L (ref 20–29)
Calcium: 9.3 mg/dL (ref 8.7–10.3)
Chloride: 102 mmol/L (ref 96–106)
Creatinine, Ser: 0.74 mg/dL (ref 0.57–1.00)
GFR calc Af Amer: 102 mL/min/{1.73_m2} (ref >59)
GFR calc non Af Amer: 88 mL/min/{1.73_m2} (ref >59)
Globulin, Total: 2.1 g/dL (ref 1.5–4.5)
Glucose: 98 mg/dL (ref 65–99)
Potassium: 4.5 mmol/L (ref 3.5–5.2)
Sodium: 142 mmol/L (ref 134–144)
Total Protein: 6.4 g/dL (ref 6.0–8.5)

## 2019-03-05 LAB — CBC WITH DIFFERENTIAL/PLATELET
Basophils Absolute: 0.1 10*3/uL (ref 0.0–0.2)
Basos: 1 %
EOS (ABSOLUTE): 0.2 10*3/uL (ref 0.0–0.4)
Eos: 3 %
Hematocrit: 41.5 % (ref 34.0–46.6)
Hemoglobin: 14.2 g/dL (ref 11.1–15.9)
Immature Grans (Abs): 0 10*3/uL (ref 0.0–0.1)
Immature Granulocytes: 0 %
Lymphocytes Absolute: 1.3 10*3/uL (ref 0.7–3.1)
Lymphs: 27 %
MCH: 30.9 pg (ref 26.6–33.0)
MCHC: 34.2 g/dL (ref 31.5–35.7)
MCV: 90 fL (ref 79–97)
Monocytes Absolute: 0.4 10*3/uL (ref 0.1–0.9)
Monocytes: 9 %
Neutrophils Absolute: 2.9 10*3/uL (ref 1.4–7.0)
Neutrophils: 60 %
Platelets: 215 10*3/uL (ref 150–450)
RBC: 4.59 x10E6/uL (ref 3.77–5.28)
RDW: 12.9 % (ref 11.7–15.4)
WBC: 4.9 10*3/uL (ref 3.4–10.8)

## 2019-03-05 LAB — TSH: TSH: 1.95 u[IU]/mL (ref 0.450–4.500)

## 2019-03-05 LAB — HEMOGLOBIN A1C
Est. average glucose Bld gHb Est-mCnc: 103 mg/dL
Hgb A1c MFr Bld: 5.2 % (ref 4.8–5.6)

## 2019-03-05 LAB — LIPID PANEL
Chol/HDL Ratio: 4.2 ratio (ref 0.0–4.4)
Cholesterol, Total: 216 mg/dL — ABNORMAL HIGH (ref 100–199)
HDL: 51 mg/dL (ref >39)
LDL Chol Calc (NIH): 140 mg/dL — ABNORMAL HIGH (ref 0–99)
Triglycerides: 141 mg/dL (ref 0–149)
VLDL Cholesterol Cal: 25 mg/dL (ref 5–40)

## 2019-03-10 NOTE — Progress Notes (Deleted)
Subjective:    Patient ID: Marissa Duarte, female    DOB: [birth year], 58 y.o.   MRN: [id number]  HPI:  Marissa Duarte is here for CPE  03/04/2019 Labs- TSH-WNL, 1.950  The 10-year ASCVD risk score [name] DC Brooke Bonito., et al., 2013) is: 4.5%  Values used to calculate the score:   Age: 3 years   Sex: Female   Is Non-Hispanic African American: No   Diabetic: No   Tobacco smoker: No   Systolic Blood Pressure: 017 mmHg   Is BP treated: Yes   HDL Cholesterol: 51 mg/dL   Total Cholesterol: 216 mg/dL  LDL-140  A1c-5.2  CMP-stable  CBC-stable  Healthcare Maintenance: PAP- Mammogram- Colonoscopy- Immunizations-  Patient Care Team    Relationship Specialty Notifications Start End  Esaw Grandchild, NP PCP - General Family Medicine  08/23/16   Mammography, [facility]  Diagnostic Radiology  10/30/17     Patient Active Problem List   Diagnosis Date Noted  . 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 Healthcare maintenance 10/30/2017  . Anemia 10/30/2017  . Fatigue 08/23/2016  . Obesity (BMI 35.0-39.9 without comorbidity) 08/23/2016  . Essential hypertension, benign 03/05/2014     Past Medical History:  Diagnosis Date  . Acid reflux   . Esophagitis 3/11    noted on EGD  . Hypertension   . Kidney stone      Past Surgical History:  Procedure Laterality Date  . BREAST LUMPECTOMY  age 38   fibroadenoma  . COLONOSCOPY    . TUBAL LIGATION  1990  . UPPER GASTROINTESTINAL ENDOSCOPY    . WISDOM TOOTH EXTRACTION       Family History  Problem Relation Age of Onset  . Hypertension Father   . Polycystic kidney disease Father   . Cancer Father        lung  . Hypertension Mother   . Infertility Sister   . Hypertension Sister   . Polycystic kidney disease Sister   . Healthy Brother   . Heart attack Maternal Uncle   . Diabetes Maternal Grandfather   . Colon cancer Neg Hx   . Esophageal cancer Neg Hx   . Rectal cancer Neg Hx   . Stomach cancer Neg Hx       Social History   Substance and Sexual Activity  Drug Use No     Social History   Substance and Sexual Activity  Alcohol Use Yes  . Alcohol/week: 6.0 standard drinks  . Types: 6 Glasses of wine per week     Social History   Tobacco Use  Smoking Status Former Smoker  . Packs/day: 0.50  . Years: 20.00  . Pack years: 10.00  . Types: Cigarettes  . Quit date: 06/19/1996  . Years since quitting: 22.7  Smokeless Tobacco Never Used     Outpatient Encounter Medications as of 03/11/2019  Medication Sig  . Ascorbic Acid (VITAMIN C) 1000 MG tablet Take 1,000 mg by mouth daily.  . Esomeprazole Magnesium (NEXIUM PO) Take by mouth. OTC- every other day  . Omega-3 Fatty Acids (FISH OIL PO) Take by mouth daily.  . valsartan-hydrochlorothiazide (DIOVAN-HCT) 160-25 MG tablet Take 0.5 tablets by mouth daily. OFFICE VISIT REQUIRED PRIOR TO ANY FURTHER REFILLS.   No facility-administered encounter medications on file as of 03/11/2019.     Allergies: Patient has no known allergies.  There is no height or weight on file to calculate BMI.  Last menstrual period 06/19/2012.     Review of Systems  Objective:   Physical Exam        Assessment & Plan:  No diagnosis found.  No problem-specific Assessment & Plan notes found for this encounter.    FOLLOW-UP:  No follow-ups on file.

## 2019-03-11 ENCOUNTER — Encounter: Payer: No Typology Code available for payment source | Admitting: Adult Health

## 2019-03-16 ENCOUNTER — Other Ambulatory Visit: Payer: Self-pay | Admitting: Adult Health

## 2019-04-01 ENCOUNTER — Other Ambulatory Visit: Payer: Self-pay

## 2019-04-01 ENCOUNTER — Ambulatory Visit (INDEPENDENT_AMBULATORY_CARE_PROVIDER_SITE_OTHER): Payer: 59 | Admitting: Adult Health

## 2019-04-01 ENCOUNTER — Encounter: Payer: Self-pay | Admitting: Adult Health

## 2019-04-01 VITALS — BP 135/92 | HR 87 | Temp 98.2°F | Ht 63.5 in | Wt 196.9 lb

## 2019-04-01 DIAGNOSIS — E7800 Pure hypercholesterolemia, unspecified: Secondary | ICD-10-CM | POA: Insufficient documentation

## 2019-04-01 DIAGNOSIS — Z23 Encounter for immunization: Secondary | ICD-10-CM | POA: Diagnosis not present

## 2019-04-01 DIAGNOSIS — E785 Hyperlipidemia, unspecified: Secondary | ICD-10-CM | POA: Insufficient documentation

## 2019-04-01 DIAGNOSIS — I10 Essential (primary) hypertension: Secondary | ICD-10-CM

## 2019-04-01 DIAGNOSIS — Z0000 Encounter for general adult medical examination without abnormal findings: Secondary | ICD-10-CM | POA: Diagnosis not present

## 2019-04-01 DIAGNOSIS — D649 Anemia, unspecified: Secondary | ICD-10-CM

## 2019-04-01 NOTE — Progress Notes (Signed)
Subjective:    Patient ID: Marissa Duarte, female    DOB: May 02, 1961, 58 y.o.   MRN: [id number]  HPI:12/10/17 OV: Marissa Duarte presents for f/u: HTN She has been off valsartan/HCTZ 160/25 >4 weeks Home readings- SBP 130-150s DBP 80-100s HR 70-80 She denies CP/dyspnea/HA/dizziness/palpitations She continues with Weight [employer] and lost >6 lbs since last OV 10/30/17 She has not been exercising, but has home gym equipment and plans "on getting moving again" She continues to abstain from tobacco and rarely consumes ETOH 04/01/2019 OV: Marissa Duarte is here for CPE She has increased regular exercise- walking and stationary bike- combined 4 times per week. She est 03/04/2019 Labs: TSH-WNL, 1.950  The 10-year ASCVD risk score Marissa Duarte., et al., 2013) is: 4.5%  Values used to calculate the score:   Age: 58 years   Sex: Female   Is Non-Hispanic African American: No   Diabetic: No   Tobacco smoker: No   Systolic Blood Pressure: 782 mmHg   Is BP treated: Yes   HDL Cholesterol: 51 mg/dL   Total Cholesterol: 216 mg/dL  LDL-140  A1c-5.2  CMP-stable  CBC-stable   Healthcare Maintenance: PAP-UTD, 08/06/2017-normal Mammogram-UTD, 12/10/2018-normal Colonoscopy-UTD, 09/28/2016-repeat 10 years Immunizations-  Patient Care Team    Relationship Specialty Notifications Start End  Esaw Grandchild, NP PCP - General Family Medicine  08/23/16   Mammography, [facility]  Diagnostic Radiology  10/30/17     Patient Active Problem List   Diagnosis Date Noted  . Elevated LDL cholesterol level 04/01/2019  . 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 Healthcare maintenance 10/30/2017  . Anemia 10/30/2017  . Fatigue 08/23/2016  . Obesity (BMI 35.0-39.9 without comorbidity) 08/23/2016  . Essential hypertension, benign 03/05/2014     Past Medical History:  Diagnosis Date  . Acid reflux   . Esophagitis 3/11    noted on EGD  . Hypertension   . Kidney stone      Past Surgical  History:  Procedure Laterality Date  . BREAST LUMPECTOMY  age 51   fibroadenoma  . COLONOSCOPY    . TUBAL LIGATION  1990  . UPPER GASTROINTESTINAL ENDOSCOPY    . WISDOM TOOTH EXTRACTION       Family History  Problem Relation Age of Onset  . Hypertension Father   . Polycystic kidney disease Father   . Cancer Father        lung  . Hypertension Mother   . Infertility Sister   . Hypertension Sister   . Polycystic kidney disease Sister   . Healthy Brother   . Heart attack Maternal Uncle   . Diabetes Maternal Grandfather   . Colon cancer Neg Hx   . Esophageal cancer Neg Hx   . Rectal cancer Neg Hx   . Stomach cancer Neg Hx      Social History   Substance and Sexual Activity  Drug Use No     Social History   Substance and Sexual Activity  Alcohol Use Yes  . Alcohol/week: 6.0 standard drinks  . Types: 6 Glasses of wine per week     Social History   Tobacco Use  Smoking Status Former Smoker  . Packs/day: 0.50  . Years: 20.00  . Pack years: 10.00  . Types: Cigarettes  . Quit date: 06/19/1996  . Years since quitting: 22.7  Smokeless Tobacco Never Used     Outpatient Encounter Medications as of 04/01/2019  Medication Sig  . Ascorbic Acid (VITAMIN C) 1000 MG tablet Take 1,000 mg  by mouth daily.  . Esomeprazole Magnesium (NEXIUM PO) Take by mouth. OTC- every other day  . Omega-3 Fatty Acids (FISH OIL PO) Take by mouth daily.  . valsartan-hydrochlorothiazide (DIOVAN-HCT) 160-25 MG tablet TAKE 0.5 TABLETS BY MOUTH DAILY. OFFICE VISIT REQUIRED PRIOR TO ANY FURTHER REFILLS.   No facility-administered encounter medications on file as of 04/01/2019.     Allergies: Patient has no known allergies.  Body mass index is 34.33 kg/m.  Blood pressure (!) 135/92, pulse 87, temperature 98.2 F (36.8 C), temperature source Oral, height 5' 3.5" (1.613 m), weight 196 lb 14.4 oz (89.3 kg), last menstrual period 06/19/2012, SpO2 97 %. Review of Systems  Constitutional:  Positive for fatigue. Negative for activity change, appetite change, chills, diaphoresis, fever and unexpected weight change.  HENT: Negative for congestion.   Eyes: Negative for visual disturbance.  Respiratory: Negative for cough, chest tightness, shortness of breath, wheezing and stridor.   Cardiovascular: Negative for chest pain, palpitations and leg swelling.  Gastrointestinal: Negative for abdominal distention, abdominal pain, blood in stool, constipation, diarrhea, nausea and vomiting.  Endocrine: Negative for cold intolerance, heat intolerance, polydipsia, polyphagia and polyuria.  Genitourinary: Negative for difficulty urinating and flank pain.  Musculoskeletal: Negative for arthralgias, back pain, gait problem, joint swelling, myalgias, neck pain and neck stiffness.  Skin: Negative for color change, pallor, rash and wound.  Neurological: Negative for dizziness and headaches.  Hematological: Negative for adenopathy. Does not bruise/bleed easily.  Psychiatric/Behavioral: Negative for agitation, behavioral problems, confusion, decreased concentration, dysphoric mood, hallucinations, self-injury, sleep disturbance and suicidal ideas. The patient is not nervous/anxious and is not hyperactive.        Objective:   Physical Exam Vitals signs and nursing note reviewed.  Constitutional:      General: She is not in acute distress.    Appearance: Normal appearance. She is obese. She is not ill-appearing, toxic-appearing or diaphoretic.  HENT:     Head: Normocephalic and atraumatic.     Right Ear: Tympanic membrane, ear canal and external ear normal. There is no impacted cerumen.     Left Ear: Tympanic membrane, ear canal and external ear normal.     Nose: Nose normal. No congestion or rhinorrhea.     Mouth/Throat:     Mouth: Mucous membranes are moist.     Pharynx: No oropharyngeal exudate or posterior oropharyngeal erythema.  Eyes:     Extraocular Movements: Extraocular movements  intact.     Conjunctiva/sclera: Conjunctivae normal.     Pupils: Pupils are equal, round, and reactive to light.  Neck:     Musculoskeletal: Normal range of motion and neck supple.  Cardiovascular:     Rate and Rhythm: Normal rate and regular rhythm.     Pulses: Normal pulses.     Heart sounds: Normal heart sounds. No murmur. No friction rub. No gallop.   Pulmonary:     Effort: Pulmonary effort is normal. No respiratory distress.     Breath sounds: Normal breath sounds. No stridor. No wheezing, rhonchi or rales.  Chest:     Chest wall: No tenderness.  Abdominal:     General: Abdomen is protuberant. Bowel sounds are normal.     Palpations: Abdomen is soft.     Tenderness: There is no abdominal tenderness. There is no right CVA tenderness, left CVA tenderness, guarding or rebound.  Musculoskeletal: Normal range of motion.  Skin:    General: Skin is warm and dry.     Capillary Refill: Capillary refill takes less  than 2 seconds.  Neurological:     Mental Status: She is alert and oriented to person, place, and time.     Coordination: Coordination normal.  Psychiatric:        Mood and Affect: Mood normal.        Behavior: Behavior normal.        Thought Content: Thought content normal.        Judgment: Judgment normal.       Assessment & Plan:   1. Need for influenza vaccination   2. Elevated LDL cholesterol level   3. Healthcare maintenance   4. Anemia, unspecified type   5. Essential hypertension, benign     Healthcare maintenance Continue all medications as directed and remain well hydrated. Due to elevations on total and LDL (bad) cholesterol- recommend you reduce saturated fat and continue regular exercise (walking, stationary bike). Please schedule office visit in 6 months and come fasting so that we can re-check your cholesterol levels. Continue to social distance and wear a mask when in public.  Elevated LDL cholesterol level The 41-LKGM ASCVD risk score Denman George DC  Duarte., et al., 2013) is: 4.5%  Values used to calculate the score:   Age: 5 years   Sex: Female   Is Non-Hispanic African American: No   Diabetic: No   Tobacco smoker: No   Systolic Blood Pressure: 122 mmHg   Is BP treated: Yes   HDL Cholesterol: 51 mg/dL   Total Cholesterol: 010 mg/dL  [id number]   Anemia 6/44/0347 CBC- H/H 14.2/41.5  Essential hypertension, benign Valsartan/HCTZ 160/25mg  QD    FOLLOW-UP:  Return in about 6 months (around 09/30/2019) for HTN, Hypercholestermia, Fasting Labs.

## 2019-04-01 NOTE — Assessment & Plan Note (Signed)
Continue all medications as directed and remain well hydrated. Due to elevations on total and LDL (bad) cholesterol- recommend you reduce saturated fat and continue regular exercise (walking, stationary bike). Please schedule office visit in 6 months and come fasting so that we can re-check your cholesterol levels. Continue to social distance and wear a mask when in public.

## 2019-04-01 NOTE — Assessment & Plan Note (Signed)
03/04/2019 CBC- H/H 14.2/41.5

## 2019-04-01 NOTE — Assessment & Plan Note (Signed)
The 10-year ASCVD risk score [name] DC Brooke Bonito., et al., 2013) is: 4.5%  Values used to calculate the score:   Age: 58 years   Sex: Female   Is Non-Hispanic African American: No   Diabetic: No   Tobacco smoker: No   Systolic Blood Pressure: 510 mmHg   Is BP treated: Yes   HDL Cholesterol: 51 mg/dL   Total Cholesterol: 216 mg/dL  LDL-140

## 2019-04-01 NOTE — Patient Instructions (Signed)
Preventive Care for Adults, Female  A healthy lifestyle and preventive care can promote health and wellness. Preventive health guidelines for women include the following key practices.   A routine yearly physical is a good way to check with your health care provider about your health and preventive screening. It is a chance to share any concerns and updates on your health and to receive a thorough exam.   Visit your dentist for a routine exam and preventive care every 6 months. Brush your teeth twice a day and floss once a day. Good oral hygiene prevents tooth decay and gum disease.   The frequency of eye exams is based on your age, health, family medical history, use of contact lenses, and other factors. Follow your health care provider's recommendations for frequency of eye exams.   Eat a healthy diet. Foods like vegetables, fruits, whole grains, low-fat dairy products, and lean protein foods contain the nutrients you need without too many calories. Decrease your intake of foods high in solid fats, added sugars, and salt. Eat the right amount of calories for you.Get information about a proper diet from your health care provider, if necessary.   Regular physical exercise is one of the most important things you can do for your health. Most adults should get at least 150 minutes of moderate-intensity exercise (any activity that increases your heart rate and causes you to sweat) each week. In addition, most adults need muscle-strengthening exercises on 2 or more days a week.   Maintain a healthy weight. The body mass index (BMI) is a screening tool to identify possible weight problems. It provides an estimate of body fat based on height and weight. Your health care provider can find your BMI, and can help you achieve or maintain a healthy weight.For adults 20 years and older:   - A BMI below 18.5 is considered underweight.   - A BMI of 18.5 to 24.9 is normal.   - A BMI of 25 to 29.9 is  considered overweight.   - A BMI of 30 and above is considered obese.   Maintain normal blood lipids and cholesterol levels by exercising and minimizing your intake of trans and saturated fats.  Eat a balanced diet with plenty of fruit and vegetables. Blood tests for lipids and cholesterol should begin at age 31 and be repeated every 5 years minimum.  If your lipid or cholesterol levels are high, you are over 40, or you are at high risk for heart disease, you may need your cholesterol levels checked more frequently.Ongoing high lipid and cholesterol levels should be treated with medicines if diet and exercise are not working.   If you smoke, find out from your health care provider how to quit. If you do not use tobacco, do not start.   Lung cancer screening is recommended for adults aged 67-80 years who are at high risk for developing lung cancer because of a history of smoking. A yearly low-dose CT scan of the lungs is recommended for people who have at least a 30-pack-year history of smoking and are a current smoker or have quit within the past 15 years. A pack year of smoking is smoking an average of 1 pack of cigarettes a day for 1 year (for example: 1 pack a day for 30 years or 2 packs a day for 15 years). Yearly screening should continue until the smoker has stopped smoking for at least 15 years. Yearly screening should be stopped for people who develop a  health problem that would prevent them from having lung cancer treatment.   If you are pregnant, do not drink alcohol. If you are breastfeeding, be very cautious about drinking alcohol. If you are not pregnant and choose to drink alcohol, do not have more than 1 drink per day. One drink is considered to be 12 ounces (355 mL) of beer, 5 ounces (148 mL) of wine, or 1.5 ounces (44 mL) of liquor.   Avoid use of street drugs. Do not share needles with anyone. Ask for help if you need support or instructions about stopping the use of  drugs.   High blood pressure causes heart disease and increases the risk of stroke. Your blood pressure should be checked at least yearly.  Ongoing high blood pressure should be treated with medicines if weight loss and exercise do not work.   If you are 56-67 years old, ask your health care provider if you should take aspirin to prevent strokes.   Diabetes screening involves taking a blood sample to check your fasting blood sugar level. This should be done once every 3 years, after age 39, if you are within normal weight and without risk factors for diabetes. Testing should be considered at a younger age or be carried out more frequently if you are overweight and have at least 1 risk factor for diabetes.   Breast cancer screening is essential preventive care for women. You should practice "breast self-awareness."  This means understanding the normal appearance and feel of your breasts and may include breast self-examination.  Any changes detected, no matter how small, should be reported to a health care provider.  Women in their 60s and 30s should have a clinical breast exam (CBE) by a health care provider as part of a regular health exam every 1 to 3 years.  After age 70, women should have a CBE every year.  Starting at age 56, women should consider having a mammogram (breast X-ray test) every year.  Women who have a family history of breast cancer should talk to their health care provider about genetic screening.  Women at a high risk of breast cancer should talk to their health care providers about having an MRI and a mammogram every year.   -Breast cancer gene (BRCA)-related cancer risk assessment is recommended for women who have family members with BRCA-related cancers. BRCA-related cancers include breast, ovarian, tubal, and peritoneal cancers. Having family members with these cancers may be associated with an increased risk for harmful changes (mutations) in the breast cancer genes BRCA1 and  BRCA2. Results of the assessment will determine the need for genetic counseling and BRCA1 and BRCA2 testing.   The Pap test is a screening test for cervical cancer. A Pap test can show cell changes on the cervix that might become cervical cancer if left untreated. A Pap test is a procedure in which cells are obtained and examined from the lower end of the uterus (cervix).   - Women should have a Pap test starting at age 57.   - Between ages 21 and 79, Pap tests should be repeated every 2 years.   - Beginning at age 74, you should have a Pap test every 3 years as long as the past 3 Pap tests have been normal.   - Some women have medical problems that increase the chance of getting cervical cancer. Talk to your health care provider about these problems. It is especially important to talk to your health care provider if a  new problem develops soon after your last Pap test. In these cases, your health care provider may recommend more frequent screening and Pap tests.   - The above recommendations are the same for women who have or have not gotten the vaccine for human papillomavirus (HPV).   - If you had a hysterectomy for a problem that was not cancer or a condition that could lead to cancer, then you no longer need Pap tests. Even if you no longer need a Pap test, a regular exam is a good idea to make sure no other problems are starting.   - If you are between ages 28 and 60 years, and you have had normal Pap tests going back 10 years, you no longer need Pap tests. Even if you no longer need a Pap test, a regular exam is a good idea to make sure no other problems are starting.   - If you have had past treatment for cervical cancer or a condition that could lead to cancer, you need Pap tests and screening for cancer for at least 20 years after your treatment.   - If Pap tests have been discontinued, risk factors (such as a new sexual partner) need to be reassessed to determine if screening should  be resumed.   - The HPV test is an additional test that may be used for cervical cancer screening. The HPV test looks for the virus that can cause the cell changes on the cervix. The cells collected during the Pap test can be tested for HPV. The HPV test could be used to screen women aged 42 years and older, and should be used in women of any age who have unclear Pap test results. After the age of 71, women should have HPV testing at the same frequency as a Pap test.   Colorectal cancer can be detected and often prevented. Most routine colorectal cancer screening begins at the age of 75 years and continues through age 98 years. However, your health care provider may recommend screening at an earlier age if you have risk factors for colon cancer. On a yearly basis, your health care provider may provide home test kits to check for hidden blood in the stool.  Use of a small camera at the end of a tube, to directly examine the colon (sigmoidoscopy or colonoscopy), can detect the earliest forms of colorectal cancer. Talk to your health care provider about this at age 51, when routine screening begins. Direct exam of the colon should be repeated every 5 -10 years through age 21 years, unless early forms of pre-cancerous polyps or small growths are found.   People who are at an increased risk for hepatitis B should be screened for this virus. You are considered at high risk for hepatitis B if:  -You were born in a country where hepatitis B occurs often. Talk with your health care provider about which countries are considered high risk.  - Your parents were born in a high-risk country and you have not received a shot to protect against hepatitis B (hepatitis B vaccine).  - You have HIV or AIDS.  - You use needles to inject street drugs.  - You live with, or have sex with, someone who has Hepatitis B.  - You get hemodialysis treatment.  - You take certain medicines for conditions like cancer, organ  transplantation, and autoimmune conditions.   Hepatitis C blood testing is recommended for all people born from [birth year] through 1965 and any individual  with known risks for hepatitis C.   Practice safe sex. Use condoms and avoid high-risk sexual practices to reduce the spread of sexually transmitted infections (STIs). STIs include gonorrhea, chlamydia, syphilis, trichomonas, herpes, HPV, and human immunodeficiency virus (HIV). Herpes, HIV, and HPV are viral illnesses that have no cure. They can result in disability, cancer, and death. Sexually active women aged 25 years and younger should be checked for chlamydia. Older women with new or multiple partners should also be tested for chlamydia. Testing for other STIs is recommended if you are sexually active and at increased risk.   Osteoporosis is a disease in which the bones lose minerals and strength with aging. This can result in serious bone fractures or breaks. The risk of osteoporosis can be identified using a bone density scan. Women ages 65 years and over and women at risk for fractures or osteoporosis should discuss screening with their health care providers. Ask your health care provider whether you should take a calcium supplement or vitamin D to There are also several preventive steps women can take to avoid osteoporosis and resulting fractures or to keep osteoporosis from worsening. -->Recommendations include:  Eat a balanced diet high in fruits, vegetables, calcium, and vitamins.  Get enough calcium. The recommended total intake of is 1,200 mg daily; for best absorption, if taking supplements, divide doses into 250-500 mg doses throughout the day. Of the two types of calcium, calcium carbonate is best absorbed when taken with food but calcium citrate can be taken on an empty stomach.  Get enough vitamin D. NAMS and the National Osteoporosis Foundation recommend at least 1,000 IU per day for women age 50 and over who are at risk of vitamin D  deficiency. Vitamin D deficiency can be caused by inadequate sun exposure (for example, those who live in northern latitudes).  Avoid alcohol and smoking. Heavy alcohol intake (more than 7 drinks per week) increases the risk of falls and hip fracture and women smokers tend to lose bone more rapidly and have lower bone mass than nonsmokers. Stopping smoking is one of the most important changes women can make to improve their health and decrease risk for disease.  Be physically active every day. Weight-bearing exercise (for example, fast walking, hiking, jogging, and weight training) may strengthen bones or slow the rate of bone loss that comes with aging. Balancing and muscle-strengthening exercises can reduce the risk of falling and fracture.  Consider therapeutic medications. Currently, several types of effective drugs are available. Healthcare providers can recommend the type most appropriate for each woman.  Eliminate environmental factors that may contribute to accidents. Falls cause nearly 90% of all osteoporotic fractures, so reducing this risk is an important bone-health strategy. Measures include ample lighting, removing obstructions to walking, using nonskid rugs on floors, and placing mats and/or grab bars in showers.  Be aware of medication side effects. Some common medicines make bones weaker. These include a type of steroid drug called glucocorticoids used for arthritis and asthma, some antiseizure drugs, certain sleeping pills, treatments for endometriosis, and some cancer drugs. An overactive thyroid gland or using too much thyroid hormone for an underactive thyroid can also be a problem. If you are taking these medicines, talk to your doctor about what you can do to help protect your bones.reduce the rate of osteoporosis.    Menopause can be associated with physical symptoms and risks. Hormone replacement therapy is available to decrease symptoms and risks. You should talk to your  health care provider   about whether hormone replacement therapy is right for you.   Use sunscreen. Apply sunscreen liberally and repeatedly throughout the day. You should seek shade when your shadow is shorter than you. Protect yourself by wearing long sleeves, pants, a wide-brimmed hat, and sunglasses year round, whenever you are outdoors.   Once a month, do a whole body skin exam, using a mirror to look at the skin on your back. Tell your health care provider of new moles, moles that have irregular borders, moles that are larger than a pencil eraser, or moles that have changed in shape or color.   -Stay current with required vaccines (immunizations).   Influenza vaccine. All adults should be immunized every year.  Tetanus, diphtheria, and acellular pertussis (Td, Tdap) vaccine. Pregnant women should receive 1 dose of Tdap vaccine during each pregnancy. The dose should be obtained regardless of the length of time since the last dose. Immunization is preferred during the 27th 36th week of gestation. An adult who has not previously received Tdap or who does not know her vaccine status should receive 1 dose of Tdap. This initial dose should be followed by tetanus and diphtheria toxoids (Td) booster doses every 10 years. Adults with an unknown or incomplete history of completing a 3-dose immunization series with Td-containing vaccines should begin or complete a primary immunization series including a Tdap dose. Adults should receive a Td booster every 10 years.  Varicella vaccine. An adult without evidence of immunity to varicella should receive 2 doses or a second dose if she has previously received 1 dose. Pregnant females who do not have evidence of immunity should receive the first dose after pregnancy. This first dose should be obtained before leaving the health care facility. The second dose should be obtained 4 8 weeks after the first dose.  Human papillomavirus (HPV) vaccine. Females aged 14 26  years who have not received the vaccine previously should obtain the 3-dose series. The vaccine is not recommended for use in pregnant females. However, pregnancy testing is not needed before receiving a dose. If a female is found to be pregnant after receiving a dose, no treatment is needed. In that case, the remaining doses should be delayed until after the pregnancy. Immunization is recommended for any person with an immunocompromised condition through the age of 65 years if she did not get any or all doses earlier. During the 3-dose series, the second dose should be obtained 4 8 weeks after the first dose. The third dose should be obtained 24 weeks after the first dose and 16 weeks after the second dose.  Zoster vaccine. One dose is recommended for adults aged 79 years or older unless certain conditions are present.  Measles, mumps, and rubella (MMR) vaccine. Adults born before [birth year] generally are considered immune to measles and mumps. Adults born in [birth year] or later should have 1 or more doses of MMR vaccine unless there is a contraindication to the vaccine or there is laboratory evidence of immunity to each of the three diseases. A routine second dose of MMR vaccine should be obtained at least 28 days after the first dose for students attending postsecondary schools, health care workers, or international travelers. People who received inactivated measles vaccine or an unknown type of measles vaccine during 1963 1967 should receive 2 doses of MMR vaccine. People who received inactivated mumps vaccine or an unknown type of mumps vaccine before 1979 and are at high risk for mumps infection should consider immunization with 2 doses of  MMR vaccine. For females of childbearing age, rubella immunity should be determined. If there is no evidence of immunity, females who are not pregnant should be vaccinated. If there is no evidence of immunity, females who are pregnant should delay immunization until after pregnancy.  Unvaccinated health care workers born before [birth year] who lack laboratory evidence of measles, mumps, or rubella immunity or laboratory confirmation of disease should consider measles and mumps immunization with 2 doses of MMR vaccine or rubella immunization with 1 dose of MMR vaccine.  Pneumococcal 13-valent conjugate (PCV13) vaccine. When indicated, a person who is uncertain of her immunization history and has no record of immunization should receive the PCV13 vaccine. An adult aged 54 years or older who has certain medical conditions and has not been previously immunized should receive 1 dose of PCV13 vaccine. This PCV13 should be followed with a dose of pneumococcal polysaccharide (PPSV23) vaccine. The PPSV23 vaccine dose should be obtained at least 8 weeks after the dose of PCV13 vaccine. An adult aged 58 years or older who has certain medical conditions and previously received 1 or more doses of PPSV23 vaccine should receive 1 dose of PCV13. The PCV13 vaccine dose should be obtained 1 or more years after the last PPSV23 vaccine dose.  Pneumococcal polysaccharide (PPSV23) vaccine. When PCV13 is also indicated, PCV13 should be obtained first. All adults aged 58 years and older should be immunized. An adult younger than age 65 years who has certain medical conditions should be immunized. Any person who resides in a nursing home or long-term care facility should be immunized. An adult smoker should be immunized. People with an immunocompromised condition and certain other conditions should receive both PCV13 and PPSV23 vaccines. People with human immunodeficiency virus (HIV) infection should be immunized as soon as possible after diagnosis. Immunization during chemotherapy or radiation therapy should be avoided. Routine use of PPSV23 vaccine is not recommended for American Indians, [place] Natives, or people younger than 65 years unless there are medical conditions that require PPSV23 vaccine. When indicated,  people who have unknown immunization and have no record of immunization should receive PPSV23 vaccine. One-time revaccination 5 years after the first dose of PPSV23 is recommended for people aged 70 64 years who have chronic kidney failure, nephrotic syndrome, asplenia, or immunocompromised conditions. People who received 1 2 doses of PPSV23 before age 32 years should receive another dose of PPSV23 vaccine at age 96 years or later if at least 5 years have passed since the previous dose. Doses of PPSV23 are not needed for people immunized with PPSV23 at or after age 55 years.  Meningococcal vaccine. Adults with asplenia or persistent complement component deficiencies should receive 2 doses of quadrivalent meningococcal conjugate (MenACWY-D) vaccine. The doses should be obtained at least 2 months apart. Microbiologists working with certain meningococcal bacteria, [employer] recruits, people at risk during an outbreak, and people who travel to or live in countries with a high rate of meningitis should be immunized. A first-year college student up through age 58 years who is living in a residence hall should receive a dose if she did not receive a dose on or after her 16th birthday. Adults who have certain high-risk conditions should receive one or more doses of vaccine.  Hepatitis A vaccine. Adults who wish to be protected from this disease, have certain high-risk conditions, work with hepatitis A-infected animals, work in hepatitis A research labs, or travel to or work in countries with a high rate of hepatitis A should be  immunized. Adults who were previously unvaccinated and who anticipate close contact with an international adoptee during the first 60 days after arrival in the [place] from a country with a high rate of hepatitis A should be immunized.  Hepatitis B vaccine.  Adults who wish to be protected from this disease, have certain high-risk conditions, may be exposed to blood or other infectious  body fluids, are household contacts or sex partners of hepatitis B positive people, are clients or workers in certain care facilities, or travel to or work in countries with a high rate of hepatitis B should be immunized.  Haemophilus influenzae type b (Hib) vaccine. A previously unvaccinated person with asplenia or sickle cell disease or having a scheduled splenectomy should receive 1 dose of Hib vaccine. Regardless of previous immunization, a recipient of a hematopoietic stem cell transplant should receive a 3-dose series 6 12 months after her successful transplant. Hib vaccine is not recommended for adults with HIV infection.  Preventive Services / Frequency Ages 41 to 39years  Blood pressure check.** / Every 1 to 2 years.  Lipid and cholesterol check.** / Every 5 years beginning at age 81.  Clinical breast exam.** / Every 3 years for women in their 63s and 3s.  BRCA-related cancer risk assessment.** / For women who have family members with a BRCA-related cancer (breast, ovarian, tubal, or peritoneal cancers).  Pap test.** / Every 2 years from ages 22 through 53. Every 3 years starting at age 59 through age 26 or 56 with a history of 3 consecutive normal Pap tests.  HPV screening.** / Every 3 years from ages 72 through ages 55 to 73 with a history of 3 consecutive normal Pap tests.  Hepatitis C blood test.** / For any individual with known risks for hepatitis C.  Skin self-exam. / Monthly.  Influenza vaccine. / Every year.  Tetanus, diphtheria, and acellular pertussis (Tdap, Td) vaccine.** / Consult your health care provider. Pregnant women should receive 1 dose of Tdap vaccine during each pregnancy. 1 dose of Td every 10 years.  Varicella vaccine.** / Consult your health care provider. Pregnant females who do not have evidence of immunity should receive the first dose after pregnancy.  HPV vaccine. / 3 doses over 6 months, if 6 and younger. The vaccine is not recommended for use in  pregnant females. However, pregnancy testing is not needed before receiving a dose.  Measles, mumps, rubella (MMR) vaccine.** / You need at least 1 dose of MMR if you were born in 1957 or later. You may also need a 2nd dose. For females of childbearing age, rubella immunity should be determined. If there is no evidence of immunity, females who are not pregnant should be vaccinated. If there is no evidence of immunity, females who are pregnant should delay immunization until after pregnancy.  Pneumococcal 13-valent conjugate (PCV13) vaccine.** / Consult your health care provider.  Pneumococcal polysaccharide (PPSV23) vaccine.** / 1 to 2 doses if you smoke cigarettes or if you have certain conditions.  Meningococcal vaccine.** / 1 dose if you are age 88 to 62 years and a Market researcher living in a residence hall, or have one of several medical conditions, you need to get vaccinated against meningococcal disease. You may also need additional booster doses.  Hepatitis A vaccine.** / Consult your health care provider.  Hepatitis B vaccine.** / Consult your health care provider.  Haemophilus influenzae type b (Hib) vaccine.** / Consult your health care provider.  Ages 49 to 19years  Blood pressure check.** / Every 1 to 2 years.  Lipid and cholesterol check.** / Every 5 years beginning at age 20 years.  Lung cancer screening. / Every year if you are aged 55 80 years and have a 30-pack-year history of smoking and currently smoke or have quit within the past 15 years. Yearly screening is stopped once you have quit smoking for at least 15 years or develop a health problem that would prevent you from having lung cancer treatment.  Clinical breast exam.** / Every year after age 40 years.  BRCA-related cancer risk assessment.** / For women who have family members with a BRCA-related cancer (breast, ovarian, tubal, or peritoneal cancers).  Mammogram.** / Every year beginning at age 40  years and continuing for as long as you are in good health. Consult with your health care provider.  Pap test.** / Every 3 years starting at age 30 years through age 65 or 70 years with a history of 3 consecutive normal Pap tests.  HPV screening.** / Every 3 years from ages 30 years through ages 65 to 70 years with a history of 3 consecutive normal Pap tests.  Fecal occult blood test (FOBT) of stool. / Every year beginning at age 50 years and continuing until age 75 years. You may not need to do this test if you get a colonoscopy every 10 years.  Flexible sigmoidoscopy or colonoscopy.** / Every 5 years for a flexible sigmoidoscopy or every 10 years for a colonoscopy beginning at age 50 years and continuing until age 75 years.  Hepatitis C blood test.** / For all people born from 1945 through 1965 and any individual with known risks for hepatitis C.  Skin self-exam. / Monthly.  Influenza vaccine. / Every year.  Tetanus, diphtheria, and acellular pertussis (Tdap/Td) vaccine.** / Consult your health care provider. Pregnant women should receive 1 dose of Tdap vaccine during each pregnancy. 1 dose of Td every 10 years.  Varicella vaccine.** / Consult your health care provider. Pregnant females who do not have evidence of immunity should receive the first dose after pregnancy.  Zoster vaccine.** / 1 dose for adults aged 60 years or older.  Measles, mumps, rubella (MMR) vaccine.** / You need at least 1 dose of MMR if you were born in 1957 or later. You may also need a 2nd dose. For females of childbearing age, rubella immunity should be determined. If there is no evidence of immunity, females who are not pregnant should be vaccinated. If there is no evidence of immunity, females who are pregnant should delay immunization until after pregnancy.  Pneumococcal 13-valent conjugate (PCV13) vaccine.** / Consult your health care provider.  Pneumococcal polysaccharide (PPSV23) vaccine.** / 1 to 2 doses if  you smoke cigarettes or if you have certain conditions.  Meningococcal vaccine.** / Consult your health care provider.  Hepatitis A vaccine.** / Consult your health care provider.  Hepatitis B vaccine.** / Consult your health care provider.  Haemophilus influenzae type b (Hib) vaccine.** / Consult your health care provider.  Ages 65 years and over  Blood pressure check.** / Every 1 to 2 years.  Lipid and cholesterol check.** / Every 5 years beginning at age 20 years.  Lung cancer screening. / Every year if you are aged 55 80 years and have a 30-pack-year history of smoking and currently smoke or have quit within the past 15 years. Yearly screening is stopped once you have quit smoking for at least 15 years or develop a health problem that   would prevent you from having lung cancer treatment.  Clinical breast exam.** / Every year after age 103 years.  BRCA-related cancer risk assessment.** / For women who have family members with a BRCA-related cancer (breast, ovarian, tubal, or peritoneal cancers).  Mammogram.** / Every year beginning at age 36 years and continuing for as long as you are in good health. Consult with your health care provider.  Pap test.** / Every 3 years starting at age 5 years through age 85 or 10 years with 3 consecutive normal Pap tests. Testing can be stopped between 65 and 70 years with 3 consecutive normal Pap tests and no abnormal Pap or HPV tests in the past 10 years.  HPV screening.** / Every 3 years from ages 93 years through ages 70 or 45 years with a history of 3 consecutive normal Pap tests. Testing can be stopped between 65 and 70 years with 3 consecutive normal Pap tests and no abnormal Pap or HPV tests in the past 10 years.  Fecal occult blood test (FOBT) of stool. / Every year beginning at age 8 years and continuing until age 45 years. You may not need to do this test if you get a colonoscopy every 10 years.  Flexible sigmoidoscopy or colonoscopy.** /  Every 5 years for a flexible sigmoidoscopy or every 10 years for a colonoscopy beginning at age 69 years and continuing until age 68 years.  Hepatitis C blood test.** / For all people born from [birth year] through 1967 and any individual with known risks for hepatitis C.  Osteoporosis screening.** / A one-time screening for women ages 7 years and over and women at risk for fractures or osteoporosis.  Skin self-exam. / Monthly.  Influenza vaccine. / Every year.  Tetanus, diphtheria, and acellular pertussis (Tdap/Td) vaccine.** / 1 dose of Td every 10 years.  Varicella vaccine.** / Consult your health care provider.  Zoster vaccine.** / 1 dose for adults aged 5 years or older.  Pneumococcal 13-valent conjugate (PCV13) vaccine.** / Consult your health care provider.  Pneumococcal polysaccharide (PPSV23) vaccine.** / 1 dose for all adults aged 74 years and older.  Meningococcal vaccine.** / Consult your health care provider.  Hepatitis A vaccine.** / Consult your health care provider.  Hepatitis B vaccine.** / Consult your health care provider.  Haemophilus influenzae type b (Hib) vaccine.** / Consult your health care provider. ** Family history and personal history of risk and conditions may change your health care provider's recommendations. Document Released: 08/01/2001 Document Revised: 03/26/2013  [facility] Patient Information 2014 [place], [place].   EXERCISE AND DIET:  We recommended that you start or continue a regular exercise program for good health. Regular exercise means any activity that makes your heart beat faster and makes you sweat.  We recommend exercising at least 30 minutes per day at least 3 days a week, preferably 5.  We also recommend a diet low in fat and sugar / carbohydrates.  Inactivity, poor dietary choices and obesity can cause diabetes, heart attack, stroke, and kidney damage, among others.     ALCOHOL AND SMOKING:  Women should limit their alcohol intake to no  more than 7 drinks/beers/glasses of wine (combined, not each!) per week. Moderation of alcohol intake to this level decreases your risk of breast cancer and liver damage.  ( And of course, no recreational drugs are part of a healthy lifestyle.)  Also, you should not be smoking at all or even being exposed to second hand smoke. Most people know smoking can  cause cancer, and various heart and lung diseases, but did you know it also contributes to weakening of your bones?  Aging of your skin?  Yellowing of your teeth and nails?   CALCIUM AND VITAMIN D:  Adequate intake of calcium and Vitamin D are recommended.  The recommendations for exact amounts of these supplements seem to change often, but generally speaking 600 mg of calcium (either carbonate or citrate) and 800 units of Vitamin D per day seems prudent. Certain women may benefit from higher intake of Vitamin D.  If you are among these women, your doctor will have told you during your visit.     PAP SMEARS:  Pap smears, to check for cervical cancer or precancers,  have traditionally been done yearly, although recent scientific advances have shown that most women can have pap smears less often.  However, every woman still should have a physical exam from her gynecologist or primary care physician every year. It will include a breast check, inspection of the vulva and vagina to check for abnormal growths or skin changes, a visual exam of the cervix, and then an exam to evaluate the size and shape of the uterus and ovaries.  And after 60 years of age, a rectal exam is indicated to check for rectal cancers. We will also provide age appropriate advice regarding health maintenance, like when you should have certain vaccines, screening for sexually transmitted diseases, bone density testing, colonoscopy, mammograms, etc.    MAMMOGRAMS:  All women over 75 years old should have a yearly mammogram. Many facilities now offer a "3D" mammogram, which may cost  around $50 extra out of pocket. If possible,  we recommend you accept the option to have the 3D mammogram performed.  It both reduces the number of women who will be called back for extra views which then turn out to be normal, and it is better than the routine mammogram at detecting truly abnormal areas.     COLONOSCOPY:  Colonoscopy to screen for colon cancer is recommended for all women at age 69.  We know, you hate the idea of the prep.  We agree, BUT, having colon cancer and not knowing it is worse!!  Colon cancer so often starts as a polyp that can be seen and removed at colonscopy, which can quite literally save your life!  And if your first colonoscopy is normal and you have no family history of colon cancer, most women don't have to have it again for 10 years.  Once every ten years, you can do something that may end up saving your life, right?  We will be happy to help you get it scheduled when you are ready.  Be sure to check your insurance coverage so you understand how much it will cost.  It may be covered as a preventative service at no cost, but you should check your particular policy.    Continue all medications as directed and remain well hydrated. Due to elevations on total and LDL (bad) cholesterol- recommend you reduce saturated fat and continue regular exercise (walking, stationary bike). Please schedule office visit in 6 months and come fasting so that we can re-check your cholesterol levels. Continue to social distance and wear a mask when in public. GREAT TO SEE YOU!

## 2019-04-01 NOTE — Assessment & Plan Note (Signed)
Valsartan/HCTZ 160/25mg  QD

## 2019-04-11 ENCOUNTER — Other Ambulatory Visit: Payer: Self-pay | Admitting: Adult Health

## 2019-04-28 ENCOUNTER — Telehealth: Payer: Self-pay | Admitting: Adult Health

## 2019-04-28 NOTE — Telephone Encounter (Signed)
Pt's spouse, per DPR, was informed of appt time for 04/29/2019 at [place], CMA

## 2019-04-28 NOTE — Telephone Encounter (Signed)
Patient called states she thinks she is having symptoms of the Shingles---Wants provider's advice on what to do.  --Forwarding message to medical asst to contact her at [phone]

## 2019-04-28 NOTE — Progress Notes (Signed)
Subjective:    Patient ID: Marissa Duarte, female    DOB: 07/13/1960, 58 y.o.   MRN: [id number]  HPI:  Marissa Duarte presents with painful rash the erupted >72 hrs ago. She reports prodromal pain in her L thoracic back prior to rash. Rash also developed on L breast. She now reports intermittent pain that is described as "burning, tingling", rated 7/10. She denies itching at rash sites. She denies drainage, however reports "bumps just keep forming". She reports feeling hot and chills yesterday- unsure of what her oral temp was. She denies N/V/D/cough/dyspnea/chest pain/chest pressure/palpitations/HA. She reports poor appetite. She believes that she had varicella as a child. She has never had herpes zoster in past. She denies dramatic increase in stress levels or known exposure to Herpes Zoster.   Last CMP 03/04/2019 GFR 88 LFTs-WNL   Patient Care Team    Relationship Specialty Notifications Start End  Julaine Fusi, NP PCP - General Family Medicine  08/23/16   Mammography, [facility]  Diagnostic Radiology  10/30/17     Patient Active Problem List   Diagnosis Date Noted  . Herpes zoster 04/29/2019  . Elevated LDL cholesterol level 04/01/2019  . 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 Healthcare maintenance 10/30/2017  . Anemia 10/30/2017  . Fatigue 08/23/2016  . Obesity (BMI 35.0-39.9 without comorbidity) 08/23/2016  . Essential hypertension, benign 03/05/2014     Past Medical History:  Diagnosis Date  . Acid reflux   . Esophagitis 3/11    noted on EGD  . Hypertension   . Kidney stone      Past Surgical History:  Procedure Laterality Date  . BREAST LUMPECTOMY  age 69   fibroadenoma  . COLONOSCOPY    . TUBAL LIGATION  1990  . UPPER GASTROINTESTINAL ENDOSCOPY    . WISDOM TOOTH EXTRACTION       Family History  Problem Relation Age of Onset  . Hypertension Father   . Polycystic kidney disease Father   . Cancer Father        lung  . Hypertension Mother   . Infertility  Sister   . Hypertension Sister   . Polycystic kidney disease Sister   . Healthy Brother   . Heart attack Maternal Uncle   . Diabetes Maternal Grandfather   . Colon cancer Neg Hx   . Esophageal cancer Neg Hx   . Rectal cancer Neg Hx   . Stomach cancer Neg Hx      Social History   Substance and Sexual Activity  Drug Use No     Social History   Substance and Sexual Activity  Alcohol Use Yes  . Alcohol/week: 6.0 standard drinks  . Types: 6 Glasses of wine per week     Social History   Tobacco Use  Smoking Status Former Smoker  . Packs/day: 0.50  . Years: 20.00  . Pack years: 10.00  . Types: Cigarettes  . Quit date: 06/19/1996  . Years since quitting: 22.8  Smokeless Tobacco Never Used     Outpatient Encounter Medications as of 04/29/2019  Medication Sig  . Ascorbic Acid (VITAMIN C) 1000 MG tablet Take 1,000 mg by mouth daily.  . Esomeprazole Magnesium (NEXIUM PO) Take by mouth. OTC- every other day  . Omega-3 Fatty Acids (FISH OIL PO) Take by mouth daily.  . valsartan-hydrochlorothiazide (DIOVAN-HCT) 160-25 MG tablet Take 0.5 tablets by mouth daily.  Marland Kitchen gabapentin (NEURONTIN) 300 MG capsule 1 tab day one. 1 tab every 12 hrs day two as  needed. 1 tab every 8 hrs as needed day three-hold at this dose- as needed.  . valACYclovir (VALTREX) 1000 MG tablet Take 1 tablet (1,000 mg total) by mouth 3 (three) times daily.   No facility-administered encounter medications on file as of 04/29/2019.     Allergies: Patient has no known allergies.  Body mass index is 34.33 kg/m.  Blood pressure 133/73, pulse 96, temperature 98.7 F (37.1 C), temperature source Oral, height 5' 3.5" (1.613 m), weight 196 lb 14.4 oz (89.3 kg), last menstrual period 06/19/2012, SpO2 95 %.  Review of Systems  Constitutional: Positive for appetite change, fatigue and fever. Negative for activity change, chills, diaphoresis and unexpected weight change.  Eyes: Negative for visual disturbance.   Respiratory: Negative for cough, chest tightness, shortness of breath, wheezing and stridor.   Cardiovascular: Negative for chest pain, palpitations and leg swelling.  Gastrointestinal: Negative for abdominal distention, abdominal pain, blood in stool, constipation, diarrhea, nausea and vomiting.  Endocrine: Negative for polydipsia, polyphagia and polyuria.  Genitourinary: Negative for difficulty urinating and flank pain.  Skin: Positive for color change and rash. Negative for pallor and wound.  Neurological: Negative for dizziness and headaches.  Hematological: Negative for adenopathy. Does not bruise/bleed easily.       Objective:   Physical Exam Vitals signs and nursing note reviewed.  Constitutional:      General: She is not in acute distress.    Appearance: Normal appearance. She is not ill-appearing, toxic-appearing or diaphoretic.  Cardiovascular:     Rate and Rhythm: Normal rate and regular rhythm.     Pulses: Normal pulses.     Heart sounds: Normal heart sounds. No murmur. No friction rub. No gallop.   Pulmonary:     Effort: Pulmonary effort is normal. No respiratory distress.     Breath sounds: Normal breath sounds. No stridor. No wheezing, rhonchi or rales.  Chest:     Chest wall: No tenderness.  Skin:    General: Skin is warm.     Capillary Refill: Capillary refill takes less than 2 seconds.     Findings: Erythema and rash present. Rash is vesicular.     Comments: Dermatomal rash noted from L breast to middle of L thoracic back. Fluids filled vesicles noted, no open tissue/drainage noted. No streaking or excessive warmth noted.  Neurological:     Mental Status: She is alert and oriented to person, place, and time.  Psychiatric:        Mood and Affect: Mood normal.        Behavior: Behavior normal.        Thought Content: Thought content normal.        Judgment: Judgment normal.       Assessment & Plan:   1. Herpes zoster without complication     Herpes  zoster Please take Valacyclovir 1000mg  every 8 hrs, take for 7 days. Please use Gabapentin as needed per slow taper up- medication can be sedating. Please follow care instruction as detailed above. Follow-up in 2 weeks, sooner if needed.    FOLLOW-UP:  Return in about 2 weeks (around 05/13/2019) for Herpes Zoster.

## 2019-04-29 ENCOUNTER — Ambulatory Visit: Payer: 59 | Admitting: Adult Health

## 2019-04-29 ENCOUNTER — Other Ambulatory Visit: Payer: Self-pay

## 2019-04-29 ENCOUNTER — Encounter: Payer: Self-pay | Admitting: Adult Health

## 2019-04-29 DIAGNOSIS — B029 Zoster without complications: Secondary | ICD-10-CM | POA: Insufficient documentation

## 2019-04-29 MED ORDER — VALACYCLOVIR HCL 1 G PO TABS: 1000.0000 mg | ORAL_TABLET | Freq: Three times a day (TID) | ORAL | 0 refills | Status: DC

## 2019-04-29 MED ORDER — GABAPENTIN 300 MG PO CAPS: ORAL_CAPSULE | ORAL | 0 refills | Status: DC

## 2019-04-29 NOTE — Patient Instructions (Addendum)
Shingles  Shingles, which is also known as herpes zoster, is an infection that causes a painful skin rash and fluid-filled blisters. It is caused by a virus. Shingles only develops in people who:  Have had chickenpox.  Have been given a medicine to protect against chickenpox (have been vaccinated). Shingles is rare in this group. What are the causes? Shingles is caused by varicella-zoster virus (VZV). This is the same virus that causes chickenpox. After a person is exposed to VZV, the virus stays in the body in an inactive (dormant) state. Shingles develops if the virus is reactivated. This can happen many years after the first (initial) exposure to VZV. It is not known what causes this virus to be reactivated. What increases the risk? People who have had chickenpox or received the chickenpox vaccine are at risk for shingles. Shingles infection is more common in people who:  Are older than age 60.  Have a weakened disease-fighting system (immune system), such as people with: ? HIV. ? AIDS. ? Cancer.  Are taking medicines that weaken the immune system, such as transplant medicines.  Are experiencing a lot of stress. What are the signs or symptoms? Early symptoms of this condition include itching, tingling, and pain in an area on your skin. Pain may be described as burning, stabbing, or throbbing. A few days or weeks after early symptoms start, a painful red rash appears. The rash is usually on one side of the body and has a band-like or belt-like pattern. The rash eventually turns into fluid-filled blisters that break open, change into scabs, and dry up in about 2-3 weeks. At any time during the infection, you may also develop:  A fever.  Chills.  A headache.  An upset stomach. How is this diagnosed? This condition is diagnosed with a skin exam. Skin or fluid samples may be taken from the blisters before a diagnosis is made. These samples are examined under a microscope or sent to  a lab for testing. How is this treated? The rash may last for several weeks. There is not a specific cure for this condition. Your health care provider will probably prescribe medicines to help you manage pain, recover more quickly, and avoid long-term problems. Medicines may include:  Antiviral drugs.  Anti-inflammatory drugs.  Pain medicines.  Anti-itching medicines (antihistamines). If the area involved is on your face, you may be referred to a specialist, such as an eye doctor (ophthalmologist) or an ear, nose, and throat (ENT) doctor (otolaryngologist) to help you avoid eye problems, chronic pain, or disability. Follow these instructions at home: Medicines  Take over-the-counter and prescription medicines only as told by your health care provider.  Apply an anti-itch cream or numbing cream to the affected area as told by your health care provider. Relieving itching and discomfort   Apply cold, wet cloths (cold compresses) to the area of the rash or blisters as told by your health care provider.  Cool baths can be soothing. Try adding baking soda or dry oatmeal to the water to reduce itching. Do not bathe in hot water. Blister and rash care  Keep your rash covered with a loose bandage (dressing). Wear loose-fitting clothing to help ease the pain of material rubbing against the rash.  Keep your rash and blisters clean by washing the area with mild soap and cool water as told by your health care provider.  Check your rash every day for signs of infection. Check for: ? More redness, swelling, or pain. ? Fluid   or blood. ? Warmth. ? Pus or a bad smell.  Do not scratch your rash or pick at your blisters. To help avoid scratching: ? Keep your fingernails clean and cut short. ? Wear gloves or mittens while you sleep, if scratching is a problem. General instructions  Rest as told by your health care provider.  Keep all follow-up visits as told by your health care provider. This  is important.  Wash your hands often with soap and water. If soap and water are not available, use hand sanitizer. Doing this lowers your chance of getting a bacterial skin infection.  Before your blisters change into scabs, your shingles infection can cause chickenpox in people who have never had it or have never been vaccinated against it. To prevent this from happening, avoid contact with other people, especially: ? Babies. ? Pregnant women. ? Children who have eczema. ? Elderly people who have transplants. ? People who have chronic illnesses, such as cancer or AIDS. Contact a health care provider if:  Your pain is not relieved with prescribed medicines.  Your pain does not get better after the rash heals.  You have signs of infection in the rash area, such as: ? More redness, swelling, or pain around the rash. ? Fluid or blood coming from the rash. ? The rash area feeling warm to the touch. ? Pus or a bad smell coming from the rash. Get help right away if:  The rash is on your face or nose.  You have facial pain, pain around your eye area, or loss of feeling on one side of your face.  You have difficulty seeing.  You have ear pain or have ringing in your ear.  You have a loss of taste.  Your condition gets worse. Summary  Shingles, which is also known as herpes zoster, is an infection that causes a painful skin rash and fluid-filled blisters.  This condition is diagnosed with a skin exam. Skin or fluid samples may be taken from the blisters and examined before the diagnosis is made.  Keep your rash covered with a loose bandage (dressing). Wear loose-fitting clothing to help ease the pain of material rubbing against the rash.  Before your blisters change into scabs, your shingles infection can cause chickenpox in people who have never had it or have never been vaccinated against it. This information is not intended to replace advice given to you by your health care  provider. Make sure you discuss any questions you have with your health care provider. Document Released: 06/05/2005 Document Revised: 09/27/2018 Document Reviewed: 02/07/2017 Elsevier Patient Education  2020 [place].  Please take Valacyclovir 1000mg  every 8 hrs, take for 7 days. Please use Gabapentin as needed per slow taper up- medication can be sedating. Please follow care instruction as detailed above. Follow-up in 2 weeks, sooner if needed. FEEL BETTER!

## 2019-04-29 NOTE — Assessment & Plan Note (Signed)
Please take Valacyclovir 1000mg  every 8 hrs, take for 7 days. Please use Gabapentin as needed per slow taper up- medication can be sedating. Please follow care instruction as detailed above. Follow-up in 2 weeks, sooner if needed.

## 2019-05-12 NOTE — Progress Notes (Signed)
Virtual Visit via Telephone Note  I connected with Marissa Duarte on 05/13/2019 at 10:45 AM EST by telephone and verified that I am speaking with the correct person using two identifiers.  Location: Patient: Home Provider:In Clinic   I discussed the limitations, risks, security and privacy concerns of performing an evaluation and management service by telephone and the availability of in person appointments. I also discussed with the patient that there may be a patient responsible charge related to this service. The patient expressed understanding and agreed to proceed.   History of Present Illness: 04/29/2019 OV: Marissa Duarte presents with painful rash the erupted >72 hrs ago. She reports prodromal pain in her L thoracic back prior to rash. Rash also developed on L breast. She now reports intermittent pain that is described as "burning, tingling", rated 7/10. She denies itching at rash sites. She denies drainage, however reports "bumps just keep forming". She reports feeling hot and chills yesterday- unsure of what her oral temp was. She denies N/V/D/cough/dyspnea/chest pain/chest pressure/palpitations/HA. She reports poor appetite. She believes that she had varicella as a child. She has never had herpes zoster in past. She denies dramatic increase in stress levels or known exposure to Herpes Zoster.   Last CMP 03/04/2019 GFR 88 LFTs-WNL  05/13/2019 OV: Marissa Duarte calls in for f/u Herpes Zoster rash. She denies rash spreading, she estimates that 10% has completely resolved. She denies any rash on face. She denies open tissue/drainage. She denies any signs of infection. She denies N/V/D/C She denies fever/cough She completed course of Valtrex She has been using 1-2 gabapentin capsules/day, tappering off. She denies any overt pain, just "knows that it's there".    Patient Care Team    Relationship Specialty Notifications Start End  Julaine Fusi, NP PCP - General Family  Medicine  08/23/16   Mammography, [facility]  Diagnostic Radiology  10/30/17     Patient Active Problem List   Diagnosis Date Noted  . Herpes zoster 04/29/2019  . Elevated LDL cholesterol level 04/01/2019  . 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 Healthcare maintenance 10/30/2017  . Anemia 10/30/2017  . Fatigue 08/23/2016  . Obesity (BMI 35.0-39.9 without comorbidity) 08/23/2016  . Essential hypertension, benign 03/05/2014     Past Medical History:  Diagnosis Date  . Acid reflux   . Esophagitis 3/11    noted on EGD  . Hypertension   . Kidney stone      Past Surgical History:  Procedure Laterality Date  . BREAST LUMPECTOMY  age 66   fibroadenoma  . COLONOSCOPY    . TUBAL LIGATION  1990  . UPPER GASTROINTESTINAL ENDOSCOPY    . WISDOM TOOTH EXTRACTION       Family History  Problem Relation Age of Onset  . Hypertension Father   . Polycystic kidney disease Father   . Cancer Father        lung  . Hypertension Mother   . Infertility Sister   . Hypertension Sister   . Polycystic kidney disease Sister   . Healthy Brother   . Heart attack Maternal Uncle   . Diabetes Maternal Grandfather   . Colon cancer Neg Hx   . Esophageal cancer Neg Hx   . Rectal cancer Neg Hx   . Stomach cancer Neg Hx      Social History   Substance and Sexual Activity  Drug Use No     Social History   Substance and Sexual Activity  Alcohol Use Yes  . Alcohol/week:  6.0 standard drinks  . Types: 6 Glasses of wine per week     Social History   Tobacco Use  Smoking Status Former Smoker  . Packs/day: 0.50  . Years: 20.00  . Pack years: 10.00  . Types: Cigarettes  . Quit date: 06/19/1996  . Years since quitting: 22.9  Smokeless Tobacco Never Used     Outpatient Encounter Medications as of 05/13/2019  Medication Sig  . Ascorbic Acid (VITAMIN C) 1000 MG tablet Take 1,000 mg by mouth daily.  . Esomeprazole Magnesium (NEXIUM PO) Take by mouth. OTC- every other day  . gabapentin  (NEURONTIN) 300 MG capsule 1 tab day one. 1 tab every 12 hrs day two as needed. 1 tab every 8 hrs as needed day three-hold at this dose- as needed.  . Omega-3 Fatty Acids (FISH OIL PO) Take by mouth daily.  . valACYclovir (VALTREX) 1000 MG tablet Take 1 tablet (1,000 mg total) by mouth 3 (three) times daily.  . valsartan-hydrochlorothiazide (DIOVAN-HCT) 160-25 MG tablet Take 0.5 tablets by mouth daily.   No facility-administered encounter medications on file as of 05/13/2019.     Allergies: Patient has no known allergies.  There is no height or weight on file to calculate BMI.  Last menstrual period 06/19/2012. Review of Systems: General:   Denies fever, chills, unexplained weight loss.  Optho/Auditory:   Denies visual changes, blurred vision/LOV Respiratory:   Denies SOB, DOE more than baseline levels.  Cardiovascular:   Denies chest pain, palpitations, new onset peripheral edema  Gastrointestinal:   Denies nausea, vomiting, diarrhea.  Genitourinary: Denies dysuria, freq/ urgency, flank pain or discharge from genitals.  Endocrine:     Denies hot or cold intolerance, polyuria, polydipsia. Musculoskeletal:   Denies unexplained myalgias, joint swelling, unexplained arthralgias, gait problems.  Skin:  Denies rash, suspicious lesions, Rash + Neurological:     Denies dizziness, unexplained weakness, numbness  Psychiatric/Behavioral:   Denies mood changes, suicidal or homicidal ideations, hallucinations  Observations/Objective: No acute distress noted during the telephone conversation.  Assessment and Plan: Continue to taper off Gabapentin. Follow rash care instructions as provided at last OV If rash starts to spread again or if you notice and signs of infection-call clinic. Continue to social distance and wear a mask when in public.  Follow Up Instructions: PRN   I discussed the assessment and treatment plan with the patient. The patient was provided an opportunity to ask questions  and all were answered. The patient agreed with the plan and demonstrated an understanding of the instructions.   The patient was advised to call back or seek an in-person evaluation if the symptoms worsen or if the condition fails to improve as anticipated.  I provided 8 minutes of non-face-to-face time during this encounter.   Esaw Grandchild, NP

## 2019-05-13 ENCOUNTER — Encounter: Payer: Self-pay | Admitting: Adult Health

## 2019-05-13 ENCOUNTER — Other Ambulatory Visit: Payer: Self-pay

## 2019-05-13 ENCOUNTER — Ambulatory Visit (INDEPENDENT_AMBULATORY_CARE_PROVIDER_SITE_OTHER): Payer: 59 | Admitting: Adult Health

## 2019-05-13 DIAGNOSIS — B029 Zoster without complications: Secondary | ICD-10-CM

## 2019-05-13 NOTE — Assessment & Plan Note (Signed)
Assessment and Plan: She completed course of Valtrex Continue to taper off Gabapentin. Follow rash care instructions as provided at last OV If rash starts to spread again or if you notice and signs of infection-call clinic. Continue to social distance and wear a mask when in public.  Follow Up Instructions: PRN   I discussed the assessment and treatment plan with the patient. The patient was provided an opportunity to ask questions and all were answered. The patient agreed with the plan and demonstrated an understanding of the instructions.   The patient was advised to call back or seek an in-person evaluation if the symptoms worsen or if the condition fails to improve as anticipated.

## 2019-06-20 DIAGNOSIS — B029 Zoster without complications: Secondary | ICD-10-CM

## 2019-06-20 HISTORY — DX: Zoster without complications: B02.9

## 2019-09-18 ENCOUNTER — Other Ambulatory Visit: Payer: Self-pay | Admitting: Adult Health

## 2019-09-23 ENCOUNTER — Telehealth: Payer: Self-pay | Admitting: Adult Health

## 2019-09-23 MED ORDER — VALSARTAN-HYDROCHLOROTHIAZIDE 160-25 MG PO TABS: 0.5000 | ORAL_TABLET | Freq: Every day | ORAL | 0 refills | Status: DC

## 2019-09-23 NOTE — Addendum Note (Signed)
Addended by: Stan Head on: 09/23/2019 11:19 AM   Modules accepted: Orders

## 2019-09-23 NOTE — Telephone Encounter (Signed)
Patient is requesting a refill of her valsartan, if approved please send to [facility] on [address].

## 2019-09-23 NOTE — Telephone Encounter (Signed)
Please call pt to schedule appt.  No further refills until pt is seen.  T. Blaike Newburn, CMA  

## 2019-09-29 ENCOUNTER — Telehealth: Payer: Self-pay

## 2019-09-29 NOTE — Telephone Encounter (Signed)
Pt called stating that she has tested positive for COVID.  Pt c/o bad headaches, low grade fever in the evenings only and extreme fatigue.  Denies cough or SOB.  Pt states that she has been taking IBU 600mg  TID which is helping with the headaches.  Advised pt that she may take IBU up to 4 times daily (q6h) as needed, get lots of rest and push fluids.  Advised pt of red flag sxs and to seek immediate care at ED if she should develop any of the red flag symptoms.  Pt expressed understanding and is agreeable.  , CMA

## 2019-09-30 ENCOUNTER — Ambulatory Visit: Payer: 59 | Admitting: Physician Assistant

## 2019-10-15 ENCOUNTER — Other Ambulatory Visit: Payer: Self-pay | Admitting: Family Medicine

## 2019-10-15 ENCOUNTER — Other Ambulatory Visit: Payer: Self-pay | Admitting: Adult Health

## 2019-10-15 NOTE — Telephone Encounter (Signed)
Spoke with pt and advised her that she needs labs with f/u OV with Mayer Masker, PA prior to any further refills.  Per Kandis Cocking, ok to refill valsartan/hctz for 30 days.  Refill sent to pharmacy.  Pt transferred to front desk to schedule appts.  Tiajuana Amass, CMA

## 2019-10-22 ENCOUNTER — Other Ambulatory Visit: Payer: Self-pay | Admitting: Physician Assistant

## 2019-10-22 DIAGNOSIS — Z0000 Encounter for general adult medical examination without abnormal findings: Secondary | ICD-10-CM

## 2019-10-22 DIAGNOSIS — E7800 Pure hypercholesterolemia, unspecified: Secondary | ICD-10-CM

## 2019-10-22 DIAGNOSIS — I10 Essential (primary) hypertension: Secondary | ICD-10-CM

## 2019-10-22 DIAGNOSIS — D649 Anemia, unspecified: Secondary | ICD-10-CM

## 2019-10-24 ENCOUNTER — Other Ambulatory Visit: Payer: 59

## 2019-10-24 ENCOUNTER — Other Ambulatory Visit: Payer: Self-pay

## 2019-10-24 DIAGNOSIS — E7800 Pure hypercholesterolemia, unspecified: Secondary | ICD-10-CM

## 2019-10-24 DIAGNOSIS — Z0000 Encounter for general adult medical examination without abnormal findings: Secondary | ICD-10-CM

## 2019-10-24 DIAGNOSIS — D649 Anemia, unspecified: Secondary | ICD-10-CM

## 2019-10-24 DIAGNOSIS — I10 Essential (primary) hypertension: Secondary | ICD-10-CM

## 2019-10-25 LAB — COMPREHENSIVE METABOLIC PANEL
ALT: 19 IU/L (ref 0–32)
AST: 22 IU/L (ref 0–40)
Albumin/Globulin Ratio: 2.1 (ref 1.2–2.2)
Albumin: 4.4 g/dL (ref 3.8–4.8)
Alkaline Phosphatase: 71 IU/L (ref 39–117)
BUN/Creatinine Ratio: 20 (ref 12–28)
BUN: 16 mg/dL (ref 8–27)
Bilirubin Total: 0.5 mg/dL (ref 0.0–1.2)
CO2: 28 mmol/L (ref 20–29)
Calcium: 9.4 mg/dL (ref 8.7–10.3)
Chloride: 103 mmol/L (ref 96–106)
Creatinine, Ser: 0.8 mg/dL (ref 0.57–1.00)
GFR calc Af Amer: 92 mL/min/{1.73_m2} (ref >59)
GFR calc non Af Amer: 80 mL/min/{1.73_m2} (ref >59)
Globulin, Total: 2.1 g/dL (ref 1.5–4.5)
Glucose: 91 mg/dL (ref 65–99)
Potassium: 4.2 mmol/L (ref 3.5–5.2)
Sodium: 143 mmol/L (ref 134–144)
Total Protein: 6.5 g/dL (ref 6.0–8.5)

## 2019-10-25 LAB — LIPID PANEL
Chol/HDL Ratio: 4.9 ratio — ABNORMAL HIGH (ref 0.0–4.4)
Cholesterol, Total: 226 mg/dL — ABNORMAL HIGH (ref 100–199)
HDL: 46 mg/dL (ref >39)
LDL Chol Calc (NIH): 152 mg/dL — ABNORMAL HIGH (ref 0–99)
Triglycerides: 153 mg/dL — ABNORMAL HIGH (ref 0–149)
VLDL Cholesterol Cal: 28 mg/dL (ref 5–40)

## 2019-10-25 LAB — CBC
Hematocrit: 41.7 % (ref 34.0–46.6)
Hemoglobin: 13.7 g/dL (ref 11.1–15.9)
MCH: 30.1 pg (ref 26.6–33.0)
MCHC: 32.9 g/dL (ref 31.5–35.7)
MCV: 92 fL (ref 79–97)
Platelets: 212 10*3/uL (ref 150–450)
RBC: 4.55 x10E6/uL (ref 3.77–5.28)
RDW: 11.9 % (ref 11.7–15.4)
WBC: 4.3 10*3/uL (ref 3.4–10.8)

## 2019-10-25 LAB — TSH: TSH: 1.5 u[IU]/mL (ref 0.450–4.500)

## 2019-10-25 LAB — HEMOGLOBIN A1C
Est. average glucose Bld gHb Est-mCnc: 103 mg/dL
Hgb A1c MFr Bld: 5.2 % (ref 4.8–5.6)

## 2019-10-26 NOTE — Progress Notes (Signed)
Established Patient Office Visit  Subjective:  Patient ID: Marissa Duarte, female    DOB: Apr 05, 1961  Age: 59 y.o. MRN: [id number]  CC:  Chief Complaint  Patient presents with  . Hypertension    HPI Marissa Duarte presents for chronic follow-up on hypertension and elevated LDL cholesterol.   HTN: Pt denies chest pain, palpitations, dizziness or lower extremity swelling. Taking medication as directed without side effects. Checks BP at home once in a while and readings range in 127-135/70s-80s. Pt follows a low salt diet.  HLD: Pt reports she hasn't been as diligent with following a heart healthy diet and exercising. She had shingles about 6 months ago and had Covid-19 last month and her energy levels were down. But she is feeling much better now and plans to resume her dietary changes and increase physical activity.   Shingles vaccine: Pt requested to get shingles vaccine today. She hasn't received any Covid-19 vaccines and fully recovered from shingles months ago.  Past Medical History:  Diagnosis Date  . Acid reflux   . Esophagitis 3/11    noted on EGD  . Hypertension   . Kidney stone     Past Surgical History:  Procedure Laterality Date  . BREAST LUMPECTOMY  age 12   fibroadenoma  . COLONOSCOPY    . TUBAL LIGATION  1990  . UPPER GASTROINTESTINAL ENDOSCOPY    . WISDOM TOOTH EXTRACTION      Family History  Problem Relation Age of Onset  . Hypertension Father   . Polycystic kidney disease Father   . Cancer Father        lung  . Hypertension Mother   . Infertility Sister   . Hypertension Sister   . Polycystic kidney disease Sister   . Healthy Brother   . Heart attack Maternal Uncle   . Diabetes Maternal Grandfather   . Colon cancer Neg Hx   . Esophageal cancer Neg Hx   . Rectal cancer Neg Hx   . Stomach cancer Neg Hx     Social History   Socioeconomic History  . Marital status: Married    Spouse name: Not on file  . Number of children: Not on file  .  Years of education: Not on file  . Highest education level: Not on file  Occupational History  . Not on file  Tobacco Use  . Smoking status: Former Smoker    Packs/day: 0.50    Years: 20.00    Pack years: 10.00    Types: Cigarettes    Quit date: 06/19/1996    Years since quitting: 23.3  . Smokeless tobacco: Never Used  Substance and Sexual Activity  . Alcohol use: Yes    Alcohol/week: 6.0 standard drinks    Types: 6 Glasses of wine per week  . Drug use: No  . Sexual activity: Yes    Partners: Male    Birth control/protection: Surgical    Comment: BTL  Other Topics Concern  . Not on file  Social History Narrative  . Not on file   Social Determinants of Health   Financial Resource Strain:   . Difficulty of Paying Living Expenses:   Food Insecurity:   . Worried About Programme researcher, broadcasting/film/video in the Last Year:   . Barista in the Last Year:   Transportation Needs:   . Freight forwarder (Medical):   Marland Kitchen Lack of Transportation (Non-Medical):   Physical Activity:   . Days of Exercise per  Week:   . Minutes of Exercise per Session:   Stress:   . Feeling of Stress :   Social Connections:   . Frequency of Communication with Friends and Family:   . Frequency of Social Gatherings with Friends and Family:   . Attends Religious Services:   . Active Member of Clubs or Organizations:   . Attends Banker Meetings:   Marland Kitchen Marital Status:   Intimate Partner Violence:   . Fear of Current or Ex-Partner:   . Emotionally Abused:   Marland Kitchen Physically Abused:   . Sexually Abused:     Outpatient Medications Prior to Visit  Medication Sig Dispense Refill  . Ascorbic Acid (VITAMIN C) 1000 MG tablet Take 1,000 mg by mouth daily.    . Esomeprazole Magnesium (NEXIUM PO) Take by mouth. OTC- every other day    . Omega-3 Fatty Acids (FISH OIL PO) Take by mouth daily.    . valsartan-hydrochlorothiazide (DIOVAN-HCT) 160-25 MG tablet TAKE 1/2 TABLET BY MOUTH DAILY. OFFICE VISIT REQUIRED  PRIOR TO ANY FUTHER REFILLS PER MD 15 tablet 0  . gabapentin (NEURONTIN) 300 MG capsule 1 tab day one. 1 tab every 12 hrs day two as needed. 1 tab every 8 hrs as needed day three-hold at this dose- as needed. 60 capsule 0   No facility-administered medications prior to visit.    No Known Allergies  ROS Review of Systems  Review of Systems:  A fourteen system review of systems was performed and found to be positive as per HPI.  Objective:    Physical Exam  General:  Well Developed, well nourished, appropriate for stated age.  Neuro:  Alert and oriented,  extra-ocular muscles intact  HEENT:  Normocephalic, atraumatic, neck supple Skin:  no gross rash, warm, pink. Cardiac:  RRR, S1 S2 Respiratory:  ECTA B/L, Not using accessory muscles, speaking in full sentences- unlabored. Vascular:  Ext warm, no cyanosis apprec.; cap RF less 2 sec. Psych:  No HI/SI, judgement and insight good, Euthymic mood. Full Affect.   BP 137/83   Pulse 93   Temp 98.1 F (36.7 C) (Oral)   Ht 5\' 4"  (1.626 m)   Wt 193 lb 1.6 oz (87.6 kg)   LMP 06/19/2012   SpO2 92%   BMI 33.15 kg/m  Wt Readings from Last 3 Encounters:  10/27/19 193 lb 1.6 oz (87.6 kg)  04/29/19 196 lb 14.4 oz (89.3 kg)  04/01/19 196 lb 14.4 oz (89.3 kg)     Health Maintenance Due  Topic Date Due  . HIV Screening  Never done  . COVID-19 Vaccine (1) Never done  . PAP SMEAR-Modifier  07/29/2019    There are no preventive care reminders to display for this patient.  Lab Results  Component Value Date   TSH 1.500 10/24/2019   Lab Results  Component Value Date   WBC 4.3 10/24/2019   HGB 13.7 10/24/2019   HCT 41.7 10/24/2019   MCV 92 10/24/2019   PLT 212 10/24/2019   Lab Results  Component Value Date   NA 143 10/24/2019   K 4.2 10/24/2019   CO2 28 10/24/2019   GLUCOSE 91 10/24/2019   BUN 16 10/24/2019   CREATININE 0.80 10/24/2019   BILITOT 0.5 10/24/2019   ALKPHOS 71 10/24/2019   AST 22 10/24/2019   ALT 19  10/24/2019   PROT 6.5 10/24/2019   ALBUMIN 4.4 10/24/2019   CALCIUM 9.4 10/24/2019   Lab Results  Component Value Date   CHOL 226 (  H) 10/24/2019   Lab Results  Component Value Date   HDL 46 10/24/2019   Lab Results  Component Value Date   LDLCALC 152 (H) 10/24/2019   Lab Results  Component Value Date   TRIG 153 (H) 10/24/2019   Lab Results  Component Value Date   CHOLHDL 4.9 (H) 10/24/2019   Lab Results  Component Value Date   HGBA1C 5.2 10/24/2019      Assessment & Plan:   Problem List Items Addressed This Visit      Cardiovascular and Mediastinum   Essential hypertension, benign - Primary   Relevant Medications   valsartan-hydrochlorothiazide (DIOVAN-HCT) 160-25 MG tablet     Other   Elevated LDL cholesterol level    Other Visit Diagnoses    Need for shingles vaccine       Relevant Orders   Varicella-zoster vaccine IM (Completed)      Meds ordered this encounter  Medications  . valsartan-hydrochlorothiazide (DIOVAN-HCT) 160-25 MG tablet    Sig: TAKE 1/2 TABLET BY MOUTH DAILY.    Dispense:  90 tablet    Refill:  1   HTN: - BP today is 137/83, stable. - Continue Valsartan-HCTZ 160-25 mg 0.5 tablet daily - Encourage ambulatory BP and pulse monitoring. - Continue DASH diet. - Encourage to stay as active as possible. - Most recent CMP is wnl's.  Elevated LDL cholesterol: - Discussed with patient most recent lipid panel results- total cholesterol, triglycerides, and LDL increased from prior. Pt reports not being as diligent with following low cholesterol/sat and trans fat diet as initially she was and has not been very active. Pt would like to resume dietary and lifestyle changes to help improve cholesterol levels before starting medication. Will plan to recheck lipid panel in 6 months and if still elevated, then possibly consider starting statin therapy. Pt verbalized understanding and is agreeable. - Will continue to monitor.   Shingrix: - Discussed  with patient there is no specific timeframe for waiting to get shingles vaccine, no rash is present and she fully recovered months ago so would like to proceed with getting vaccine today.   Follow-up: Return for HTN and HLD in 6 months and FBW (lipid panel) 1 wk prior.    Lorrene Reid, PA-C

## 2019-10-27 ENCOUNTER — Other Ambulatory Visit: Payer: Self-pay

## 2019-10-27 ENCOUNTER — Ambulatory Visit (INDEPENDENT_AMBULATORY_CARE_PROVIDER_SITE_OTHER): Payer: 59 | Admitting: Physician Assistant

## 2019-10-27 ENCOUNTER — Encounter: Payer: Self-pay | Admitting: Physician Assistant

## 2019-10-27 VITALS — BP 137/83 | HR 93 | Temp 98.1°F | Ht 64.0 in | Wt 193.1 lb

## 2019-10-27 DIAGNOSIS — Z23 Encounter for immunization: Secondary | ICD-10-CM

## 2019-10-27 DIAGNOSIS — I10 Essential (primary) hypertension: Secondary | ICD-10-CM | POA: Diagnosis not present

## 2019-10-27 DIAGNOSIS — E7800 Pure hypercholesterolemia, unspecified: Secondary | ICD-10-CM

## 2019-10-27 DIAGNOSIS — D649 Anemia, unspecified: Secondary | ICD-10-CM

## 2019-10-27 MED ORDER — VALSARTAN-HYDROCHLOROTHIAZIDE 160-25 MG PO TABS: ORAL_TABLET | ORAL | 1 refills | Status: DC

## 2019-10-27 NOTE — Patient Instructions (Signed)
Guidelines for a Low Cholesterol, Low Saturated Fat Diet ° °Fats °- Limit total intake of fats and oils. °- Avoid butter, stick margarine, shortening, lard, palm and coconut oils. °- Limit mayonnaise, salad dressings, gravies and sauces, unless they are homemade with low-fat ingredients. °- Limit chocolate. °- Choose low-fat and nonfat products, such as low-fat mayonnaise, low-fat or non-hydrogenated peanut butter, low-fat or fat-free salad dressings and nonfat gravy. °- Use vegetable oil, such as canola or olive oil. °- Look for margarine that does not contain trans fatty acids. °- Use nuts in moderate amounts. °- Read ingredient labels carefully to determine both amount and type of fat present in foods. Limit saturated and trans fats! °- Avoid high-fat processed and convenience foods. ° °Meats and Meat Alternatives °- Choose fish, chicken, turkey and lean meats. °- Use dried beans, peas, lentils and tofu. °- Limit egg yolks to three to four per week. °- If you eat red meat, limit to no more than three servings per week and choose loin or round cuts. °- Avoid fatty meats, such as bacon, sausage, franks, luncheon meats and ribs. °- Avoid all organ meats, including liver. ° °Dairy °- Choose nonfat or low-fat milk, yogurt and cottage cheese. °- Most cheeses are high in fat. Choose cheeses made from non-fat milk, such as mozzarella and ricotta cheese. °- Choose light or fat-free cream cheese and sour cream. °- Avoid cream and sauces made with cream. ° °Fruits and Vegetables °- Eat a wide variety of fruits and vegetables. °- Use lemon juice, vinegar or "mist" olive oil on vegetables. °- Avoid adding sauces, fat or oil to vegetables. ° °Breads, Cereals and Grains °- Choose whole-grain breads, cereals, pastas and rice. °- Avoid high-fat snack foods, such as granola, cookies, pies, pastries, doughnuts and croissants. ° °Cooking Tips °- Avoid deep fried foods. °- Trim visible fat off meats and remove skin from poultry  before cooking. °- Bake, broil, boil, poach or roast poultry, fish and lean meats. °- Drain and discard fat that drains out of meat as you cook it. °- Add little or no fat to foods. °- Use vegetable oil sprays to grease pans for cooking or baking. °- Steam vegetables. °- Use herbs or no-oil marinades to flavor foods. °Nine ways to increase your "good" HDL cholesterol ° °High-density lipoprotein (HDL) is often referred to as the "good" cholesterol. °Having high HDL levels helps carry cholesterol from your arteries to your liver, where it can be used or excreted. ° °Having high levels of HDL also has antioxidant and anti-inflammatory effects, and is linked to a reduced risk of heart disease (1, 2). ° °Most health experts recommend minimum blood levels of 40 mg/dl in men and 50 mg/dl in women. ° °While genetics definitely play a role, there are several other factors that affect HDL levels. ° °Here are nine healthy ways to raise your "good" HDL cholesterol. ° °1. Consume olive oil ° °two pieces of salmon on a plate °olive oil being poured into a small dish °Extra virgin olive oil may be more healthful than processed olive oils. °Olive oil is one of the healthiest fats around. ° °A large analysis of 42 studies with more than 800,000 participants found that olive oil was the only source of monounsaturated fat that seemed to reduce heart disease risk (3). ° °Research has shown that one of olive oil's heart-healthy effects is an increase in HDL cholesterol. This effect is thought to be caused by antioxidants it contains called polyphenols (  4, 5, 6, 7). ° °Extra virgin olive oil has more polyphenols than more processed olive oils, although the amount can still vary among different types and brands. ° °One study gave 200 healthy young men about 2 tablespoons (25 ml) of different olive oils per day for three weeks. ° °The researchers found that participants' HDL levels increased significantly more after they consumed the olive  oil with the highest polyphenol content (6). ° °In another study, when 62 older adults consumed about 4 tablespoons (50 ml) of high-polyphenol extra virgin olive oil every day for six weeks, their HDL cholesterol increased by 6.5 mg/dl, on average (7). ° °In addition to raising HDL levels, olive oil has been found to boost HDL's anti-inflammatory and antioxidant function in studies of older people and individuals with high cholesterol levels ( 7, 8, 9). ° °Whenever possible, select high-quality, certified extra virgin olive oils, which tend to be highest in polyphenols. ° °Bottom line: Extra virgin olive oil with a high polyphenol content has been shown to increase HDL levels in healthy people, the elderly and individuals with high cholesterol. ° °2. Follow a low-carb or ketogenic diet ° °Low-carb and ketogenic diets provide a number of health benefits, including weight loss and reduced blood sugar levels. ° °They have also been shown to increase HDL cholesterol in people who tend to have lower levels. ° °This includes those who are obese, insulin resistant or diabetic (10, 11, 12, 13, 14, 15, 16, 17). ° °In one study, people with type 2 diabetes were split into two groups. ° °One followed a diet consuming less than 50 grams of carbs per day. The other followed a high-carb diet. ° °Although both groups lost weight, the low-carb group's HDL cholesterol increased almost twice as much as the high-carb group's did (14). ° °In another study, obese people who followed a low-carb diet experienced an increase in HDL cholesterol of 5 mg/dl overall. ° °Meanwhile, in the same study, the participants who ate a low-fat, high-carb diet showed a decrease in HDL cholesterol (15). ° °This response may partially be due to the higher levels of fat people typically consume on low-carb diets. ° °One study in overweight women found that diets high in meat and cheese increased HDL levels by 5-8%, compared to a higher-carb diet  (18). ° °What's more, in addition to raising HDL cholesterol, very-low-carb diets have been shown to decrease triglycerides and improve several other risk factors for heart disease (13, 14, 16, 17). ° °Bottom line: Low-carb and ketogenic diets typically increase HDL cholesterol levels in people with diabetes, metabolic syndrome and obesity. ° °3. Exercise regularly ° °Being physically active is important for heart health. ° °Studies have shown that many different types of exercise are effective at raising HDL cholesterol, including strength training, high-intensity exercise and aerobic exercise (19, 20, 21, 22, 23, 24). ° °However, the biggest increases in HDL are typically seen with high-intensity exercise. ° °One small study followed women who were living with polycystic ovary syndrome (PCOS), which is linked to a higher risk of insulin resistance. The study required them to perform high-intensity exercise three times a week. ° °The exercise led to an increase in HDL cholesterol of 8 mg/dL after 10 weeks. The women also showed improvements in other health markers, including decreased insulin resistance and improved arterial function (23). ° °In a 12-week study, overweight men who performed high-intensity exercise experienced a 10% increase in HDL cholesterol. ° °In contrast, the low-intensity exercise group showed only a   2% increase and the endurance training group experienced no change (24). ° °However, even lower-intensity exercise seems to increase HDL's anti-inflammatory and antioxidant capacities, whether or not HDL levels change (20, 21, 25). ° °Overall, high-intensity exercise such as high-intensity interval training (HIIT) and high-intensity circuit training (HICT) may boost HDL cholesterol levels the most. ° °Bottom line: Exercising several times per week can help raise HDL cholesterol and enhance its anti-inflammatory and antioxidant effects. High-intensity forms of exercise may be especially  effective. ° °4. Add coconut oil to your diet ° °Studies have shown that coconut oil may reduce appetite, increase metabolic rate and help protect brain health, among other benefits. ° °Some people may be concerned about coconut oil's effects on heart health due to its high saturated fat content. ° °However, it appears that coconut oil is actually quite heart healthy. ° °Coconut oil tends to raise HDL cholesterol more than many other types of fat. ° °In addition, it may improve the ratio of low-density-lipoprotein (LDL) cholesterol, the "bad" cholesterol, to HDL cholesterol. Improving this ratio reduces heart disease risk (26, 27, 28, 29). ° °One study examined the health effects of coconut oil on 40 women with excess belly fat. The researchers found that participants who took coconut oil daily experienced increased HDL cholesterol and a lower LDL-to-HDL ratio. ° °In contrast, the group who took soybean oil daily had a decrease in HDL cholesterol and an increase in the LDL-to-HDL ratio (29). ° °Most studies have found these health benefits occur at a dosage of about 2 tablespoons (30 ml) of coconut oil per day. It's best to incorporate this into cooking rather than eating spoonfuls of coconut oil on their own. ° °Bottom line: Consuming 2 tablespoons (30 ml) of coconut oil per day may help increase HDL cholesterol levels. ° °5. Stop smoking ° °cigarette butt °Quitting smoking can reduce the risk of heart disease and lung cancer. °Smoking increases the risk of many health problems, including heart disease and lung cancer (30). ° °One of its negative effects is a suppression of HDL cholesterol. ° °Some studies have found that quitting smoking can increase HDL levels. Indeed, one study found no significant differences in HDL levels between former smokers and people who had never smoked (31, 32, 33, 34, 35). ° °In a one-year study of more than 1,500 people, those who quit smoking had twice the increase in HDL as those  who resumed smoking within the year. The number of large HDL particles also increased, which further reduced heart disease risk (32). ° °One study followed smokers who switched from traditional cigarettes to electronic cigarettes for one year. They found that the switch was associated with an increase in HDL cholesterol of 5 mg/dl, on average (33). ° °When it comes to the effect of nicotine replacement patches on HDL levels, research results have been mixed. ° °One study found that nicotine replacement therapy led to higher HDL cholesterol. However, other research suggests that people who use nicotine patches likely won't see increases in HDL levels until after replacement therapy is completed (34, 36). ° °Even in studies where HDL cholesterol levels didn't increase after people quit smoking, HDL function improved, resulting in less inflammation and other beneficial effects on heart health (37). ° °Bottom line: Quitting smoking can increase HDL levels, improve HDL function and help protect heart health. ° °6. Lose weight ° °When overweight and obese people lose weight, their HDL cholesterol levels usually increase. ° °What's more, this benefit seems to occur whether weight   loss is achieved by calorie counting, carb restriction, intermittent fasting, weight loss surgery or a combination of diet and exercise (16, 38, 39, 40, 41, 42). ° °One study examined HDL levels in more than 3,000 overweight and obese [place] adults who followed a lifestyle modification program for one year. ° °The researchers found that losing at least 6.6 lbs (3 kg) led to an increase in HDL cholesterol of 4 mg/dl, on average (41). ° °In another study, when obese people with type 2 diabetes consumed calorie-restricted diets that provided 20-30% of calories from protein, they experienced significant increases in HDL cholesterol levels (42). ° °The key to achieving and maintaining healthy HDL cholesterol levels is choosing the type of diet that  makes it easiest for you to lose weight and keep it off. ° °Bottom Line: Several methods of weight loss have been shown to increase HDL cholesterol levels in people who are overweight or obese. ° °7. Choose purple produce ° °Consuming purple-colored fruits and vegetables is a delicious way to potentially increase HDL cholesterol. ° °Purple produce contains antioxidants known as anthocyanins. ° °Studies using anthocyanin extracts have shown that they help fight inflammation, protect your cells from damaging free radicals and may also raise HDL cholesterol levels (43, 44, 45, 46). ° °In a 24-week study of 58 people with diabetes, those who took an anthocyanin supplement twice a day experienced a 19% increase in HDL cholesterol, on average, along with other improvements in heart health markers (45). ° °In another study, when people with cholesterol issues took anthocyanin extract for 12 weeks, their HDL cholesterol levels increased by 13.7% (46). ° °Although these studies used extracts instead of foods, there are several fruits and vegetables that are very high in anthocyanins. These include eggplant, purple corn, red cabbage, blueberries, blackberries and black raspberries. ° °Bottom line: Consuming fruits and vegetables rich in anthocyanins may help increase HDL cholesterol levels. ° °8. Eat fatty fish often ° °The omega-3 fats in fatty fish provide major benefits to heart health, including a reduction in inflammation and better functioning of the cells that line your arteries (47, 48). ° °There's some research showing that eating fatty fish or taking fish oil may also help raise low levels of HDL cholesterol (49, 50, 51, 52, 53). ° °In a study of 33 heart disease patients, participants that consumed fatty fish four times per week experienced an increase in HDL cholesterol levels. The particle size of their HDL also increased (52). ° °In another study, overweight men who consumed herring five days a week for six  weeks had a 5% increase in HDL cholesterol, compared with their levels after eating lean pork and chicken five days a week (53). ° °However, there are a few studies that found no increase in HDL cholesterol in response to increased fish or omega-3 supplement intake (54, 55). ° °In addition to herring, other types of fatty fish that may help raise HDL cholesterol include salmon, sardines, mackerel and anchovies. ° °Bottom line: Eating fatty fish several times per week may help increase HDL cholesterol levels and provide other benefits to heart health. ° °9. Avoid artificial trans fats ° °Artificial trans fats have many negative health effects due to their inflammatory properties (56, 57). ° °There are two types of trans fats. One kind occurs naturally in animal products, including full-fat dairy. ° °In contrast, the artificial trans fats found in margarines and processed foods are created by adding hydrogen to unsaturated vegetable and seed oils. These fats are also   known as industrial trans fats or partially hydrogenated fats. ° °Research has shown that, in addition to increasing inflammation and contributing to several health problems, these artificial trans fats may lower HDL cholesterol levels. ° °In one study, researchers compared how people's HDL levels responded when they consumed different margarines. ° °The study found that participants' HDL cholesterol levels were 10% lower after consuming margarine containing partially hydrogenated soybean oil, compared to their levels after consuming palm oil (58). ° °Another controlled study followed 40 adults who had diets high in different types of trans fats. ° °They found that HDL cholesterol levels in women were significantly lower after they consumed the diet high in industrial trans fats, compared to the diet containing naturally occurring trans fats (59). ° °To protect heart health and keep HDL cholesterol in the healthy range, it's best to avoid artificial trans  fats altogether. ° °Bottom line: Artificial trans fats have been shown to lower HDL levels and increase inflammation, compared to other fats. ° °Take home message ° °Although your HDL cholesterol levels are partly determined by your genetics, there are many things you can do to naturally increase your own levels. ° °Fortunately, the practices that raise HDL cholesterol often provide other health benefits as well. ° °

## 2019-12-30 ENCOUNTER — Encounter: Payer: Self-pay | Admitting: Obstetrics & Gynecology

## 2020-04-06 ENCOUNTER — Ambulatory Visit: Payer: 59 | Admitting: Obstetrics & Gynecology

## 2020-04-26 ENCOUNTER — Other Ambulatory Visit: Payer: 59

## 2020-04-29 ENCOUNTER — Ambulatory Visit: Payer: 59 | Admitting: Physician Assistant

## 2020-05-04 ENCOUNTER — Other Ambulatory Visit: Payer: Self-pay | Admitting: Physician Assistant

## 2020-05-04 ENCOUNTER — Other Ambulatory Visit: Payer: Self-pay

## 2020-05-04 DIAGNOSIS — E7800 Pure hypercholesterolemia, unspecified: Secondary | ICD-10-CM

## 2020-05-04 DIAGNOSIS — Z0000 Encounter for general adult medical examination without abnormal findings: Secondary | ICD-10-CM

## 2020-05-06 ENCOUNTER — Other Ambulatory Visit: Payer: Self-pay

## 2020-05-06 ENCOUNTER — Other Ambulatory Visit: Payer: 59

## 2020-05-06 DIAGNOSIS — Z0000 Encounter for general adult medical examination without abnormal findings: Secondary | ICD-10-CM

## 2020-05-06 DIAGNOSIS — E7800 Pure hypercholesterolemia, unspecified: Secondary | ICD-10-CM

## 2020-05-08 LAB — LIPID PANEL
Chol/HDL Ratio: 3.7 ratio (ref 0.0–4.4)
Cholesterol, Total: 201 mg/dL — ABNORMAL HIGH (ref 100–199)
HDL: 55 mg/dL (ref >39)
LDL Chol Calc (NIH): 127 mg/dL — ABNORMAL HIGH (ref 0–99)
Triglycerides: 106 mg/dL (ref 0–149)
VLDL Cholesterol Cal: 19 mg/dL (ref 5–40)

## 2020-05-08 LAB — SARS-COV-2 ANTIBODY, IGM: SARS-CoV-2 Antibody, IgM: NEGATIVE

## 2020-05-10 ENCOUNTER — Ambulatory Visit (INDEPENDENT_AMBULATORY_CARE_PROVIDER_SITE_OTHER): Payer: 59 | Admitting: Physician Assistant

## 2020-05-10 ENCOUNTER — Other Ambulatory Visit: Payer: Self-pay

## 2020-05-10 ENCOUNTER — Encounter: Payer: Self-pay | Admitting: Physician Assistant

## 2020-05-10 VITALS — BP 136/74 | Temp 97.9°F | Ht 64.0 in | Wt 195.0 lb

## 2020-05-10 DIAGNOSIS — E7800 Pure hypercholesterolemia, unspecified: Secondary | ICD-10-CM | POA: Diagnosis not present

## 2020-05-10 DIAGNOSIS — I10 Essential (primary) hypertension: Secondary | ICD-10-CM

## 2020-05-10 NOTE — Progress Notes (Signed)
Telehealth office visit note for Marissa Masker, PA-C- at Primary Care at [facility]   I connected with current patient today by telephone and verified that I am speaking with the correct person    Location of the patient: Home  Location of the provider: Office - This visit type was conducted due to national recommendations for restrictions regarding the COVID-19 Pandemic (e.g. social distancing) in an effort to limit this patient's exposure and mitigate transmission in our community.    - No physical exam could be performed with this format, beyond that communicated to [place] by the patient/ family members as noted.   - Additionally my office staff/ schedulers were to discuss with the patient that there may be a monetary charge related to this service, depending on their medical insurance.  My understanding is that patient understood and consented to proceed.     _________________________________________________________________________________   History of Present Illness: Patient calls in to follow-up on hypertension and hyperlipidemia.  Has no acute concerns.  HTN: Pt denies chest pain, palpitations, dizziness, headache or lower extremity swelling. Taking medication as directed without side effects. Checks BP at home sometimes. Pt reports good hydration.  HLD: Pt has made dietary changes to help improve cholesterol.  Reports has decreased red meat and is trying to stay more active by walking or using stationary bike.     No flowsheet data found.  Depression screen [facility] 2/9 05/10/2020 10/27/2019 04/29/2019 04/01/2019 12/10/2017  Decreased Interest 0 0 0 0 0  Down, Depressed, Hopeless 0 0 0 0 0  PHQ - 2 Score 0 0 0 0 0  Altered sleeping 0 1 1 1  0  Tired, decreased energy 0 0 0 0 0  Change in appetite 0 0 0 0 0  Feeling bad or failure about yourself  0 0 0 0 0  Trouble concentrating 0 0 0 0 0  Moving slowly or fidgety/restless 0 0 0 0 0  Suicidal thoughts 0 0 0 0 0  PHQ-9  Score 0 1 1 1  0  Difficult doing work/chores - Not difficult at all Not difficult at all Not difficult at all -      Impression and Recommendations:     1. Essential hypertension, benign   2. Elevated LDL cholesterol level     Essential hypertension, benign: -BP fairly controlled. -Continue current medication regimen. -Continue good hydration and follow low-sodium diet. -Continue to stay as active as possible. -Will continue to monitor.  Elevated LDL cholesterol level: -Recent lipid panel improved from prior: Total cholesterol decreased from 226-201, triglycerides 106, HDL 55, LDL decreased from 152-127. -Encouraged patient to continue with dietary changes and follow a diet low in saturated and trans fats.  -Continue to stay active and gradually increase physical activity.  -Will continue to monitor.   - As part of my medical decision making, I reviewed the following data within the electronic MEDICAL RECORD NUMBER History obtained from pt /family, CMA notes reviewed and incorporated if applicable, Labs reviewed, Radiograph/ tests reviewed if applicable and OV notes from prior OV's with me, as well as any other specialists she/he has seen since seeing me last, were all reviewed and used in my medical decision making process today.    - Additionally, when appropriate, discussion had with patient regarding our treatment plan, and their biases/concerns about that plan were used in my medical decision making today.    - The patient agreed with the plan and demonstrated an understanding  of the instructions.   No barriers to understanding were identified.     - The patient was advised to call back or seek an in-person evaluation if the symptoms worsen or if the condition fails to improve as anticipated.   Return in about 6 months (around 11/07/2020) for CPE and FBW.    No orders of the defined types were placed in this encounter.   No orders of the defined types were placed in this  encounter.   There are no discontinued medications.     Time spent on visit including pre-visit chart review and post-visit care was 10 minutes.      The 21st Century Cures Act was signed into law in 2016 which includes the topic of electronic health records.  This provides immediate access to information in MyChart.  This includes consultation notes, operative notes, office notes, lab results and pathology reports.  If you have any questions about what you read please let us know at your next visit or call us at the office.  We are right here with you.  Note:  This note was prepared with assistance of Dragon voice recognition software. Occasional wrong-word or sound-a-like substitutions may have occurred due to the inherent limitations of voice recognition software.  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   Esomeprazole Magnesium (NEXIUM PO) Take by mouth. OTC- every other day   Omega-3 Fatty Acids (FISH OIL PO) Take by mouth daily.   Turmeric (QC TUMERIC COMPLEX PO) Take by mouth.   valsartan-hydrochlorothiazide (DIOVAN-HCT)  160-25 MG tablet TAKE 1/2 TABLET BY MOUTH DAILY.     Allergies:  No Known Allergies   ROS:  See above HPI for pertinent positives and negatives   Objective:   Blood pressure 136/74, temperature 97.9 F (36.6 C), height 5\' 4"  (1.626 m), weight 195 lb (88.5 kg), last menstrual period 06/19/2012.  (if some vitals are omitted, this means that patient was UNABLE to obtain them even though they were asked to get them prior to OV today.  They were asked to call 08/17/2012 at their earliest convenience with these once obtained. ) General: A & O * 3; sounds in no acute distress Respiratory: speaking in full sentences, no conversational dyspnea Psych: insight appears good, mood- appears full

## 2020-05-31 ENCOUNTER — Other Ambulatory Visit: Payer: Self-pay

## 2020-05-31 ENCOUNTER — Other Ambulatory Visit (HOSPITAL_COMMUNITY)
Admission: RE | Admit: 2020-05-31 | Discharge: 2020-05-31 | Disposition: A | Discharge status: Home / Self Care | Payer: 59 | Source: Ambulatory Visit | Attending: Obstetrics & Gynecology | Admitting: Obstetrics & Gynecology

## 2020-05-31 ENCOUNTER — Ambulatory Visit (INDEPENDENT_AMBULATORY_CARE_PROVIDER_SITE_OTHER): Payer: 59 | Admitting: Obstetrics & Gynecology

## 2020-05-31 ENCOUNTER — Encounter: Payer: Self-pay | Admitting: Obstetrics & Gynecology

## 2020-05-31 VITALS — BP 130/84 | HR 111 | Ht 64.0 in | Wt 200.0 lb

## 2020-05-31 DIAGNOSIS — E7800 Pure hypercholesterolemia, unspecified: Secondary | ICD-10-CM

## 2020-05-31 DIAGNOSIS — Z87891 Personal history of nicotine dependence: Secondary | ICD-10-CM | POA: Diagnosis not present

## 2020-05-31 DIAGNOSIS — Z124 Encounter for screening for malignant neoplasm of cervix: Secondary | ICD-10-CM | POA: Diagnosis not present

## 2020-05-31 DIAGNOSIS — I10 Essential (primary) hypertension: Secondary | ICD-10-CM

## 2020-05-31 DIAGNOSIS — Z23 Encounter for immunization: Secondary | ICD-10-CM | POA: Insufficient documentation

## 2020-05-31 DIAGNOSIS — Z01419 Encounter for gynecological examination (general) (routine) without abnormal findings: Secondary | ICD-10-CM | POA: Diagnosis not present

## 2020-05-31 NOTE — Progress Notes (Signed)
58 y.o. G2P2 Married White or Caucasian female here for annual exam.  Denies vaginal bleeding.  Doing well.  Had mild Covid earlier this year.  Antibody testing recently was negative.  Declines vaccination.  Granddaughter is 3 and so much fun.  Patient's last menstrual period was 06/19/2012.          Sexually active: Yes.    The current method of family planning is post menopausal status.    Exercising: Yes.    Smoker:  Former smoker  Health Maintenance: Pap:  Will obtained today History of abnormal Pap:  no MMG:  12/2019 Colonoscopy:  09/2016 follow up 10 yeas BMD:   Not done yet TDaP:  2017 Pneumonia vaccine(s):  After age 11 Shingrix:   Was vaccinated this year Hep C testing: 08/2016 Screening Labs: 10/2019 and 04/2020   reports that she quit smoking about 23 years ago. Her smoking use included cigarettes. She has a 10.00 pack-year smoking history. She has never used smokeless tobacco. She reports current alcohol use of about 6.0 standard drinks of alcohol per week. She reports that she does not use drugs.  Past Medical History:  Diagnosis Date  . Acid reflux   . Esophagitis 3/11    noted on EGD  . Hypertension   . Kidney stone     Past Surgical History:  Procedure Laterality Date  . BREAST LUMPECTOMY  age 42   fibroadenoma  . COLONOSCOPY    . TUBAL LIGATION  1990  . UPPER GASTROINTESTINAL ENDOSCOPY    . WISDOM TOOTH EXTRACTION      Current Outpatient Medications  Medication Sig Dispense Refill  . Ascorbic Acid (VITAMIN C) 1000 MG tablet Take 1,000 mg by mouth daily.    . Esomeprazole Magnesium (NEXIUM PO) Take by mouth. OTC- every other day    . Omega-3 Fatty Acids (FISH OIL PO) Take by mouth daily.    . Turmeric (QC TUMERIC COMPLEX PO) Take by mouth.    . valsartan-hydrochlorothiazide (DIOVAN-HCT) 160-25 MG tablet TAKE 1/2 TABLET BY MOUTH DAILY. 90 tablet 1   No current facility-administered medications for this visit.    Family History  Problem Relation Age of  Onset  . Hypertension Father   . Polycystic kidney disease Father   . Cancer Father        lung  . Hypertension Mother   . Infertility Sister   . Hypertension Sister   . Polycystic kidney disease Sister   . Healthy Brother   . Heart attack Maternal Uncle   . Diabetes Maternal Grandfather   . Colon cancer Neg Hx   . Esophageal cancer Neg Hx   . Rectal cancer Neg Hx   . Stomach cancer Neg Hx     Review of Systems  Exam:   BP 130/84   Pulse (!) 111   Ht 5\' 4"  (1.626 m)   Wt 200 lb (90.7 kg)   LMP 06/19/2012   BMI 34.33 kg/m   Height: 5\' 4"  (162.6 cm)  General appearance: alert, cooperative and appears stated age Head: Normocephalic, without obvious abnormality, atraumatic Neck: no adenopathy, supple, symmetrical, trachea midline and thyroid normal to inspection and palpation Lungs: clear to auscultation bilaterally Breasts: normal appearance, no masses or tenderness Heart: regular rate and rhythm Abdomen: soft, non-tender; bowel sounds normal; no masses,  no organomegaly Extremities: extremities normal, atraumatic, no cyanosis or edema Skin: Skin color, texture, turgor normal. No rashes or lesions Lymph nodes: Cervical, supraclavicular, and axillary nodes normal. No abnormal inguinal  nodes palpated Neurologic: Grossly normal   Pelvic: External genitalia:  no lesions              Urethra:  normal appearing urethra with no masses, tenderness or lesions              Bartholins and Skenes: normal                 Vagina: normal appearing vagina with normal color and discharge, no lesions              Cervix: no lesions              Pap taken: Yes.   Bimanual Exam:  Uterus:  normal size, contour, position, consistency, mobility, non-tender              Adnexa: normal adnexa and no mass, fullness, tenderness               Rectovaginal: Confirms               Anus:  normal sphincter tone, no lesions  Chaperone was present for exam.  Assessment/Plan: 1. Well woman exam  with routine gynecological exam -MMG done 2021 and in Epic -Pap with HR HPV obtained today -Colonoscopy 2018.  Follow up 10 years. -Lab work done with PCP earlier this year -Flu shot will be given today  2. Cervical cancer screening - Cytology - PAP  3. Influenza vaccine needed  4. Elevated LDL cholesterol level/Hypertension -being followed by PCP and treated with Diovan-HCT

## 2020-06-02 ENCOUNTER — Telehealth: Payer: Self-pay | Admitting: *Deleted

## 2020-06-02 LAB — CYTOLOGY - PAP
Comment: NEGATIVE
Diagnosis: NEGATIVE
High risk HPV: NEGATIVE

## 2020-06-02 NOTE — Telephone Encounter (Signed)
Pt informed of pap results.  

## 2020-06-02 NOTE — Telephone Encounter (Signed)
-----   Message from Jerene Bears, MD sent at 06/02/2020 12:34 PM EST ----- Please let pt know her pap was normal and HR HPV testing was negative.  Thanks.  She is not on mychart.

## 2020-10-13 ENCOUNTER — Other Ambulatory Visit: Payer: Self-pay | Admitting: Physician Assistant

## 2020-10-13 ENCOUNTER — Telehealth: Payer: Self-pay | Admitting: Physician Assistant

## 2020-10-13 DIAGNOSIS — I10 Essential (primary) hypertension: Secondary | ICD-10-CM

## 2020-10-13 NOTE — Telephone Encounter (Signed)
Left voicemail for patient

## 2020-10-13 NOTE — Telephone Encounter (Signed)
Please contact patient to schedule apt per last AVS. AS, CMA 

## 2020-10-25 NOTE — Telephone Encounter (Signed)
Patient was advised she needs to schedule per last AVS for CPE and FBW and she will check with her insurance and call back.

## 2020-11-04 ENCOUNTER — Other Ambulatory Visit: Payer: Self-pay | Admitting: Physician Assistant

## 2020-11-04 ENCOUNTER — Telehealth: Payer: Self-pay | Admitting: Physician Assistant

## 2020-11-04 DIAGNOSIS — I10 Essential (primary) hypertension: Secondary | ICD-10-CM

## 2020-11-04 NOTE — Telephone Encounter (Signed)
Please contact patient to schedule apt per last AVS for med refills. AS< CMA

## 2020-11-04 NOTE — Telephone Encounter (Signed)
Patient left a voicemail.

## 2020-11-29 ENCOUNTER — Other Ambulatory Visit: Payer: Self-pay | Admitting: Physician Assistant

## 2020-11-29 DIAGNOSIS — I10 Essential (primary) hypertension: Secondary | ICD-10-CM

## 2020-12-06 ENCOUNTER — Other Ambulatory Visit: Payer: 59

## 2020-12-06 ENCOUNTER — Other Ambulatory Visit: Payer: Self-pay

## 2020-12-06 DIAGNOSIS — R5383 Other fatigue: Secondary | ICD-10-CM

## 2020-12-06 DIAGNOSIS — D649 Anemia, unspecified: Secondary | ICD-10-CM

## 2020-12-06 DIAGNOSIS — I10 Essential (primary) hypertension: Secondary | ICD-10-CM

## 2020-12-06 DIAGNOSIS — Z13220 Encounter for screening for lipoid disorders: Secondary | ICD-10-CM

## 2020-12-06 DIAGNOSIS — Z131 Encounter for screening for diabetes mellitus: Secondary | ICD-10-CM

## 2020-12-06 NOTE — Addendum Note (Signed)
Addended by: Lupita Leash on: 12/06/2020 08:42 AM   Modules accepted: Orders

## 2020-12-07 LAB — HEMOGLOBIN A1C
Est. average glucose Bld gHb Est-mCnc: 114 mg/dL
Hgb A1c MFr Bld: 5.6 % (ref 4.8–5.6)

## 2020-12-07 LAB — CBC WITH DIFFERENTIAL/PLATELET
Basophils Absolute: 0 10*3/uL (ref 0.0–0.2)
Basos: 1 %
EOS (ABSOLUTE): 0.3 10*3/uL (ref 0.0–0.4)
Eos: 6 %
Hematocrit: 32.1 % — ABNORMAL LOW (ref 34.0–46.6)
Hemoglobin: 9.7 g/dL — ABNORMAL LOW (ref 11.1–15.9)
Immature Grans (Abs): 0 10*3/uL (ref 0.0–0.1)
Immature Granulocytes: 0 %
Lymphocytes Absolute: 1.3 10*3/uL (ref 0.7–3.1)
Lymphs: 27 %
MCH: 23.9 pg — ABNORMAL LOW (ref 26.6–33.0)
MCHC: 30.2 g/dL — ABNORMAL LOW (ref 31.5–35.7)
MCV: 79 fL (ref 79–97)
Monocytes Absolute: 0.5 10*3/uL (ref 0.1–0.9)
Monocytes: 10 %
Neutrophils Absolute: 2.7 10*3/uL (ref 1.4–7.0)
Neutrophils: 56 %
Platelets: 304 10*3/uL (ref 150–450)
RBC: 4.06 x10E6/uL (ref 3.77–5.28)
RDW: 15.2 % (ref 11.7–15.4)
WBC: 4.8 10*3/uL (ref 3.4–10.8)

## 2020-12-07 LAB — LIPID PANEL
Chol/HDL Ratio: 3.4 ratio (ref 0.0–4.4)
Cholesterol, Total: 198 mg/dL (ref 100–199)
HDL: 58 mg/dL (ref >39)
LDL Chol Calc (NIH): 126 mg/dL — ABNORMAL HIGH (ref 0–99)
Triglycerides: 79 mg/dL (ref 0–149)
VLDL Cholesterol Cal: 14 mg/dL (ref 5–40)

## 2020-12-07 LAB — COMPREHENSIVE METABOLIC PANEL
ALT: 15 IU/L (ref 0–32)
AST: 16 IU/L (ref 0–40)
Albumin/Globulin Ratio: 2.4 — ABNORMAL HIGH (ref 1.2–2.2)
Albumin: 4.4 g/dL (ref 3.8–4.8)
Alkaline Phosphatase: 66 IU/L (ref 44–121)
BUN/Creatinine Ratio: 17 (ref 12–28)
BUN: 14 mg/dL (ref 8–27)
Bilirubin Total: 0.4 mg/dL (ref 0.0–1.2)
CO2: 25 mmol/L (ref 20–29)
Calcium: 9 mg/dL (ref 8.7–10.3)
Chloride: 104 mmol/L (ref 96–106)
Creatinine, Ser: 0.82 mg/dL (ref 0.57–1.00)
Globulin, Total: 1.8 g/dL (ref 1.5–4.5)
Glucose: 94 mg/dL (ref 65–99)
Potassium: 4.5 mmol/L (ref 3.5–5.2)
Sodium: 141 mmol/L (ref 134–144)
Total Protein: 6.2 g/dL (ref 6.0–8.5)
eGFR: 81 mL/min/{1.73_m2} (ref >59)

## 2020-12-07 LAB — TSH: TSH: 1.55 u[IU]/mL (ref 0.450–4.500)

## 2020-12-14 ENCOUNTER — Encounter: Payer: Self-pay | Admitting: Physician Assistant

## 2020-12-14 ENCOUNTER — Ambulatory Visit (INDEPENDENT_AMBULATORY_CARE_PROVIDER_SITE_OTHER): Payer: 59 | Admitting: Physician Assistant

## 2020-12-14 ENCOUNTER — Other Ambulatory Visit: Payer: Self-pay

## 2020-12-14 VITALS — BP 122/70 | HR 96 | Temp 99.5°F | Ht 64.0 in | Wt 188.0 lb

## 2020-12-14 DIAGNOSIS — Z23 Encounter for immunization: Secondary | ICD-10-CM

## 2020-12-14 DIAGNOSIS — E7800 Pure hypercholesterolemia, unspecified: Secondary | ICD-10-CM

## 2020-12-14 DIAGNOSIS — Z0000 Encounter for general adult medical examination without abnormal findings: Secondary | ICD-10-CM

## 2020-12-14 DIAGNOSIS — D649 Anemia, unspecified: Secondary | ICD-10-CM | POA: Diagnosis not present

## 2020-12-14 DIAGNOSIS — I10 Essential (primary) hypertension: Secondary | ICD-10-CM

## 2020-12-14 DIAGNOSIS — D509 Iron deficiency anemia, unspecified: Secondary | ICD-10-CM

## 2020-12-14 NOTE — Patient Instructions (Addendum)
Iron-Rich Diet  Iron is a mineral that helps your body produce hemoglobin. Hemoglobin is a protein in red blood cells that carries oxygen to your body's tissues. Eating too little iron may cause you to feel weak and tired, and it can increase your risk of infection. Iron is naturally found in many foods, and many foods have iron added to them (are iron-fortified). You may need to follow an iron-rich diet if you do not have enough iron in your body due to certain medical conditions. The amount of iron that you need each day depends on your age, your sex, and any medical conditions you have. Follow instructions from your health care provider or a dietitian about how much ironyou should eat each day. What are tips for following this plan? Reading food labels Check food labels to see how many milligrams (mg) of iron are in each serving. Cooking Cook foods in pots and pans that are made from iron. Take these steps to make it easier for your body to absorb iron from certain foods: Soak beans overnight before cooking. Soak whole grains overnight and drain them before using. Ferment flours before baking, such as by using yeast in bread dough. Meal planning When you eat foods that contain iron, you should eat them with foods that are high in vitamin C. These include oranges, peppers, tomatoes, potatoes, and mangoes. Vitamin C helps your body absorb iron. Certain foods and drinks prevent your body from absorbing iron properly. Avoid eating these foods in the same meal as iron-rich foods or with iron supplements. These foods include: Coffee, black tea, and red wine. Milk, dairy products, and foods that are high in calcium. Beans and soybeans. Whole grains. General information Take iron supplements only as told by your health care provider. An overdose of iron can be life-threatening. If you were prescribed iron supplements, take them with orange juice or a vitamin C supplement. When you eat iron-fortified  foods or take an iron supplement, you should also eat foods that naturally contain iron, such as meat, poultry, and fish. Eating naturally iron-rich foods helps your body absorb the iron that is added to other foods or contained in a supplement. Iron from animal sources is better absorbed than iron from plant sources. What foods should I eat? Fruits Prunes. Raisins. Eat fruits high in vitamin C, such as oranges, grapefruits, and strawberries,with iron-rich foods. Vegetables Spinach (cooked). Green peas. Broccoli. Fermented vegetables. Eat vegetables high in vitamin C, such as leafy greens, potatoes, bell peppers,and tomatoes, with iron-rich foods. Grains Iron-fortified breakfast cereal. Iron-fortified whole-wheat bread. Enrichedrice. Sprouted grains. Meats and other proteins Beef liver. Beef. [place]. Chicken. Oysters. Shrimp. [place]. Sardines. Chickpeas.Nuts. Tofu. Pumpkin seeds. Beverages Tomato juice. Fresh orange juice. Prune juice. Hibiscus tea. Iron-fortifiedinstant breakfast shakes. Sweets and desserts Blackstrap molasses. Seasonings and condiments Tahini. Fermented soy sauce. Other foods Wheat germ. The items listed above may not be a complete list of recommended foods and beverages. Contact a dietitian for more information. What foods should I limit? These are foods that should be limited while eating iron-rich foods as they canreduce the absorption of iron in your body. Grains Whole grains. Bran cereal. Bran flour. Meats and other proteins Soybeans. Products made from soy protein. Black beans. Lentils. Mung beans.Split peas. Dairy Milk. Cream. Cheese. Yogurt. Cottage cheese. Beverages Coffee. Black tea. Red wine. Sweets and desserts Cocoa. Chocolate. Ice cream. Seasonings and condiments Basil. Oregano. Large amounts of parsley. The items listed above may not be a complete list of  foods and beverages you should limit. Contact a dietitian for more information. Summary Iron  is a mineral that helps your body produce hemoglobin. Hemoglobin is a protein in red blood cells that carries oxygen to your body's tissues. Iron is naturally found in many foods, and many foods have iron added to them (are iron-fortified). When you eat foods that contain iron, you should eat them with foods that are high in vitamin C. Vitamin C helps your body absorb iron. Certain foods and drinks prevent your body from absorbing iron properly, such as whole grains and dairy products. You should avoid eating these foods in the same meal as iron-rich foods or with iron supplements. This information is not intended to replace advice given to you by your health care provider. Make sure you discuss any questions you have with your healthcare provider. Document Revised: 05/17/2020 Document Reviewed: 05/17/2020 Elsevier Patient Education  2022 Else[employer] Inc.   Preventive Care 62-27 Years Old, Female Preventive care refers to lifestyle choices and visits with your health care provider that can promote health and wellness. This includes: A yearly physical exam. This is also called an annual wellness visit. Regular dental and eye exams. Immunizations. Screening for certain conditions. Healthy lifestyle choices, such as: Eating a healthy diet. Getting regular exercise. Not using drugs or products that contain nicotine and tobacco. Limiting alcohol use. What can I expect for my preventive care visit? Physical exam Your health care provider will check your: Height and weight. These may be used to calculate your BMI (body mass index). BMI is a measurement that tells if you are at a healthy weight. Heart rate and blood pressure. Body temperature. Skin for abnormal spots. Counseling Your health care provider may ask you questions about your: Past medical problems. Family's medical history. Alcohol, tobacco, and drug use. Emotional well-being. Home life and relationship well-being. Sexual  activity. Diet, exercise, and sleep habits. Work and work Statistician. Access to firearms. Method of birth control. Menstrual cycle. Pregnancy history. What immunizations do I need?  Vaccines are usually given at various ages, according to a schedule. Your health care provider will recommend vaccines for you based on your age, medicalhistory, and lifestyle or other factors, such as travel or where you work. What tests do I need? Blood tests Lipid and cholesterol levels. These may be checked every 5 years, or more often if you are over 65 years old. Hepatitis C test. Hepatitis B test. Screening Lung cancer screening. You may have this screening every year starting at age 4 if you have a 30-pack-year history of smoking and currently smoke or have quit within the past 15 years. Colorectal cancer screening. All adults should have this screening starting at age 104 and continuing until age 80. Your health care provider may recommend screening at age 56 if you are at increased risk. You will have tests every 1-10 years, depending on your results and the type of screening test. Diabetes screening. This is done by checking your blood sugar (glucose) after you have not eaten for a while (fasting). You may have this done every 1-3 years. Mammogram. This may be done every 1-2 years. Talk with your health care provider about when you should start having regular mammograms. This may depend on whether you have a family history of breast cancer. BRCA-related cancer screening. This may be done if you have a family history of breast, ovarian, tubal, or peritoneal cancers. Pelvic exam and Pap test. This may be done every 3 years starting  at age 51. Starting at age 3, this may be done every 5 years if you have a Pap test in combination with an HPV test. Other tests STD (sexually transmitted disease) testing, if you are at risk. Bone density scan. This is done to screen for osteoporosis. You may have  this scan if you are at high risk for osteoporosis. Talk with your health care provider about your test results, treatment options,and if necessary, the need for more tests. Follow these instructions at home: Eating and drinking  Eat a diet that includes fresh fruits and vegetables, whole grains, lean protein, and low-fat dairy products. Take vitamin and mineral supplements as recommended by your health care provider. Do not drink alcohol if: Your health care provider tells you not to drink. You are pregnant, may be pregnant, or are planning to become pregnant. If you drink alcohol: Limit how much you have to 0-1 drink a day. Be aware of how much alcohol is in your drink. In the [place] one drink equals one 12 oz bottle of beer (355 mL), one 5 oz glass of wine (148 mL), or one 1 oz glass of hard liquor (44 mL).  Lifestyle Take daily care of your teeth and gums. Brush your teeth every morning and night with fluoride toothpaste. Floss one time each day. Stay active. Exercise for at least 30 minutes 5 or more days each week. Do not use any products that contain nicotine or tobacco, such as cigarettes, e-cigarettes, and chewing tobacco. If you need help quitting, ask your health care provider. Do not use drugs. If you are sexually active, practice safe sex. Use a condom or other form of protection to prevent STIs (sexually transmitted infections). If you do not wish to become pregnant, use a form of birth control. If you plan to become pregnant, see your health care provider for a prepregnancy visit. If told by your health care provider, take low-dose aspirin daily starting at age 48. Find healthy ways to cope with stress, such as: Meditation, yoga, or listening to music. Journaling. Talking to a trusted person. Spending time with friends and family. Safety Always wear your seat belt while driving or riding in a vehicle. Do not drive: If you have been drinking alcohol. Do not ride with  someone who has been drinking. When you are tired or distracted. While texting. Wear a helmet and other protective equipment during sports activities. If you have firearms in your house, make sure you follow all gun safety procedures. What's next? Visit your health care provider once a year for an annual wellness visit. Ask your health care provider how often you should have your eyes and teeth checked. Stay up to date on all vaccines. This information is not intended to replace advice given to you by your health care provider. Make sure you discuss any questions you have with your healthcare provider. Document Revised: 03/09/2020 Document Reviewed: 02/14/2018 Elsevier Patient Education  2022 [employer].

## 2020-12-14 NOTE — Progress Notes (Signed)
Subjective:     Marissa Duarte is a 60 y.o. female and is here for a comprehensive physical exam. The patient reports no problems.  Social History   Socioeconomic History   Marital status: Married    Spouse name: Not on file   Number of children: Not on file   Years of education: Not on file   Highest education level: Not on file  Occupational History   Not on file  Tobacco Use   Smoking status: Former    Packs/day: 0.50    Years: 20.00    Pack years: 10.00    Types: Cigarettes    Quit date: 06/19/1996    Years since quitting: 24.5   Smokeless tobacco: Never  Vaping Use   Vaping Use: Never used  Substance and Sexual Activity   Alcohol use: Yes    Alcohol/week: 6.0 standard drinks    Types: 6 Glasses of wine per week   Drug use: No   Sexual activity: Yes    Partners: Male    Birth control/protection: Surgical    Comment: BTL  Other Topics Concern   Not on file  Social History Narrative   Not on file   Social Determinants of Health   Financial Resource Strain: Not on file  Food Insecurity: Not on file  Transportation Needs: Not on file  Physical Activity: Not on file  Stress: Not on file  Social Connections: Not on file  Intimate Partner Violence: Not on file   Health Maintenance  Topic Date Due   COVID-19 Vaccine (1) Never done   HIV Screening  Never done   Zoster Vaccines- Shingrix (2 of 2) 12/22/2019   INFLUENZA VACCINE  01/17/2021   MAMMOGRAM  12/15/2021   PAP SMEAR-Modifier  06/01/2023   TETANUS/TDAP  04/19/2026   COLONOSCOPY (Pts 45-38yrs Insurance coverage will need to be confirmed)  09/29/2026   Hepatitis C Screening  Completed   Pneumococcal Vaccine 42-45 Years old  Aged Out   HPV VACCINES  Aged Out    The following portions of the patient's history were reviewed and updated as appropriate: allergies, current medications, past family history, past medical history, past social history, past surgical history, and problem list.  Review of  Systems Pertinent items noted in HPI and remainder of comprehensive ROS otherwise negative.   Objective:    BP 122/70   Pulse 96   Temp 99.5 F (37.5 C)   Ht 5\' 4"  (1.626 m)   Wt 188 lb (85.3 kg)   LMP 06/19/2012   SpO2 96%   BMI 32.27 kg/m  General appearance: alert, cooperative, and no distress Head: Normocephalic, without obvious abnormality, atraumatic Eyes: conjunctivae/corneas clear. PERRL, EOM's intact. Fundi benign. Ears: normal TM's and external ear canals both ears Nose: Nares normal. Septum midline. Mucosa normal. No drainage or sinus tenderness. Throat: lips, mucosa, and tongue normal; teeth and gums normal Neck: no adenopathy, supple, symmetrical, trachea midline, and thyroid not enlarged, symmetric, no tenderness/mass/nodules Back: symmetric, no curvature. ROM normal. No CVA tenderness. Lungs: clear to auscultation bilaterally Heart: regular rate and rhythm and S1, S2 normal Abdomen: soft, non-tender; bowel sounds normal; no masses,  no organomegaly Extremities: extremities normal, atraumatic, no cyanosis or edema Pulses: 2+ and symmetric Skin: Skin color, texture, turgor normal. No rashes or lesions Lymph nodes: Cervical adenopathy: normal and Supraclavicular adenopathy: normal Neurologic: Grossly normal    Assessment:    Healthy female exam.      Plan:  -Discussed most recent lab results  which are essentially within normal limits or stable from prior with the exception of CBC which reveals anemia. Will obtain additional labs (iron panel, retic count, Vit B12 and folate) to evaluate for potential etiology. -The 10-year ASCVD risk score Denman George DC Jr., et al., 2013) is: 4.8%   Values used to calculate the score:     Age: 59 years     Sex: Female     Is Non-Hispanic African American: No     Diabetic: No     Tobacco smoker: No     Systolic Blood Pressure: 122 mmHg     Is BP treated: Yes     HDL Cholesterol: 58 mg/dL     Total Cholesterol: 198  mg/dL -Recommend to continue weight loss efforts, follow a diet low in saturated and trans fats, continue to stay as active as possible. -BP controlled. -UTD Pap smear (followed by OB/GYN), colonoscopy, Tdap. Will obtain second dose of Shingrix today. Is schedule for mammogram in June 30th. Declined Hep C and HIV screenings. -Follow up in 6 months for reg OV: HTN, HLD   See After Visit Summary for Counseling Recommendations

## 2020-12-15 LAB — IRON,TIBC AND FERRITIN PANEL
Ferritin: 6 ng/mL — ABNORMAL LOW (ref 15–150)
Iron Saturation: 3 % — CL (ref 15–55)
Iron: 13 ug/dL — ABNORMAL LOW (ref 27–139)
Total Iron Binding Capacity: 474 ug/dL — ABNORMAL HIGH (ref 250–450)
UIBC: 461 ug/dL — ABNORMAL HIGH (ref 118–369)

## 2020-12-15 LAB — RETICULOCYTES: Retic Ct Pct: 2 % (ref 0.6–2.6)

## 2020-12-15 LAB — B12 AND FOLATE PANEL
Folate: 11.6 ng/mL (ref >3.0)
Vitamin B-12: 299 pg/mL (ref 232–1245)

## 2020-12-15 MED ORDER — IRON (FERROUS SULFATE) 325 (65 FE) MG PO TABS: 325.0000 mg | ORAL_TABLET | Freq: Every day | ORAL | 0 refills | Status: DC

## 2020-12-22 ENCOUNTER — Encounter (HOSPITAL_BASED_OUTPATIENT_CLINIC_OR_DEPARTMENT_OTHER): Payer: Self-pay | Admitting: Obstetrics & Gynecology

## 2020-12-26 ENCOUNTER — Other Ambulatory Visit: Payer: Self-pay | Admitting: Physician Assistant

## 2020-12-26 DIAGNOSIS — I10 Essential (primary) hypertension: Secondary | ICD-10-CM

## 2020-12-28 ENCOUNTER — Other Ambulatory Visit: Payer: Self-pay | Admitting: Physician Assistant

## 2020-12-28 DIAGNOSIS — I10 Essential (primary) hypertension: Secondary | ICD-10-CM

## 2020-12-31 ENCOUNTER — Encounter (HOSPITAL_BASED_OUTPATIENT_CLINIC_OR_DEPARTMENT_OTHER): Payer: Self-pay | Admitting: Obstetrics & Gynecology

## 2021-01-07 ENCOUNTER — Other Ambulatory Visit: Payer: Self-pay | Admitting: Physician Assistant

## 2021-01-07 DIAGNOSIS — D509 Iron deficiency anemia, unspecified: Secondary | ICD-10-CM

## 2021-01-07 DIAGNOSIS — D649 Anemia, unspecified: Secondary | ICD-10-CM

## 2021-01-18 ENCOUNTER — Encounter (HOSPITAL_BASED_OUTPATIENT_CLINIC_OR_DEPARTMENT_OTHER): Payer: Self-pay | Admitting: Obstetrics & Gynecology

## 2021-04-13 ENCOUNTER — Encounter (HOSPITAL_BASED_OUTPATIENT_CLINIC_OR_DEPARTMENT_OTHER): Payer: Self-pay | Admitting: *Deleted

## 2021-06-14 ENCOUNTER — Encounter: Payer: Self-pay | Admitting: Physician Assistant

## 2021-06-14 ENCOUNTER — Ambulatory Visit (INDEPENDENT_AMBULATORY_CARE_PROVIDER_SITE_OTHER): Payer: 59 | Admitting: Physician Assistant

## 2021-06-14 ENCOUNTER — Other Ambulatory Visit: Payer: Self-pay

## 2021-06-14 VITALS — BP 135/81 | HR 86 | Temp 97.6°F | Ht 69.0 in | Wt 195.0 lb

## 2021-06-14 DIAGNOSIS — E7800 Pure hypercholesterolemia, unspecified: Secondary | ICD-10-CM | POA: Diagnosis not present

## 2021-06-14 DIAGNOSIS — Z23 Encounter for immunization: Secondary | ICD-10-CM

## 2021-06-14 DIAGNOSIS — I10 Essential (primary) hypertension: Secondary | ICD-10-CM

## 2021-06-14 DIAGNOSIS — R300 Dysuria: Secondary | ICD-10-CM | POA: Diagnosis not present

## 2021-06-14 DIAGNOSIS — D649 Anemia, unspecified: Secondary | ICD-10-CM | POA: Diagnosis not present

## 2021-06-14 NOTE — Patient Instructions (Signed)
Iron-Rich Diet  Iron is a mineral that helps your body produce hemoglobin. Hemoglobin is a protein in red blood cells that carries oxygen to your body's tissues. Eating too little iron may cause you to feel weak and tired, and it can increase your risk of infection. Iron is naturally found in many foods, and many foods have iron added to them (are iron-fortified). You may need to follow an iron-rich diet if you do not have enough iron in your body due to certain medical conditions. The amount of iron that you need each day depends on your age, your sex, and any medical conditions you have. Follow instructions from your health care provider or a dietitian about how much ironyou should eat each day. What are tips for following this plan? Reading food labels Check food labels to see how many milligrams (mg) of iron are in each serving. Cooking Cook foods in pots and pans that are made from iron. Take these steps to make it easier for your body to absorb iron from certain foods: Soak beans overnight before cooking. Soak whole grains overnight and drain them before using. Ferment flours before baking, such as by using yeast in bread dough. Meal planning When you eat foods that contain iron, you should eat them with foods that are high in vitamin C. These include oranges, peppers, tomatoes, potatoes, and mangoes. Vitamin C helps your body absorb iron. Certain foods and drinks prevent your body from absorbing iron properly. Avoid eating these foods in the same meal as iron-rich foods or with iron supplements. These foods include: Coffee, black tea, and red wine. Milk, dairy products, and foods that are high in calcium. Beans and soybeans. Whole grains. General information Take iron supplements only as told by your health care provider. An overdose of iron can be life-threatening. If you were prescribed iron supplements, take them with orange juice or a vitamin C supplement. When you eat iron-fortified  foods or take an iron supplement, you should also eat foods that naturally contain iron, such as meat, poultry, and fish. Eating naturally iron-rich foods helps your body absorb the iron that is added to other foods or contained in a supplement. Iron from animal sources is better absorbed than iron from plant sources. What foods should I eat? Fruits Prunes. Raisins. Eat fruits high in vitamin C, such as oranges, grapefruits, and strawberries,with iron-rich foods. Vegetables Spinach (cooked). Green peas. Broccoli. Fermented vegetables. Eat vegetables high in vitamin C, such as leafy greens, potatoes, bell peppers,and tomatoes, with iron-rich foods. Grains Iron-fortified breakfast cereal. Iron-fortified whole-wheat bread. Enrichedrice. Sprouted grains. Meats and other proteins Beef liver. Beef. Turkey. Chicken. Oysters. Shrimp. Tuna. Sardines. Chickpeas.Nuts. Tofu. Pumpkin seeds. Beverages Tomato juice. Fresh orange juice. Prune juice. Hibiscus tea. Iron-fortifiedinstant breakfast shakes. Sweets and desserts Blackstrap molasses. Seasonings and condiments Tahini. Fermented soy sauce. Other foods Wheat germ. The items listed above may not be a complete list of recommended foods and beverages. Contact a dietitian for more information. What foods should I limit? These are foods that should be limited while eating iron-rich foods as they canreduce the absorption of iron in your body. Grains Whole grains. Bran cereal. Bran flour. Meats and other proteins Soybeans. Products made from soy protein. Black beans. Lentils. Mung beans.Split peas. Dairy Milk. Cream. Cheese. Yogurt. Cottage cheese. Beverages Coffee. Black tea. Red wine. Sweets and desserts Cocoa. Chocolate. Ice cream. Seasonings and condiments Basil. Oregano. Large amounts of parsley. The items listed above may not be a complete list of   foods and beverages you should limit. Contact a dietitian for more information. Summary Iron  is a mineral that helps your body produce hemoglobin. Hemoglobin is a protein in red blood cells that carries oxygen to your body's tissues. Iron is naturally found in many foods, and many foods have iron added to them (are iron-fortified). When you eat foods that contain iron, you should eat them with foods that are high in vitamin C. Vitamin C helps your body absorb iron. Certain foods and drinks prevent your body from absorbing iron properly, such as whole grains and dairy products. You should avoid eating these foods in the same meal as iron-rich foods or with iron supplements. This information is not intended to replace advice given to you by your health care provider. Make sure you discuss any questions you have with your healthcare provider. Document Revised: 05/17/2020 Document Reviewed: 05/17/2020 Elsevier Patient Education  2022 [employer] Inc.  

## 2021-06-14 NOTE — Assessment & Plan Note (Signed)
-  Patient completed oral iron therapy. Discussed adequate dietary iron, provided handout. -Will repeat CBC and iron panel with CPE.

## 2021-06-14 NOTE — Assessment & Plan Note (Signed)
-  Last lipid panel, LDL 126 which has gradually improved from prior. The 10-year ASCVD risk score (Arnett DK, et al., 2019) is: 5.8% -Patient denies family hx of early CAD. Recommend to continue with a heart healthy diet low in fat and exercise regimen. -Will continue to monitor.

## 2021-06-14 NOTE — Progress Notes (Signed)
Established Patient Office Visit  Subjective:  Patient ID: Marissa Duarte, female    DOB: 06-07-1961  Age: 60 y.o. MRN: [id number]  CC:  Chief Complaint  Patient presents with   Follow-up   Hypertension   Hyperlipidemia    HPI Marissa Duarte presents for follow up on hypertension and hyperlipidemia. Patient report was having urinary symptoms  few days ago and started taking cranberry which helped with symptoms. Patient completed iron supplement and reports was able to donate blood last week and her hemoglobin was 14.0.  HTN: Pt denies chest pain, palpitations, dizziness or lower extremity swelling. Taking medication as directed without side effects. Patient has not checked blood pressure at home lately. Pt continues to monitor sodium intake.  HLD: Pt trying to manage with diet. Reports before the Holidays started she started using her stationary bike 20 minutes 4 times per week.    Past Medical History:  Diagnosis Date   Acid reflux    Esophagitis 3/11    noted on EGD   Hypertension    Kidney stone    Shingles 2021    Past Surgical History:  Procedure Laterality Date   BREAST LUMPECTOMY  age 78   fibroadenoma   COLONOSCOPY     TUBAL LIGATION  1990   UPPER GASTROINTESTINAL ENDOSCOPY     WISDOM TOOTH EXTRACTION      Family History  Problem Relation Age of Onset   Hypertension Father    Polycystic kidney disease Father    Cancer Father        lung   Hypertension Mother    Infertility Sister    Hypertension Sister    Polycystic kidney disease Sister    Healthy Brother    Heart attack Maternal Uncle    Diabetes Maternal Grandfather    Colon cancer Neg Hx    Esophageal cancer Neg Hx    Rectal cancer Neg Hx    Stomach cancer Neg Hx     Social History   Socioeconomic History   Marital status: Married    Spouse name: Not on file   Number of children: Not on file   Years of education: Not on file   Highest education level: Not on file  Occupational History    Not on file  Tobacco Use   Smoking status: Former    Packs/day: 0.50    Years: 20.00    Pack years: 10.00    Types: Cigarettes    Quit date: 06/19/1996    Years since quitting: 25.0   Smokeless tobacco: Never  Vaping Use   Vaping Use: Never used  Substance and Sexual Activity   Alcohol use: Yes    Alcohol/week: 6.0 standard drinks    Types: 6 Glasses of wine per week   Drug use: No   Sexual activity: Yes    Partners: Male    Birth control/protection: Surgical    Comment: BTL  Other Topics Concern   Not on file  Social History Narrative   Not on file   Social Determinants of Health   Financial Resource Strain: Not on file  Food Insecurity: Not on file  Transportation Needs: Not on file  Physical Activity: Not on file  Stress: Not on file  Social Connections: Not on file  Intimate Partner Violence: Not on file    Outpatient Medications Prior to Visit  Medication Sig Dispense Refill   Ascorbic Acid (VITAMIN C) 1000 MG tablet Take 1,000 mg by mouth daily.  Esomeprazole Magnesium (NEXIUM PO) Take by mouth. OTC- every other day     Omega-3 Fatty Acids (FISH OIL PO) Take by mouth daily.     Turmeric (QC TUMERIC COMPLEX PO) Take by mouth.     valsartan-hydrochlorothiazide (DIOVAN-HCT) 160-25 MG tablet TAKE 1/2 TABLET BY MOUTH EVERY DAY 45 tablet 1   Iron, Ferrous Sulfate, 325 (65 Fe) MG TABS Take 325 mg by mouth daily. 90 tablet 0   No facility-administered medications prior to visit.    No Known Allergies  ROS Review of Systems Review of Systems:  A fourteen system review of systems was performed and found to be positive as per HPI.   Objective:    Physical Exam General:  Well Developed, well nourished, appropriate for stated age.  Neuro:  Alert and oriented,  extra-ocular muscles intact  HEENT:  Normocephalic, atraumatic, neck supple  Skin:  no gross rash, warm, pink. Cardiac:  RRR, S1 S2 Respiratory:  CTA B/L Vascular:  Ext warm, no cyanosis apprec.;  cap RF less 2 sec. Psych:  No HI/SI, judgement and insight good, Euthymic mood. Full Affect.  BP 135/81    Pulse 86    Temp 97.6 F (36.4 C)    Ht 5' 9"  (1.753 m)    Wt 195 lb (88.5 kg)    LMP 06/19/2012    SpO2 98%    BMI 28.80 kg/m  Wt Readings from Last 3 Encounters:  06/14/21 195 lb (88.5 kg)  12/14/20 188 lb (85.3 kg)  05/31/20 200 lb (90.7 kg)     Health Maintenance Due  Topic Date Due   COVID-19 Vaccine (1) Never done   HIV Screening  Never done    There are no preventive care reminders to display for this patient.  Lab Results  Component Value Date   TSH 1.550 12/06/2020   Lab Results  Component Value Date   WBC 4.8 12/06/2020   HGB 9.7 (L) 12/06/2020   HCT 32.1 (L) 12/06/2020   MCV 79 12/06/2020   PLT 304 12/06/2020   Lab Results  Component Value Date   NA 141 12/06/2020   K 4.5 12/06/2020   CO2 25 12/06/2020   GLUCOSE 94 12/06/2020   BUN 14 12/06/2020   CREATININE 0.82 12/06/2020   BILITOT 0.4 12/06/2020   ALKPHOS 66 12/06/2020   AST 16 12/06/2020   ALT 15 12/06/2020   PROT 6.2 12/06/2020   ALBUMIN 4.4 12/06/2020   CALCIUM 9.0 12/06/2020   EGFR 81 12/06/2020   Lab Results  Component Value Date   CHOL 198 12/06/2020   Lab Results  Component Value Date   HDL 58 12/06/2020   Lab Results  Component Value Date   LDLCALC 126 (H) 12/06/2020   Lab Results  Component Value Date   TRIG 79 12/06/2020   Lab Results  Component Value Date   CHOLHDL 3.4 12/06/2020   Lab Results  Component Value Date   HGBA1C 5.6 12/06/2020      Assessment & Plan:   Problem List Items Addressed This Visit       Cardiovascular and Mediastinum   Essential hypertension, benign - Primary    -Stable. -Continue current medication regimen. -Will collect CMP with CPE. -Continue low sodium diet. -Will continue to monitor.        Other   Anemia    -Patient completed oral iron therapy. Discussed adequate dietary iron, provided handout. -Will repeat CBC and  iron panel with CPE.  Elevated LDL cholesterol level    -Last lipid panel, LDL 126 which has gradually improved from prior. The 10-year ASCVD risk score (Arnett DK, et al., 2019) is: 5.8% -Patient denies family hx of early CAD. Recommend to continue with a heart healthy diet low in fat and exercise regimen. -Will continue to monitor.      Other Visit Diagnoses     Need for influenza vaccination       Relevant Orders   Flu Vaccine QUAD 42moIM (Fluarix, Fluzone & Alfiuria Quad PF) (Completed)   Dysuria          Dysuria: -UA collected, unremarkable. Reassurance provided no signs of UTI present and pt currently asx.  No orders of the defined types were placed in this encounter.   Follow-up: Return in about 6 months (around 12/13/2021) for CPE and FBW include iron panel.    MLorrene Reid PA-C

## 2021-06-14 NOTE — Assessment & Plan Note (Signed)
-  Stable. -Continue current medication regimen. -Will collect CMP with CPE. -Continue low sodium diet. -Will continue to monitor.

## 2021-06-15 LAB — POCT URINALYSIS DIPSTICK
Bilirubin, UA: NEGATIVE
Blood, UA: NEGATIVE
Glucose, UA: NEGATIVE
Ketones, UA: NEGATIVE
Leukocytes, UA: NEGATIVE
Nitrite, UA: NEGATIVE
Protein, UA: NEGATIVE
Spec Grav, UA: 1.015 (ref 1.010–1.025)
Urobilinogen, UA: 0.2 E.U./dL
pH, UA: 5.5 (ref 5.0–8.0)

## 2021-06-15 NOTE — Addendum Note (Signed)
Addended by: Lupita Leash on: 06/15/2021 07:53 AM   Modules accepted: Orders

## 2021-06-16 ENCOUNTER — Ambulatory Visit: Payer: 59 | Admitting: Physician Assistant

## 2021-08-05 ENCOUNTER — Ambulatory Visit (INDEPENDENT_AMBULATORY_CARE_PROVIDER_SITE_OTHER): Payer: 59 | Admitting: Obstetrics & Gynecology

## 2021-08-05 ENCOUNTER — Other Ambulatory Visit: Payer: Self-pay

## 2021-08-05 ENCOUNTER — Encounter (HOSPITAL_BASED_OUTPATIENT_CLINIC_OR_DEPARTMENT_OTHER): Payer: Self-pay | Admitting: Obstetrics & Gynecology

## 2021-08-05 VITALS — BP 140/88 | HR 88 | Ht 64.0 in | Wt 199.4 lb

## 2021-08-05 DIAGNOSIS — K921 Melena: Secondary | ICD-10-CM

## 2021-08-05 DIAGNOSIS — Z862 Personal history of diseases of the blood and blood-forming organs and certain disorders involving the immune mechanism: Secondary | ICD-10-CM | POA: Diagnosis not present

## 2021-08-05 DIAGNOSIS — I10 Essential (primary) hypertension: Secondary | ICD-10-CM | POA: Diagnosis not present

## 2021-08-05 DIAGNOSIS — Z780 Asymptomatic menopausal state: Secondary | ICD-10-CM | POA: Diagnosis not present

## 2021-08-05 DIAGNOSIS — Z01419 Encounter for gynecological examination (general) (routine) without abnormal findings: Secondary | ICD-10-CM | POA: Diagnosis not present

## 2021-08-05 LAB — HEMOCCULT GUIAC POC 1CARD (OFFICE): Fecal Occult Blood, POC: POSITIVE — AB

## 2021-08-05 NOTE — Progress Notes (Signed)
61 y.o. G2P2 Married White or Caucasian female here for annual exam.  H/o anemia last year.  She started iron and this improved her hemoglobin.      Patient's last menstrual period was 06/19/2012.          Sexually active: No.  The current method of family planning is post menopausal status.    Exercising: walking 3 to 4 times a week Smoker:  no  Health Maintenance: Pap:  05/31/2020 Negative with neg HR HPV History of abnormal Pap:  no MMG:  12/16/2020 Needed additional images.  Follow up was normal.   Colonoscopy:  09/28/2016, follow up 10 years BMD:   recommended Screening Labs: done 11/2020   reports that she quit smoking about 25 years ago. Her smoking use included cigarettes. She has a 10.00 pack-year smoking history. She has never used smokeless tobacco. She reports current alcohol use of about 6.0 standard drinks per week. She reports that she does not use drugs.  Past Medical History:  Diagnosis Date   Acid reflux    Esophagitis 3/11    noted on EGD   Hypertension    Kidney stone    Shingles 2021    Past Surgical History:  Procedure Laterality Date   BREAST LUMPECTOMY  age 7   fibroadenoma   COLONOSCOPY     TUBAL LIGATION  1990   UPPER GASTROINTESTINAL ENDOSCOPY     WISDOM TOOTH EXTRACTION      Current Outpatient Medications  Medication Sig Dispense Refill   Ascorbic Acid (VITAMIN C) 1000 MG tablet Take 1,000 mg by mouth daily.     Esomeprazole Magnesium (NEXIUM PO) Take by mouth. OTC- every other day     Omega-3 Fatty Acids (FISH OIL PO) Take by mouth daily.     Turmeric (QC TUMERIC COMPLEX PO) Take by mouth.     valsartan-hydrochlorothiazide (DIOVAN-HCT) 160-25 MG tablet TAKE 1/2 TABLET BY MOUTH EVERY DAY 45 tablet 1   No current facility-administered medications for this visit.    Family History  Problem Relation Age of Onset   Hypertension Father    Polycystic kidney disease Father    Cancer Father        lung   Hypertension Mother    Infertility  Sister    Hypertension Sister    Polycystic kidney disease Sister    Healthy Brother    Heart attack Maternal Uncle    Diabetes Maternal Grandfather    Colon cancer Neg Hx    Esophageal cancer Neg Hx    Rectal cancer Neg Hx    Stomach cancer Neg Hx     Review of Systems  Respiratory:  Positive for shortness of breath (occasional).    Exam:   BP 140/88 (BP Location: Right Arm, Patient Position: Sitting, Cuff Size: Large)    Pulse 88    Ht 5\' 4"  (1.626 m) Comment: reported   Wt 199 lb 6.4 oz (90.4 kg)    LMP 06/19/2012    BMI 34.23 kg/m   Height: 5\' 4"  (162.6 cm) (reported)  General appearance: alert, cooperative and appears stated age Head: Normocephalic, without obvious abnormality, atraumatic Neck: no adenopathy, supple, symmetrical, trachea midline and thyroid normal to inspection and palpation Lungs: clear to auscultation bilaterally Breasts: normal appearance, no masses or tenderness Heart: regular rate and rhythm Abdomen: soft, non-tender; bowel sounds normal; no masses,  no organomegaly Extremities: extremities normal, atraumatic, no cyanosis or edema Skin: Skin color, texture, turgor normal. No rashes or lesions Lymph nodes:  Cervical, supraclavicular, and axillary nodes normal. No abnormal inguinal nodes palpated Neurologic: Grossly normal   Pelvic: External genitalia:  no lesions              Urethra:  normal appearing urethra with no masses, tenderness or lesions              Bartholins and Skenes: normal                 Vagina: normal appearing vagina with normal color and no discharge, no lesions              Cervix: no lesions              Pap taken: No. Bimanual Exam:  Uterus:  normal size, contour, position, consistency, mobility, non-tender              Adnexa: normal adnexa and no mass, fullness, tenderness               Rectovaginal: Confirms               Anus:  normal sphincter tone, no lesions  Chaperone, Octaviano Batty, CMA, was present for  exam.  Assessment/Plan: 1. Well woman exam with routine gynecological exam - pap smear neg with neg HR HPV 05/2020 - mmg 11/2020 - colonoscopy 2018 - plan BMD with MMG - vaccines reviewed/updated - lab work done with [employer]  2. Postmenopausal - no HRT  3. Essential hypertension, benign - on Diovan  4. History of anemia - POCT Occult Blood Stool positive today.  Needs GI referral, order placed. - will need lab work but phlebotomy not available today

## 2021-08-09 ENCOUNTER — Telehealth (HOSPITAL_BASED_OUTPATIENT_CLINIC_OR_DEPARTMENT_OTHER): Payer: Self-pay | Admitting: Obstetrics & Gynecology

## 2021-08-09 NOTE — Telephone Encounter (Signed)
Patient called would like for the someone to give her a call regarding labs .

## 2021-08-10 NOTE — Telephone Encounter (Signed)
Called patient and she states that she called her PCP about drawing labs that were discussed with Dr. Hyacinth Meeker at last office visit. Patient states office states they didn't know anything about drawing labs.  Per Dr. Hyacinth Meeker she spoke with NP on Friday and was told that the office would contact patient to schedule appointment. Hyacinth Meeker will send a message to NP again. tbw

## 2021-08-12 ENCOUNTER — Encounter: Payer: Self-pay | Admitting: Physician Assistant

## 2021-08-12 DIAGNOSIS — D509 Iron deficiency anemia, unspecified: Secondary | ICD-10-CM

## 2021-08-12 DIAGNOSIS — D649 Anemia, unspecified: Secondary | ICD-10-CM

## 2021-08-18 ENCOUNTER — Other Ambulatory Visit: Payer: Self-pay

## 2021-08-18 ENCOUNTER — Other Ambulatory Visit: Payer: 59

## 2021-08-18 DIAGNOSIS — Z0000 Encounter for general adult medical examination without abnormal findings: Secondary | ICD-10-CM

## 2021-08-18 DIAGNOSIS — D509 Iron deficiency anemia, unspecified: Secondary | ICD-10-CM

## 2021-08-18 DIAGNOSIS — D649 Anemia, unspecified: Secondary | ICD-10-CM

## 2021-08-19 LAB — LIPID PANEL
Chol/HDL Ratio: 3.8 ratio (ref 0.0–4.4)
Cholesterol, Total: 195 mg/dL (ref 100–199)
HDL: 52 mg/dL (ref >39)
LDL Chol Calc (NIH): 123 mg/dL — ABNORMAL HIGH (ref 0–99)
Triglycerides: 111 mg/dL (ref 0–149)
VLDL Cholesterol Cal: 20 mg/dL (ref 5–40)

## 2021-08-19 LAB — COMPREHENSIVE METABOLIC PANEL
ALT: 14 IU/L (ref 0–32)
AST: 16 IU/L (ref 0–40)
Albumin/Globulin Ratio: 2.1 (ref 1.2–2.2)
Albumin: 4 g/dL (ref 3.8–4.8)
Alkaline Phosphatase: 66 IU/L (ref 44–121)
BUN/Creatinine Ratio: 17 (ref 12–28)
BUN: 13 mg/dL (ref 8–27)
Bilirubin Total: 0.4 mg/dL (ref 0.0–1.2)
CO2: 24 mmol/L (ref 20–29)
Calcium: 8.7 mg/dL (ref 8.7–10.3)
Chloride: 102 mmol/L (ref 96–106)
Creatinine, Ser: 0.76 mg/dL (ref 0.57–1.00)
Globulin, Total: 1.9 g/dL (ref 1.5–4.5)
Glucose: 93 mg/dL (ref 70–99)
Potassium: 4.4 mmol/L (ref 3.5–5.2)
Sodium: 147 mmol/L — ABNORMAL HIGH (ref 134–144)
Total Protein: 5.9 g/dL — ABNORMAL LOW (ref 6.0–8.5)
eGFR: 89 mL/min/{1.73_m2} (ref >59)

## 2021-08-19 LAB — CBC WITH DIFFERENTIAL/PLATELET
Basophils Absolute: 0 10*3/uL (ref 0.0–0.2)
Basos: 1 %
EOS (ABSOLUTE): 0.3 10*3/uL (ref 0.0–0.4)
Eos: 7 %
Hematocrit: 33.1 % — ABNORMAL LOW (ref 34.0–46.6)
Hemoglobin: 10.5 g/dL — ABNORMAL LOW (ref 11.1–15.9)
Immature Grans (Abs): 0 10*3/uL (ref 0.0–0.1)
Immature Granulocytes: 0 %
Lymphocytes Absolute: 1.3 10*3/uL (ref 0.7–3.1)
Lymphs: 30 %
MCH: 27.9 pg (ref 26.6–33.0)
MCHC: 31.7 g/dL (ref 31.5–35.7)
MCV: 88 fL (ref 79–97)
Monocytes Absolute: 0.4 10*3/uL (ref 0.1–0.9)
Monocytes: 9 %
Neutrophils Absolute: 2.2 10*3/uL (ref 1.4–7.0)
Neutrophils: 53 %
Platelets: 290 10*3/uL (ref 150–450)
RBC: 3.76 x10E6/uL — ABNORMAL LOW (ref 3.77–5.28)
RDW: 12.6 % (ref 11.7–15.4)
WBC: 4.2 10*3/uL (ref 3.4–10.8)

## 2021-08-19 LAB — IRON,TIBC AND FERRITIN PANEL
Ferritin: 7 ng/mL — ABNORMAL LOW (ref 15–150)
Iron Saturation: 4 % — CL (ref 15–55)
Iron: 15 ug/dL — ABNORMAL LOW (ref 27–139)
Total Iron Binding Capacity: 399 ug/dL (ref 250–450)
UIBC: 384 ug/dL — ABNORMAL HIGH (ref 118–369)

## 2021-08-19 LAB — TSH: TSH: 2 u[IU]/mL (ref 0.450–4.500)

## 2021-08-19 LAB — FOLATE: Folate: 13.2 ng/mL (ref >3.0)

## 2021-08-19 LAB — HEMOGLOBIN A1C
Est. average glucose Bld gHb Est-mCnc: 103 mg/dL
Hgb A1c MFr Bld: 5.2 % (ref 4.8–5.6)

## 2021-08-19 LAB — VITAMIN B12: Vitamin B-12: 318 pg/mL (ref 232–1245)

## 2021-08-22 ENCOUNTER — Other Ambulatory Visit: Payer: Self-pay | Admitting: Physician Assistant

## 2021-08-22 DIAGNOSIS — D649 Anemia, unspecified: Secondary | ICD-10-CM

## 2021-08-22 MED ORDER — IRON (FERROUS SULFATE) 325 (65 FE) MG PO TABS: 325.0000 mg | ORAL_TABLET | Freq: Every day | ORAL | 0 refills | Status: DC

## 2021-09-09 ENCOUNTER — Encounter: Payer: Self-pay | Admitting: Nurse Practitioner

## 2021-09-09 ENCOUNTER — Ambulatory Visit (INDEPENDENT_AMBULATORY_CARE_PROVIDER_SITE_OTHER): Payer: 59 | Admitting: Nurse Practitioner

## 2021-09-09 VITALS — BP 128/76 | HR 104 | Ht 64.0 in | Wt 196.6 lb

## 2021-09-09 DIAGNOSIS — D509 Iron deficiency anemia, unspecified: Secondary | ICD-10-CM

## 2021-09-09 MED ORDER — PLENVU 140 G PO SOLR: ORAL | 0 refills | Status: DC

## 2021-09-09 NOTE — Patient Instructions (Addendum)
If you are age 63 or older, your body mass index should be between 23-30. Your Body mass index is 33.75 kg/m?Marland Kitchen If this is out of the aforementioned range listed, please consider follow up with your Primary Care Provider. ? ?If you are age 44 or younger, your body mass index should be between 19-25. Your Body mass index is 33.75 kg/m?Marland Kitchen If this is out of the aformentioned range listed, please consider follow up with your Primary Care Provider.  ? ?You have been scheduled for an endoscopy and colonoscopy. Please follow the written instructions given to you at your visit today. ?Please pick up your prep supplies at the pharmacy within the next 1-3 days. ?If you use inhalers (even only as needed), please bring them with you on the day of your procedure.  ? ?Hold iron tablets for 10 days prior to colonoscopy ? ? ?The [facility] providers would like to encourage you to use [facility] to communicate with providers for non-urgent requests or questions.  Due to long hold times on the telephone, sending your provider a message by [facility] may be a faster and more efficient way to get a response.  Please allow 48 business hours for a response.  Please remember that this is for non-urgent requests.  ? ?It was a pleasure to see you today! ? ?Thank you for trusting me with your gastrointestinal care!   ? ?Willette Cluster, NP  ? ? ? ? ? ? ?

## 2021-09-09 NOTE — Progress Notes (Signed)
? ? ?Assessment :   ? ?New iron deficiency anemia, heme positive stool  - No GI symptoms. Possible causes of anemia not limited to erosive disease / PUD / AVMs / celiac disease / neoplasm. A possible cause of anemia is her donation of blood 3-4 times a year .  Normal screening colonoscopy in 2018.  ?GERD, no heartburn on Nexium qod but has recently developed a dry cough.  ? ?Plan:    ? ?Schedule for a EGD and colonoscopy for evaluation of IDA. The risks and benefits of colonoscopy with possible polypectomy / biopsies were discussed and the patient agrees to proceed.  ?Hold iron for 10 days prior to colonoscopy  ? ? ?History of Present Illness:  ? ?Patient profile:  ?Marissa Duarte is a 61 y.o. female known to Dr. Loletha Carrow with a past medical history significant for GERD, HTN, kidney stones. See PMH below for any additional history. ? ?Referred by Hale Bogus, MD for FOBT+ ? ?Chief complaint:  ? ?Patient had a normal screening colonoscopy April 2018, not seen here since ? ?In June 2022 her hgb was 9.7 ( down from 17.7 the year prior), ferritin 4. Prescribed iron for 3 months. Went to donate blood afterwards and hgb was up to 14.  Recently started getting tired and having chest palpitation . Saw GYN,  labs done on 08/18/21 and hgb was 10.5, ferritin 7. She was heme positive. She hasn't seen any blood stool. Having black stool after restarting iron two weeks ago but no black stools prior to that. No NSAID use. No bowel changes. No nausea / vomiting or abdominal pain. No hematuria or vaginal bleeding. Weight is stable. She donates blood 3-4 times a year. She has dry cough but no heartburn ( as long as she takes Nexium QOD)  ? ? ?Previous Labs / Imaging:: ? ?  Latest Ref Rng & Units 08/18/2021  ?  9:03 AM 12/06/2020  ?  8:45 AM 10/24/2019  ?  9:26 AM  ?CBC  ?WBC 3.4 - 10.8 x10E3/uL 4.2   4.8   4.3    ?Hemoglobin 11.1 - 15.9 g/dL 10.5   9.7   13.7    ?Hematocrit 34.0 - 46.6 % 33.1   32.1   41.7    ?Platelets 150 - 450 x10E3/uL 290    304   212    ? ? ?No results found for: LIPASE ? ?  Latest Ref Rng & Units 08/18/2021  ?  9:03 AM 12/06/2020  ?  8:45 AM 10/24/2019  ?  9:26 AM  ?CMP  ?Glucose 70 - 99 mg/dL 93   94   91    ?BUN 8 - 27 mg/dL 13   14   16     ?Creatinine 0.57 - 1.00 mg/dL 0.76   0.82   0.80    ?Sodium 134 - 144 mmol/L 147   141   143    ?Potassium 3.5 - 5.2 mmol/L 4.4   4.5   4.2    ?Chloride 96 - 106 mmol/L 102   104   103    ?CO2 20 - 29 mmol/L 24   25   28     ?Calcium 8.7 - 10.3 mg/dL 8.7   9.0   9.4    ?Total Protein 6.0 - 8.5 g/dL 5.9   6.2   6.5    ?Total Bilirubin 0.0 - 1.2 mg/dL 0.4   0.4   0.5    ?Alkaline Phos 44 - 121 IU/L  66   66   71    ?AST 0 - 40 IU/L 16   16   22     ?ALT 0 - 32 IU/L 14   15   19     ? ? ?Previous GI Evaluations;   ? ?Endoscopies: ?April 2018 screening colonoscopy  ?--Excellent prep ?--Normal.  ? ? ?Past Medical History:  ?Diagnosis Date  ? Acid reflux   ? Esophagitis 3/11   ? noted on EGD  ? Hypertension   ? Kidney stone   ? Shingles 2021  ? ?Past Surgical History:  ?Procedure Laterality Date  ? BREAST LUMPECTOMY Left age 31  ? fibroadenoma  ? COLONOSCOPY    ? TUBAL LIGATION  1990  ? UPPER GASTROINTESTINAL ENDOSCOPY    ? WISDOM TOOTH EXTRACTION    ? ?Family History  ?Problem Relation Age of Onset  ? Hypertension Mother   ? Thyroid disease Mother   ? Hypertension Father   ? Polycystic kidney disease Father   ? Cancer Father   ?     lung  ? Infertility Sister   ? Hypertension Sister   ? Polycystic kidney disease Sister   ? Healthy Brother   ? Diabetes Maternal Grandfather   ? Heart attack Maternal Uncle   ? Colon cancer Neg Hx   ? Esophageal cancer Neg Hx   ? Rectal cancer Neg Hx   ? Stomach cancer Neg Hx   ? Colon polyps Neg Hx   ? ?Social History  ? ?Tobacco Use  ? Smoking status: Former  ?  Packs/day: 0.50  ?  Years: 20.00  ?  Pack years: 10.00  ?  Types: Cigarettes  ?  Quit date: 06/19/1996  ?  Years since quitting: 25.2  ? Smokeless tobacco: Never  ?Vaping Use  ? Vaping Use: Never used  ?Substance Use  Topics  ? Alcohol use: Yes  ?  Alcohol/week: 6.0 standard drinks  ?  Types: 6 Glasses of wine per week  ?  Comment: occasionally  ? Drug use: No  ? ?Current Outpatient Medications  ?Medication Sig Dispense Refill  ? Ascorbic Acid (VITAMIN C) 1000 MG tablet Take 1,000 mg by mouth daily.    ? Esomeprazole Magnesium (NEXIUM PO) Take by mouth. OTC- every other day    ? Iron, Ferrous Sulfate, 325 (65 Fe) MG TABS Take 325 mg by mouth daily. 30 tablet 0  ? Omega-3 Fatty Acids (FISH OIL PO) Take by mouth daily.    ? Turmeric (QC TUMERIC COMPLEX PO) Take by mouth.    ? valsartan-hydrochlorothiazide (DIOVAN-HCT) 160-25 MG tablet TAKE 1/2 TABLET BY MOUTH EVERY DAY 45 tablet 1  ? ?No current facility-administered medications for this visit.  ? ?No Known Allergies ? ? ?Review of Systems: ?Positive for dry cough, shortness of breath with activity.  All other systems reviewed and negative except where noted in HPI.  ? ?Physical Exam:   ? ?Wt Readings from Last 3 Encounters:  ?09/09/21 196 lb 9.6 oz (89.2 kg)  ?08/05/21 199 lb 6.4 oz (90.4 kg)  ?06/14/21 195 lb (88.5 kg)  ? ? ?BP 128/76   Pulse (!) 104   Ht 5\' 4"  (1.626 m)   Wt 196 lb 9.6 oz (89.2 kg)   LMP 06/19/2012   SpO2 96%   BMI 33.75 kg/m?  ?Constitutional:  Generally well appearing female in no acute distress. ?Psychiatric: Pleasant. Normal mood and affect. Behavior is normal. ?EENT: Pupils normal.  Conjunctivae are normal. No scleral icterus. ?Neck  supple.  ?Cardiovascular: Normal rate, regular rhythm. No edema ?Pulmonary/chest: Effort normal and breath sounds normal. No wheezing, rales or rhonchi. ?Abdominal: Soft, nondistended, nontender. Bowel sounds active throughout. There are no masses palpable. No hepatomegaly. ?Neurological: Alert and oriented to person place and time. ?Skin: Skin is warm and dry. No rashes noted. ? ?Tye Savoy, NP  09/09/2021, 10:23 AM ? ?Cc:  ?Referring Provider ?Hale Bogus, MD ? ? ? ? ? ? ? ?

## 2021-09-13 NOTE — Progress Notes (Signed)
____________________________________________________________ ? ?Attending physician addendum: ? ?Thank you for sending this case to me. ?I have reviewed the entire note and agree with the plan. ? ?5-day hold of iron before the procedure is a sufficient ?Also, I will place a clinical reminder a week prior to her procedure for her to get a CBC and make sure her hemoglobin is acceptable for the procedures. ? ?Amada Jupiter, MD ? ?____________________________________________________________ ? ?

## 2021-09-14 ENCOUNTER — Other Ambulatory Visit: Payer: Self-pay | Admitting: Physician Assistant

## 2021-09-14 DIAGNOSIS — D649 Anemia, unspecified: Secondary | ICD-10-CM

## 2021-09-15 ENCOUNTER — Other Ambulatory Visit: Payer: Self-pay

## 2021-09-15 DIAGNOSIS — I10 Essential (primary) hypertension: Secondary | ICD-10-CM

## 2021-09-15 DIAGNOSIS — D509 Iron deficiency anemia, unspecified: Secondary | ICD-10-CM

## 2021-09-16 ENCOUNTER — Other Ambulatory Visit: Payer: 59

## 2021-09-16 ENCOUNTER — Other Ambulatory Visit: Payer: Self-pay

## 2021-09-16 DIAGNOSIS — I10 Essential (primary) hypertension: Secondary | ICD-10-CM

## 2021-09-16 DIAGNOSIS — D509 Iron deficiency anemia, unspecified: Secondary | ICD-10-CM

## 2021-09-17 LAB — COMPREHENSIVE METABOLIC PANEL
ALT: 19 IU/L (ref 0–32)
AST: 27 IU/L (ref 0–40)
Albumin/Globulin Ratio: 1.8 (ref 1.2–2.2)
Albumin: 4.2 g/dL (ref 3.8–4.8)
Alkaline Phosphatase: 67 IU/L (ref 44–121)
BUN/Creatinine Ratio: 12 (ref 12–28)
BUN: 10 mg/dL (ref 8–27)
Bilirubin Total: 0.4 mg/dL (ref 0.0–1.2)
CO2: 24 mmol/L (ref 20–29)
Calcium: 9.1 mg/dL (ref 8.7–10.3)
Chloride: 102 mmol/L (ref 96–106)
Creatinine, Ser: 0.83 mg/dL (ref 0.57–1.00)
Globulin, Total: 2.3 g/dL (ref 1.5–4.5)
Glucose: 102 mg/dL — ABNORMAL HIGH (ref 70–99)
Potassium: 4 mmol/L (ref 3.5–5.2)
Sodium: 140 mmol/L (ref 134–144)
Total Protein: 6.5 g/dL (ref 6.0–8.5)
eGFR: 79 mL/min/{1.73_m2} (ref >59)

## 2021-09-17 LAB — CBC WITH DIFFERENTIAL/PLATELET
Basophils Absolute: 0 10*3/uL (ref 0.0–0.2)
Basos: 1 %
EOS (ABSOLUTE): 0.1 10*3/uL (ref 0.0–0.4)
Eos: 2 %
Hematocrit: 40.2 % (ref 34.0–46.6)
Hemoglobin: 13.2 g/dL (ref 11.1–15.9)
Immature Grans (Abs): 0 10*3/uL (ref 0.0–0.1)
Immature Granulocytes: 0 %
Lymphocytes Absolute: 1 10*3/uL (ref 0.7–3.1)
Lymphs: 29 %
MCH: 28.3 pg (ref 26.6–33.0)
MCHC: 32.8 g/dL (ref 31.5–35.7)
MCV: 86 fL (ref 79–97)
Monocytes Absolute: 0.4 10*3/uL (ref 0.1–0.9)
Monocytes: 12 %
Neutrophils Absolute: 1.9 10*3/uL (ref 1.4–7.0)
Neutrophils: 56 %
Platelets: 221 10*3/uL (ref 150–450)
RBC: 4.67 x10E6/uL (ref 3.77–5.28)
RDW: 14.8 % (ref 11.7–15.4)
WBC: 3.4 10*3/uL (ref 3.4–10.8)

## 2021-09-17 LAB — IRON,TIBC AND FERRITIN PANEL
Ferritin: 38 ng/mL (ref 15–150)
Iron Saturation: 6 % — CL (ref 15–55)
Iron: 24 ug/dL — ABNORMAL LOW (ref 27–139)
Total Iron Binding Capacity: 398 ug/dL (ref 250–450)
UIBC: 374 ug/dL — ABNORMAL HIGH (ref 118–369)

## 2021-10-03 ENCOUNTER — Telehealth: Payer: Self-pay

## 2021-10-03 ENCOUNTER — Encounter: Payer: Self-pay | Admitting: Gastroenterology

## 2021-10-03 NOTE — Telephone Encounter (Signed)
Spoke with patient to remind her that she is due for repeat labs by Wednesday. No appointment is necessary. Patient is aware that she can stop by the lab in the basement at her convenience between 7:30 AM - 5 PM. Patient verbalized understanding and had no concerns at the end of the call.  ? ?

## 2021-10-05 ENCOUNTER — Other Ambulatory Visit (INDEPENDENT_AMBULATORY_CARE_PROVIDER_SITE_OTHER): Payer: 59

## 2021-10-05 DIAGNOSIS — D509 Iron deficiency anemia, unspecified: Secondary | ICD-10-CM | POA: Diagnosis not present

## 2021-10-05 LAB — CBC WITH DIFFERENTIAL/PLATELET
Basophils Absolute: 0 10*3/uL (ref 0.0–0.1)
Basophils Relative: 0.6 % (ref 0.0–3.0)
Eosinophils Absolute: 0.1 10*3/uL (ref 0.0–0.7)
Eosinophils Relative: 2.4 % (ref 0.0–5.0)
HCT: 37.1 % (ref 36.0–46.0)
Hemoglobin: 12.3 g/dL (ref 12.0–15.0)
Lymphocytes Relative: 24.1 % (ref 12.0–46.0)
Lymphs Abs: 1.4 10*3/uL (ref 0.7–4.0)
MCHC: 33.2 g/dL (ref 30.0–36.0)
MCV: 87 fl (ref 78.0–100.0)
Monocytes Absolute: 0.6 10*3/uL (ref 0.1–1.0)
Monocytes Relative: 10.2 % (ref 3.0–12.0)
Neutro Abs: 3.5 10*3/uL (ref 1.4–7.7)
Neutrophils Relative %: 62.7 % (ref 43.0–77.0)
Platelets: 238 10*3/uL (ref 150.0–400.0)
RBC: 4.27 Mil/uL (ref 3.87–5.11)
RDW: 18.5 % — ABNORMAL HIGH (ref 11.5–15.5)
WBC: 5.6 10*3/uL (ref 4.0–10.5)

## 2021-10-10 ENCOUNTER — Encounter: Payer: Self-pay | Admitting: Gastroenterology

## 2021-10-10 ENCOUNTER — Ambulatory Visit (AMBULATORY_SURGERY_CENTER): Payer: 59 | Admitting: Gastroenterology

## 2021-10-10 VITALS — BP 128/78 | HR 78 | Temp 96.8°F | Resp 15 | Ht 64.0 in | Wt 196.0 lb

## 2021-10-10 DIAGNOSIS — R195 Other fecal abnormalities: Secondary | ICD-10-CM | POA: Diagnosis not present

## 2021-10-10 DIAGNOSIS — K2960 Other gastritis without bleeding: Secondary | ICD-10-CM | POA: Diagnosis not present

## 2021-10-10 DIAGNOSIS — K648 Other hemorrhoids: Secondary | ICD-10-CM

## 2021-10-10 DIAGNOSIS — D500 Iron deficiency anemia secondary to blood loss (chronic): Secondary | ICD-10-CM

## 2021-10-10 DIAGNOSIS — K259 Gastric ulcer, unspecified as acute or chronic, without hemorrhage or perforation: Secondary | ICD-10-CM

## 2021-10-10 DIAGNOSIS — K5730 Diverticulosis of large intestine without perforation or abscess without bleeding: Secondary | ICD-10-CM | POA: Diagnosis not present

## 2021-10-10 DIAGNOSIS — K921 Melena: Secondary | ICD-10-CM | POA: Diagnosis not present

## 2021-10-10 MED ORDER — SODIUM CHLORIDE 0.9 % IV SOLN: 500.0000 mL | Freq: Once | INTRAVENOUS | Status: DC

## 2021-10-10 NOTE — Progress Notes (Signed)
To Pacu, vss. Report to Rn.tb ?

## 2021-10-10 NOTE — Progress Notes (Signed)
Called to room to assist during endoscopic procedure.  Patient ID and intended procedure confirmed with present staff. Received instructions for my participation in the procedure from the performing physician.  

## 2021-10-10 NOTE — Op Note (Signed)
[facility] ?Patient Name: Marissa Duarte ?Procedure Date: 10/10/2021 3:07 PM ?MRN: [id number] ?Endoscopist: Starr Lake. Myrtie Neither , MD ?Age: 61 ?Referring MD:  ?Date of Birth: 1960/08/05 ?Gender: Female ?Account #: [id number] ?Procedure:                Colonoscopy ?Indications:              Heme positive stool, Iron deficiency anemia  ?                          secondary to chronic blood loss ?Medicines:                Monitored Anesthesia Care ?Procedure:                Pre-Anesthesia Assessment: ?                          - Prior to the procedure, a History and Physical  ?                          was performed, and patient medications and  ?                          allergies were reviewed. The patient's tolerance of  ?                          previous anesthesia was also reviewed. The risks  ?                          and benefits of the procedure and the sedation  ?                          options and risks were discussed with the patient.  ?                          All questions were answered, and informed consent  ?                          was obtained. Prior Anticoagulants: The patient has  ?                          taken no previous anticoagulant or antiplatelet  ?                          agents. ASA Grade Assessment: II - A patient with  ?                          mild systemic disease. After reviewing the risks  ?                          and benefits, the patient was deemed in  ?                          satisfactory condition to undergo the procedure. ?  After obtaining informed consent, the colonoscope  ?                          was passed under direct vision. Throughout the  ?                          procedure, the patient's blood pressure, pulse, and  ?                          oxygen saturations were monitored continuously. The  ?                          Olympus CF-HQ190L (Serial# [id number]) Colonoscope was  ?                          introduced through the anus and  advanced to the the  ?                          cecum, identified by appendiceal orifice and  ?                          ileocecal valve. The colonoscopy was somewhat  ?                          difficult due to a redundant colon and significant  ?                          looping. Successful completion of the procedure was  ?                          aided by using manual pressure and straightening  ?                          and shortening the scope to obtain bowel loop  ?                          reduction. The patient tolerated the procedure  ?                          well. The quality of the bowel preparation was  ?                          excellent. The ileocecal valve, appendiceal  ?                          orifice, and rectum were photographed. ?Scope In: 3:17:12 PM ?Scope Out: 3:29:13 PM ?Scope Withdrawal Time: 0 hours 6 minutes 54 seconds  ?Total Procedure Duration: 0 hours 12 minutes 1 second  ?Findings:                 The perianal and digital rectal examinations were  ?                          normal. ?  A few small-mouthed diverticula were found in the  ?                          left colon. ?                          Internal hemorrhoids were found. The hemorrhoids  ?                          were small. ?                          The exam was otherwise without abnormality on  ?                          direct and retroflexion views. ?Complications:            No immediate complications. ?Estimated Blood Loss:     Estimated blood loss: none. ?Impression:               - Diverticulosis in the left colon. ?                          - Internal hemorrhoids. ?                          - The examination was otherwise normal on direct  ?                          and retroflexion views. ?                          - No specimens collected. ?Recommendation:           - Patient has a contact number available for  ?                          emergencies. The signs and symptoms of  potential  ?                          delayed complications were discussed with the  ?                          patient. Return to normal activities tomorrow.  ?                          Written discharge instructions were provided to the  ?                          patient. ?                          - Resume previous diet. ?                          - Continue present medications. ?                          -  Repeat colonoscopy in 10 years for screening  ?                          purposes. (replace the current recall that was  ?                          placed after the 2018 colonoscopy) ?                          - See the other procedure note for documentation of  ?                          additional recommendations. ?Geraldine Tesar L. Myrtie Neither, MD ?10/10/2021 3:31:59 PM ?This report has been signed electronically. ?

## 2021-10-10 NOTE — Progress Notes (Signed)
Pt's states no medical or surgical changes since previsit or office visit. 

## 2021-10-10 NOTE — Progress Notes (Signed)
History and Physical: ? This patient presents for endoscopic testing for: ?Encounter Diagnoses  ?Name Primary?  ? Iron deficiency anemia secondary to blood loss (chronic) Yes  ? Heme positive stool   ? ? ?Clinical details in [facility] office consult note of 09/09/2021. ?Recurrent iron deficiency anemia with heme positive stool.  While on iron therapy the patient's repeat hemoglobin on 10/05/2021 had normalized. ?She had a normal screening colonoscopy with me April 2018. ? ?ROS: ?Patient denies chest pain or shortness of breath ? ? ?Past Medical History: ?Past Medical History:  ?Diagnosis Date  ? Acid reflux   ? Esophagitis 3/11   ? noted on EGD  ? Hypertension   ? Kidney stone   ? Shingles 2021  ? ? ? ?Past Surgical History: ?Past Surgical History:  ?Procedure Laterality Date  ? BREAST LUMPECTOMY Left age 19  ? fibroadenoma  ? COLONOSCOPY    ? TUBAL LIGATION  1990  ? UPPER GASTROINTESTINAL ENDOSCOPY    ? WISDOM TOOTH EXTRACTION    ? ? ?Allergies: ?No Known Allergies ? ?Outpatient Meds: ?Current Outpatient Medications  ?Medication Sig Dispense Refill  ? Ascorbic Acid (VITAMIN C) 1000 MG tablet Take 1,000 mg by mouth daily.    ? Esomeprazole Magnesium (NEXIUM PO) Take by mouth. OTC- every other day    ? Omega-3 Fatty Acids (FISH OIL PO) Take by mouth daily.    ? Turmeric (QC TUMERIC COMPLEX PO) Take by mouth.    ? valsartan-hydrochlorothiazide (DIOVAN-HCT) 160-25 MG tablet TAKE 1/2 TABLET BY MOUTH EVERY DAY 45 tablet 1  ? ferrous sulfate 325 (65 FE) MG tablet TAKE 1 TABLET BY MOUTH EVERY DAY 30 tablet 1  ? ?Current Facility-Administered Medications  ?Medication Dose Route Frequency Provider Last Rate Last Admin  ? 0.9 %  sodium chloride infusion  500 mL Intravenous Once Sherrilyn Rist, MD      ? ? ? ? ?___________________________________________________________________ ?Objective  ? ?Exam: ? ?BP (!) 137/97   Pulse 85   Temp (!) 96.8 ?F (36 ?C)   Ht 5\' 4"  (1.626 m)   Wt 196 lb (88.9 kg)   LMP 06/19/2012   BMI  33.64 kg/m?  ? ?CV: RRR without murmur, S1/S2 ?Resp: clear to auscultation bilaterally, normal RR and effort noted ?GI: soft, no tenderness, with active bowel sounds. ? ? ?Assessment: ?Encounter Diagnoses  ?Name Primary?  ? Iron deficiency anemia secondary to blood loss (chronic) Yes  ? Heme positive stool   ? ? ? ?Plan: ?Colonoscopy ?EGD ? The benefits and risks of the planned procedure were described in detail with the patient or (when appropriate) their health care proxy.  Risks were outlined as including, but not limited to, bleeding, infection, perforation, adverse medication reaction leading to cardiac or pulmonary decompensation, pancreatitis (if ERCP).  The limitation of incomplete mucosal visualization was also discussed.  No guarantees or warranties were given. ? ? ? ?The patient is appropriate for an endoscopic procedure in the ambulatory setting. ? ? - 08/17/2012, MD ? ? ? ? ?

## 2021-10-10 NOTE — Patient Instructions (Signed)
Handouts Provided:  Diverticulosis  YOU HAD AN ENDOSCOPIC PROCEDURE TODAY AT THE [facility]:   Refer to the procedure report that was given to you for any specific questions about what was found during the examination.  If the procedure report does not answer your questions, please call your gastroenterologist to clarify.  If you requested that your care partner not be given the details of your procedure findings, then the procedure report has been included in a sealed envelope for you to review at your convenience later.  YOU SHOULD EXPECT: Some feelings of bloating in the abdomen. Passage of more gas than usual.  Walking can help get rid of the air that was put into your GI tract during the procedure and reduce the bloating. If you had a lower endoscopy (such as a colonoscopy or flexible sigmoidoscopy) you may notice spotting of blood in your stool or on the toilet paper. If you underwent a bowel prep for your procedure, you may not have a normal bowel movement for a few days.  Please Note:  You might notice some irritation and congestion in your nose or some drainage.  This is from the oxygen used during your procedure.  There is no need for concern and it should clear up in a day or so.  SYMPTOMS TO REPORT IMMEDIATELY:   Following lower endoscopy (colonoscopy or flexible sigmoidoscopy):  Excessive amounts of blood in the stool  Significant tenderness or worsening of abdominal pains  Swelling of the abdomen that is new, acute  Fever of 100F or higher   Following upper endoscopy (EGD)  Vomiting of blood or coffee ground material  New chest pain or pain under the shoulder blades  Painful or persistently difficult swallowing  New shortness of breath  Fever of 100F or higher  Black, tarry-looking stools  For urgent or emergent issues, a gastroenterologist can be reached at any hour by calling [phone]. Do not use MyChart messaging for urgent concerns.    DIET:  We  do recommend a small meal at first, but then you may proceed to your regular diet.  Drink plenty of fluids but you should avoid alcoholic beverages for 24 hours.  ACTIVITY:  You should plan to take it easy for the rest of today and you should NOT DRIVE or use heavy machinery until tomorrow (because of the sedation medicines used during the test).    FOLLOW UP: Our staff will call the number listed on your records 48-72 hours following your procedure to check on you and address any questions or concerns that you may have regarding the information given to you following your procedure. If we do not reach you, we will leave a message.  We will attempt to reach you two times.  During this call, we will ask if you have developed any symptoms of COVID 19. If you develop any symptoms (ie: fever, flu-like symptoms, shortness of breath, cough etc.) before then, please call [phone].  If you test positive for Covid 19 in the 2 weeks post procedure, please call and report this information to us.    If any biopsies were taken you will be contacted by phone or by letter within the next 1-3 weeks.  Please call us at [phone] if you have not heard about the biopsies in 3 weeks.    SIGNATURES/CONFIDENTIALITY: You and/or your care partner have signed paperwork which will be entered into your electronic medical record.  These signatures attest to the fact that   information above on your After Visit Summary has been reviewed and is understood.  Full responsibility of the confidentiality of this discharge information lies with you and/or your care-partner. ? ?

## 2021-10-10 NOTE — Op Note (Signed)
[facility] ?Patient Name: Marissa Duarte ?Procedure Date: 10/10/2021 3:07 PM ?MRN: [id number] ?Endoscopist: Starr Lake. Myrtie Neither , MD ?Age: 61 ?Referring MD:  ?Date of Birth: 1960-09-14 ?Gender: Female ?Account #: [id number] ?Procedure:                Upper GI endoscopy ?Indications:              Iron deficiency anemia secondary to chronic blood  ?                          loss, Heme positive stool ?                          (recurrent since last year) ?Medicines:                Monitored Anesthesia Care ?Procedure:                Pre-Anesthesia Assessment: ?                          - Prior to the procedure, a History and Physical  ?                          was performed, and patient medications and  ?                          allergies were reviewed. The patient's tolerance of  ?                          previous anesthesia was also reviewed. The risks  ?                          and benefits of the procedure and the sedation  ?                          options and risks were discussed with the patient.  ?                          All questions were answered, and informed consent  ?                          was obtained. Prior Anticoagulants: The patient has  ?                          taken no previous anticoagulant or antiplatelet  ?                          agents. ASA Grade Assessment: II - A patient with  ?                          mild systemic disease. After reviewing the risks  ?                          and benefits, the patient was deemed in  ?  satisfactory condition to undergo the procedure. ?                          After obtaining informed consent, the endoscope was  ?                          passed under direct vision. Throughout the  ?                          procedure, the patient's blood pressure, pulse, and  ?                          oxygen saturations were monitored continuously. The  ?                          Endoscope was introduced through the mouth, and  ?                           advanced to the second part of duodenum. The upper  ?                          GI endoscopy was accomplished without difficulty.  ?                          The patient tolerated the procedure well. ?Scope In: ?Scope Out: ?Findings:                 The larynx was normal. ?                          A 10 cm hiatal hernia was present. The esophagus  ?                          was foreshortened as a result (EGJ 32 cm from  ?                          incisors) ?                          There is no endoscopic evidence of esophagitis or  ?                          stricture in the entire esophagus. ?                          Multiple linear erosions with stigmata of recent  ?                          bleeding were found in the gastric body (several on  ?                          both lesser and greater curvatures). Several  ?                          biopsies were obtained on the  greater curvature of  ?                          the gastric body, on the lesser curvature of the  ?                          gastric body, on the greater curvature of the  ?                          gastric antrum and on the lesser curvature of the  ?                          gastric antrum with cold forceps for histology. ?                          The exam of the stomach was otherwise normal. ?                          The cardia and gastric fundus were normal on  ?                          retroflexion. ?                          The examined duodenum was normal. ?Complications:            No immediate complications. ?Estimated Blood Loss:     Estimated blood loss was minimal. ?Impression:               - Normal larynx. ?                          - 10 cm hiatal hernia. ?                          - Gastric erosions with stigmata of recent bleeding. ?                          - Normal examined duodenum. ?                          - Several biopsies were obtained on the greater  ?                          curvature of the  gastric body, on the lesser  ?                          curvature of the gastric body, on the greater  ?                          curvature of the gastric antrum and on the lesser  ?                          curvature of the gastric antrum. ?  Though not within the hernia sac, these appear to  ?                          be typical "traction" erosions form the hiatal  ?                          hernia and causing recurrent IDA. ?Recommendation:           - Patient has a contact number available for  ?                          emergencies. The signs and symptoms of potential  ?                          delayed complications were discussed with the  ?                          patient. Return to normal activities tomorrow.  ?                          Written discharge instructions were provided to the  ?                          patient. ?                          - Resume previous diet. ?                          - Continue present medications. ?                          - Await pathology results. ?                          - Patient asked to consider referral to CT surgery  ?                          for hiatal hernia repair. Patient also reports  ?                          chronic cough suspected to be GERD since it is  ?                          under control with QOD PPI. ?                          - Have CBC and iron levels checked by PCP in 2  ?                          months. ?                          Take iron tablet every other day. ?  Have PCP check CBC and iron levels in 2 months. ?Kyaire Gruenewald L. Myrtie Neitheranis, MD ?10/10/2021 3:51:28 PM ?This report has been signed electronically. ?

## 2021-10-12 ENCOUNTER — Other Ambulatory Visit: Payer: Self-pay | Admitting: Physician Assistant

## 2021-10-12 ENCOUNTER — Telehealth: Payer: Self-pay | Admitting: *Deleted

## 2021-10-12 ENCOUNTER — Telehealth: Payer: Self-pay

## 2021-10-12 DIAGNOSIS — D500 Iron deficiency anemia secondary to blood loss (chronic): Secondary | ICD-10-CM

## 2021-10-12 DIAGNOSIS — K2960 Other gastritis without bleeding: Secondary | ICD-10-CM

## 2021-10-12 DIAGNOSIS — D649 Anemia, unspecified: Secondary | ICD-10-CM

## 2021-10-12 DIAGNOSIS — K449 Diaphragmatic hernia without obstruction or gangrene: Secondary | ICD-10-CM

## 2021-10-12 NOTE — Telephone Encounter (Signed)
CT chest order in epic. Secure staff message sent to radiology scheduling to contact pt to set up her appt.  ? ?Called and spoke with patient. She is aware that referral has been placed, and Dr. Kipp Brood requires CT of chest prior to consultation. Pt is aware that we have placed the CT order and knows to expect a call from radiology scheduling. Pt verbalized understanding and had no concerns at the end of the call. ?

## 2021-10-12 NOTE — Telephone Encounter (Signed)
-----   Message from Sherrilyn Rist, MD sent at 10/12/2021  8:08 AM EDT ----- ?Regarding: Surgical referral ?Lateasha Breuer, ? ?I saw this patient for EGD and colonoscopy this week working up iron deficiency anemia. ? ?We discussed the possibility of a surgical referral for her hiatal hernia, and I received a message from today's endoscopy nurse follow-up call that the patient would like to pursue that. ? ?Please send referral to Dr. Wanda Plump of cardiothoracic surgery for: Symptomatic hiatal hernia with GERD and iron deficiency anemia from gastric erosions. ? ?Thank you. ? ?- HD ? ?

## 2021-10-12 NOTE — Telephone Encounter (Signed)
CT is scheduled at [facility] on 10/17/21 at 4:30 pm.  ?

## 2021-10-12 NOTE — Addendum Note (Signed)
Addended by: Missy Sabins on: 10/12/2021 11:09 AM ? ? Modules accepted: Orders ? ?

## 2021-10-12 NOTE — Telephone Encounter (Signed)
Doran Stabler, MD  Marylen Ponto, LPN; Yevette Edwards, RN ?Will do.  ?Geneva Pallas, please let the patient know and then schedule CT scan of the chest with IV contrast for presurgical evaluation of hiatal hernia. HD   ?Previous Messages ?  ?----- Message -----  ?From: Marylen Ponto, LPN  ?Sent: 10/12/2021  10:37 AM EDT  ?To: Doran Stabler, MD, Yevette Edwards, RN  ?Subject: referral                                      ? ?Hi Dr Loletha Carrow,  ?Dr Kipp Brood received the referral, He requires a Chest CT prior to surgical consults for Hiatal Hernia.  ?Can you please set this up for the patient and I will be happy to schedule her. Thanks for your help.  ?Linden Dolin  ?

## 2021-10-12 NOTE — Telephone Encounter (Signed)
Thank you for the note. I will communicate with my nurse and have a referral sent to CT surgery for the hiatal hernia. ? ?- HD ?

## 2021-10-12 NOTE — Telephone Encounter (Signed)
?  Follow up Call- ? ? ?  10/10/2021  ?  2:22 PM  ?Call back number  ?Post procedure Call Back phone  # [phone]  ?Permission to leave phone message Yes  ?  ? ?Patient questions: ? ?Do you have a fever, pain , or abdominal swelling? No. ?Pain Score  0 * ? ?Have you tolerated food without any problems? Yes. ? ?Have you been able to return to your normal activities? Yes. ? ?Do you have any questions about your discharge instructions: ?Diet   No. ?Medications  No. ?Follow up visit  No. ? ?Do you have questions or concerns about your Care? Yes. Pt would like a referral to a surgeon for hiatal hernia.  ? ?Actions: ?* If pain score is 4 or above: ?No action needed, pain <4. ? ? ? ? ?

## 2021-10-12 NOTE — Telephone Encounter (Signed)
Ambulatory referral to Cardiothoracic surgery in epic.  ?

## 2021-10-17 ENCOUNTER — Encounter: Payer: Self-pay | Admitting: Gastroenterology

## 2021-10-17 ENCOUNTER — Ambulatory Visit (HOSPITAL_COMMUNITY)
Admission: RE | Admit: 2021-10-17 | Discharge: 2021-10-17 | Disposition: A | Discharge status: Home / Self Care | Payer: 59 | Source: Ambulatory Visit | Attending: Gastroenterology | Admitting: Gastroenterology

## 2021-10-17 DIAGNOSIS — K449 Diaphragmatic hernia without obstruction or gangrene: Secondary | ICD-10-CM | POA: Diagnosis present

## 2021-10-17 DIAGNOSIS — D500 Iron deficiency anemia secondary to blood loss (chronic): Secondary | ICD-10-CM | POA: Diagnosis not present

## 2021-10-17 DIAGNOSIS — K219 Gastro-esophageal reflux disease without esophagitis: Secondary | ICD-10-CM | POA: Insufficient documentation

## 2021-10-17 DIAGNOSIS — K2960 Other gastritis without bleeding: Secondary | ICD-10-CM | POA: Insufficient documentation

## 2021-10-17 MED ORDER — SODIUM CHLORIDE (PF) 0.9 % IJ SOLN
INTRAMUSCULAR | Status: AC
  Filled 2021-10-17: qty 50

## 2021-10-17 MED ORDER — IOHEXOL 300 MG/ML  SOLN
75.0000 mL | Freq: Once | INTRAMUSCULAR | Status: AC | PRN
  Administered 2021-10-17: 75 mL via INTRAVENOUS

## 2021-10-20 NOTE — Progress Notes (Signed)
Left msg for patient to call back. AS, CMA 

## 2021-10-28 ENCOUNTER — Encounter: Payer: 59 | Admitting: Thoracic Surgery (Cardiothoracic Vascular Surgery)

## 2021-10-28 ENCOUNTER — Ambulatory Visit: Payer: 59 | Admitting: Thoracic Surgery (Cardiothoracic Vascular Surgery)

## 2021-11-14 ENCOUNTER — Other Ambulatory Visit: Payer: Self-pay | Admitting: Physician Assistant

## 2021-11-14 DIAGNOSIS — D649 Anemia, unspecified: Secondary | ICD-10-CM

## 2021-11-16 NOTE — Progress Notes (Unsigned)
[address]       [place] [id number]             [phone]                    Marissa BaseBobbie R [facility] Medical Record #[id number] Date of Birth: August 01, 1960  Referring: Mayer MaskerAbonza, Maritza, PA-C Primary Care: Mayer MaskerAbonza, Maritza, PA-C Primary Cardiologist: None  Chief Complaint:   No chief complaint on file.   History of Present Illness:    Marissa Duarte 61 y.o. female referred by Dr. Liborio NixonJanice for surgical evaluation of a large ventral hernia.  She recently was evaluated and insomnia, and subsequently for endoscopy which showed a 10 cm hiatal hernia with multiple erosions in the gastric body.  In regards to her anemia she was treated with iron supplements and have some improvement in her levels, so she stopped she became symptomatic.  From a digestive standpoint she denies any significant dysphagia or odynophagia, but does admit to reflux.  Her weight has been stable     Zubrod Score: At the time of surgery this patient's most appropriate activity status/level should be described as: [x]     0    Normal activity, no symptoms []     1    Restricted in physical strenuous activity but ambulatory, able to do out light work []     2    Ambulatory and capable of self care, unable to do work activities, up and about               >50 % of waking hours                              []     3    Only limited self care, in bed greater than 50% of waking hours []     4    Completely disabled, no self care, confined to bed or chair []     5    Moribund   Past Medical History:  Diagnosis Date   Acid reflux    Esophagitis 3/11    noted on EGD   Hypertension    Kidney stone    Shingles 2021    Past Surgical History:  Procedure Laterality Date   BREAST LUMPECTOMY Left age 61   fibroadenoma   COLONOSCOPY     TUBAL LIGATION  1990   UPPER GASTROINTESTINAL ENDOSCOPY     WISDOM TOOTH EXTRACTION      Family History  Problem Relation Age of Onset   Hypertension Mother     Thyroid disease Mother    Hypertension Father    Polycystic kidney disease Father    Cancer Father        lung   Infertility Sister    Hypertension Sister    Polycystic kidney disease Sister    Healthy Brother    Diabetes Maternal Grandfather    Heart attack Maternal Uncle    Colon cancer Neg Hx    Esophageal cancer Neg Hx    Rectal cancer Neg Hx    Stomach cancer Neg Hx    Colon polyps Neg Hx      Social History   Tobacco Use  Smoking Status Former   Packs/day: 0.50   Years: 20.00   Pack years: 10.00   Types: Cigarettes   Quit date: 06/19/1996   Years since quitting: 25.4  Smokeless Tobacco Never  Social History   Substance and Sexual Activity  Alcohol Use Yes   Alcohol/week: 6.0 standard drinks   Types: 6 Glasses of wine per week   Comment: occasionally     No Known Allergies  Current Outpatient Medications  Medication Sig Dispense Refill   Ascorbic Acid (VITAMIN C) 1000 MG tablet Take 1,000 mg by mouth daily.     Esomeprazole Magnesium (NEXIUM PO) Take by mouth. OTC- every other day     ferrous sulfate 325 (65 FE) MG tablet TAKE 1 TABLET BY MOUTH EVERY DAY 30 tablet 1   Omega-3 Fatty Acids (FISH OIL PO) Take by mouth daily.     Turmeric (QC TUMERIC COMPLEX PO) Take by mouth.     valsartan-hydrochlorothiazide (DIOVAN-HCT) 160-25 MG tablet TAKE 1/2 TABLET BY MOUTH EVERY DAY 45 tablet 1   No current facility-administered medications for this visit.    Review of Systems  Constitutional:  Positive for malaise/fatigue.  Respiratory:  Positive for shortness of breath.   Cardiovascular:  Negative for chest pain.  Gastrointestinal:  Positive for blood in stool.  Neurological:  Positive for dizziness.    PHYSICAL EXAMINATION: LMP 06/19/2012  Physical Exam Constitutional:      General: She is not in acute distress.    Appearance: Normal appearance. She is normal weight. She is not ill-appearing.  Eyes:     Extraocular Movements: Extraocular movements  intact.  Cardiovascular:     Rate and Rhythm: Normal rate.  Pulmonary:     Effort: Pulmonary effort is normal. No respiratory distress.  Abdominal:     General: There is no distension.  Musculoskeletal:        General: Normal range of motion.     Cervical back: Normal range of motion.  Skin:    General: Skin is warm and dry.  Neurological:     General: No focal deficit present.     Mental Status: She is alert and oriented to person, place, and time.    Diagnostic Studies & Laboratory data:    CT Scan:    EGD/EUS: 4/23.  Dr. Myrtie Neither - The larynx was normal. - A 10 cm hiatal hernia was present. The esophagus was foreshortened as a result (EGJ 32 cm from incisors) - There is no endoscopic evidence of esophagitis or stricture in the entire esophagus. - Multiple linear erosions with stigmata of recent bleeding were found in the gastric body (several on both lesser and greater curvatures). Several biopsies were obtained on the greater curvature of the gastric body, on the lesser curvature of the gastric body, on the greater curvature of the gastric antrum and on the lesser curvature of the gastric antrum with cold forceps for histology. - The exam of the stomach was otherwise normal. - The cardia and gastric fundus were normal on retroflexion. - The examined duodenum was normal.  Path:  Surgical [P], random gastric - BENIGN GASTRIC MUCOSA - NO H. PYLORI, INTESTINAL METAPLASIA OR MALIGNANCY IDENTIFIED       I have independently reviewed the above radiology studies  and reviewed the findings with the patient.   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  She also has been treated for iron deficiency anemia.  We discussed the risks and benefits of endoscopy, assisted paraesophageal hernia repair and fundoplication.  She is agreeable to proceed.     I  spent 40 minutes with the patient face to face counseling and coordination of care.    Corliss Skains 11/16/2021 8:21 AM

## 2021-11-18 ENCOUNTER — Other Ambulatory Visit: Payer: Self-pay | Admitting: *Deleted

## 2021-11-18 ENCOUNTER — Institutional Professional Consult (permissible substitution) (INDEPENDENT_AMBULATORY_CARE_PROVIDER_SITE_OTHER): Payer: 59 | Admitting: Thoracic Surgery (Cardiothoracic Vascular Surgery)

## 2021-11-18 ENCOUNTER — Encounter: Payer: Self-pay | Admitting: *Deleted

## 2021-11-18 VITALS — BP 156/104 | HR 84 | Resp 20 | Ht 64.0 in | Wt 196.0 lb

## 2021-11-18 DIAGNOSIS — K449 Diaphragmatic hernia without obstruction or gangrene: Secondary | ICD-10-CM

## 2021-12-09 ENCOUNTER — Other Ambulatory Visit: Payer: 59

## 2021-12-15 ENCOUNTER — Encounter: Payer: 59 | Admitting: Physician Assistant

## 2021-12-20 ENCOUNTER — Other Ambulatory Visit: Payer: Self-pay | Admitting: Physician Assistant

## 2021-12-20 DIAGNOSIS — D649 Anemia, unspecified: Secondary | ICD-10-CM

## 2021-12-22 ENCOUNTER — Other Ambulatory Visit (HOSPITAL_COMMUNITY): Payer: 59

## 2021-12-22 NOTE — Pre-Procedure Instructions (Signed)
Surgical Instructions    Your procedure is scheduled on Tuesday, July 11.  Report to [facility] Main Entrance "A" at 5:30 A.M., then check in with the Admitting office.  Call this number if you have problems the morning of surgery:  [phone]   If you have any questions prior to your surgery date call [phone]: Open Monday-Friday 8am-4pm    Remember:  Do not eat or drink after midnight the night before your surgery     Take these medicines the morning of surgery with A SIP OF WATER:  esomeprazole (NEXIUM)    As of today, STOP taking any Aspirin (unless otherwise instructed by your surgeon) Aleve, Naproxen, Ibuprofen, Motrin, Advil, Goody's, BC's, all herbal medications, fish oil, and all vitamins.   [facility] is not responsible for any belongings or valuables.   Do NOT Smoke (Tobacco/Vaping)  24 hours prior to your procedure  If you use a CPAP at night, you may bring your mask for your overnight stay.   Contacts, glasses, hearing aids, dentures or partials may not be worn into surgery, please bring cases for these belongings   For patients admitted to the hospital, discharge time will be determined by your treatment team.   Patients discharged the day of surgery will not be allowed to drive home, and someone needs to stay with them for 24 hours.   SURGICAL WAITING ROOM VISITATION Patients having surgery or a procedure in a hospital may have two support people. Children under the age of 43 must have an adult with them who is not the patient. They may stay in the waiting area during the procedure and may switch out with other visitors. If the patient needs to stay at the hospital during part of their recovery, the visitor guidelines for inpatient rooms apply.  Please refer to the [facility] website for the visitor guidelines for Inpatients (after your surgery is over and you are in a regular room).       Special instructions:    Oral Hygiene is also important to  reduce your risk of infection.  Remember - BRUSH YOUR TEETH THE MORNING OF SURGERY WITH YOUR REGULAR TOOTHPASTE   [place]- Preparing For Surgery  Before surgery, you can play an important role. Because skin is not sterile, your skin needs to be as free of germs as possible. You can reduce the number of germs on your skin by washing with CHG (chlorahexidine gluconate) Soap before surgery.  CHG is an antiseptic cleaner which kills germs and bonds with the skin to continue killing germs even after washing.     Please do not use if you have an allergy to CHG or antibacterial soaps. If your skin becomes reddened/irritated stop using the CHG.  Do not shave (including legs and underarms) for at least 48 hours prior to first CHG shower. It is OK to shave your face.  Please follow these instructions carefully.     Shower the NIGHT BEFORE SURGERY and the MORNING OF SURGERY with CHG Soap.   If you chose to wash your hair, wash your hair first as usual with your normal shampoo. After you shampoo, rinse your hair and body thoroughly to remove the shampoo.  Then [employer] and genitals (private parts) with your normal soap and rinse thoroughly to remove soap.  After that Use CHG Soap as you would any other liquid soap. You can apply CHG directly to the skin and wash gently with a scrungie or a clean washcloth.  Apply the CHG Soap to your body ONLY FROM THE NECK DOWN.  Do not use on open wounds or open sores. Avoid contact with your eyes, ears, mouth and genitals (private parts). Wash Face and genitals (private parts)  with your normal soap.   Wash thoroughly, paying special attention to the area where your surgery will be performed.  Thoroughly rinse your body with warm water from the neck down.  DO NOT shower/wash with your normal soap after using and rinsing off the CHG Soap.  Pat yourself dry with a CLEAN TOWEL.  Wear CLEAN PAJAMAS to bed the night before surgery  Place CLEAN SHEETS on your  bed the night before your surgery  DO NOT SLEEP WITH PETS.   Day of Surgery:  Take a shower with CHG soap. Wear Clean/Comfortable clothing the morning of surgery Do not apply any deodorants/lotions.   Do not wear jewelry or makeup Do not wear lotions, powders, perfumes/colognes, or deodorant. Do not shave 48 hours prior to surgery.  Men may shave face and neck. Do not bring valuables to the hospital. Do not wear nail polish, gel polish, artificial nails, or any other type of covering on natural nails (fingers and toes) If you have artificial nails or gel coating that need to be removed by a nail salon, please have this removed prior to surgery. Artificial nails or gel coating may interfere with anesthesia's ability to adequately monitor your vital signs. Remember to brush your teeth WITH YOUR REGULAR TOOTHPASTE.    If you received a COVID test during your pre-op visit, it is requested that you wear a mask when out in public, stay away from anyone that may not be feeling well, and notify your surgeon if you develop symptoms. If you have been in contact with anyone that has tested positive in the last 10 days, please notify your surgeon.    Please read over the following fact sheets that you were given.

## 2021-12-23 ENCOUNTER — Other Ambulatory Visit: Payer: Self-pay

## 2021-12-23 ENCOUNTER — Ambulatory Visit (HOSPITAL_COMMUNITY)
Admission: RE | Admit: 2021-12-23 | Discharge: 2021-12-23 | Disposition: A | Discharge status: Home / Self Care | Payer: 59 | Source: Ambulatory Visit | Attending: Thoracic Surgery (Cardiothoracic Vascular Surgery) | Admitting: Thoracic Surgery (Cardiothoracic Vascular Surgery)

## 2021-12-23 ENCOUNTER — Encounter (HOSPITAL_COMMUNITY)
Admission: RE | Admit: 2021-12-23 | Discharge: 2021-12-23 | Disposition: A | Discharge status: Home / Self Care | Payer: 59 | Source: Ambulatory Visit | Attending: Thoracic Surgery (Cardiothoracic Vascular Surgery) | Admitting: Thoracic Surgery (Cardiothoracic Vascular Surgery)

## 2021-12-23 ENCOUNTER — Encounter (HOSPITAL_COMMUNITY): Payer: Self-pay

## 2021-12-23 VITALS — BP 152/97 | HR 84 | Temp 98.5°F | Resp 17 | Ht 64.0 in | Wt 195.8 lb

## 2021-12-23 DIAGNOSIS — Z20822 Contact with and (suspected) exposure to covid-19: Secondary | ICD-10-CM | POA: Diagnosis not present

## 2021-12-23 DIAGNOSIS — K449 Diaphragmatic hernia without obstruction or gangrene: Secondary | ICD-10-CM | POA: Insufficient documentation

## 2021-12-23 DIAGNOSIS — Z01818 Encounter for other preprocedural examination: Secondary | ICD-10-CM | POA: Diagnosis present

## 2021-12-23 HISTORY — DX: Personal history of other diseases of the digestive system: Z87.19

## 2021-12-23 LAB — URINALYSIS, ROUTINE W REFLEX MICROSCOPIC
Bacteria, UA: NONE SEEN
Bilirubin Urine: NEGATIVE
Glucose, UA: NEGATIVE mg/dL
Hgb urine dipstick: NEGATIVE
Ketones, ur: NEGATIVE mg/dL
Leukocytes,Ua: NEGATIVE
Nitrite: NEGATIVE
Protein, ur: NEGATIVE mg/dL
Specific Gravity, Urine: 1.015 (ref 1.005–1.030)
pH: 7 (ref 5.0–8.0)

## 2021-12-23 LAB — CBC
HCT: 44 % (ref 36.0–46.0)
Hemoglobin: 13.9 g/dL (ref 12.0–15.0)
MCH: 29 pg (ref 26.0–34.0)
MCHC: 31.6 g/dL (ref 30.0–36.0)
MCV: 91.7 fL (ref 80.0–100.0)
Platelets: 215 10*3/uL (ref 150–400)
RBC: 4.8 MIL/uL (ref 3.87–5.11)
RDW: 13.8 % (ref 11.5–15.5)
WBC: 5.5 10*3/uL (ref 4.0–10.5)
nRBC: 0 % (ref 0.0–0.2)

## 2021-12-23 LAB — SURGICAL PCR SCREEN
MRSA, PCR: NEGATIVE
Staphylococcus aureus: NEGATIVE

## 2021-12-23 LAB — COMPREHENSIVE METABOLIC PANEL
ALT: 22 U/L (ref 0–44)
AST: 24 U/L (ref 15–41)
Albumin: 3.9 g/dL (ref 3.5–5.0)
Alkaline Phosphatase: 57 U/L (ref 38–126)
Anion gap: 8 (ref 5–15)
BUN: 14 mg/dL (ref 8–23)
CO2: 29 mmol/L (ref 22–32)
Calcium: 8.9 mg/dL (ref 8.9–10.3)
Chloride: 104 mmol/L (ref 98–111)
Creatinine, Ser: 0.79 mg/dL (ref 0.44–1.00)
GFR, Estimated: >60 mL/min (ref >60)
Glucose, Bld: 104 mg/dL — ABNORMAL HIGH (ref 70–99)
Potassium: 3.7 mmol/L (ref 3.5–5.1)
Sodium: 141 mmol/L (ref 135–145)
Total Bilirubin: 1.1 mg/dL (ref 0.3–1.2)
Total Protein: 6.4 g/dL — ABNORMAL LOW (ref 6.5–8.1)

## 2021-12-23 LAB — PROTIME-INR
INR: 1 (ref 0.8–1.2)
Prothrombin Time: 13.1 seconds (ref 11.4–15.2)

## 2021-12-23 LAB — SARS CORONAVIRUS 2 (TAT 6-24 HRS): SARS Coronavirus 2: NEGATIVE

## 2021-12-23 LAB — APTT: aPTT: 26 seconds (ref 24–36)

## 2021-12-23 NOTE — Progress Notes (Signed)
PCP - Mayer Masker, PA-C Cardiologist - denies  PPM/ICD - n/a  Chest x-ray -12/23/21  EKG - 12/23/21 Stress Test - denies ECHO - denies Cardiac Cath - denies  Sleep Study - denies CPAP - denies  Blood Thinner Instructions: n/a Aspirin Instructions: n/a  NPO at MD  COVID TEST- 12/23/21; done in PAT  Anesthesia review: No  Patient denies shortness of breath, fever, cough and chest pain at PAT appointment   All instructions explained to the patient, with a verbal understanding of the material. Patient agrees to go over the instructions while at home for a better understanding. Patient also instructed to self quarantine after being tested for COVID-19. The opportunity to ask questions was provided.

## 2021-12-27 ENCOUNTER — Encounter (HOSPITAL_COMMUNITY): Payer: Self-pay | Admitting: Thoracic Surgery (Cardiothoracic Vascular Surgery)

## 2021-12-27 ENCOUNTER — Encounter (HOSPITAL_COMMUNITY)
Admission: RE | Disposition: A | Discharge status: Home / Self Care | Payer: Self-pay | Source: Home / Self Care | Attending: Thoracic Surgery (Cardiothoracic Vascular Surgery)

## 2021-12-27 ENCOUNTER — Inpatient Hospital Stay (HOSPITAL_COMMUNITY)
Admission: RE | Admit: 2021-12-27 | Discharge: 2021-12-29 | DRG: 327 | Disposition: A | Discharge status: Home / Self Care | Payer: 59 | Attending: Thoracic Surgery (Cardiothoracic Vascular Surgery) | Admitting: Thoracic Surgery (Cardiothoracic Vascular Surgery)

## 2021-12-27 ENCOUNTER — Observation Stay (HOSPITAL_COMMUNITY): Payer: 59

## 2021-12-27 ENCOUNTER — Other Ambulatory Visit: Payer: Self-pay

## 2021-12-27 ENCOUNTER — Ambulatory Visit (HOSPITAL_BASED_OUTPATIENT_CLINIC_OR_DEPARTMENT_OTHER): Payer: 59 | Admitting: Anesthesiology

## 2021-12-27 ENCOUNTER — Encounter (HOSPITAL_BASED_OUTPATIENT_CLINIC_OR_DEPARTMENT_OTHER): Payer: Self-pay | Admitting: *Deleted

## 2021-12-27 ENCOUNTER — Ambulatory Visit (HOSPITAL_COMMUNITY): Payer: 59 | Admitting: Anesthesiology

## 2021-12-27 DIAGNOSIS — K449 Diaphragmatic hernia without obstruction or gangrene: Secondary | ICD-10-CM

## 2021-12-27 DIAGNOSIS — I10 Essential (primary) hypertension: Secondary | ICD-10-CM | POA: Diagnosis present

## 2021-12-27 DIAGNOSIS — Z87891 Personal history of nicotine dependence: Secondary | ICD-10-CM

## 2021-12-27 DIAGNOSIS — Z9889 Other specified postprocedural states: Secondary | ICD-10-CM

## 2021-12-27 DIAGNOSIS — Z801 Family history of malignant neoplasm of trachea, bronchus and lung: Secondary | ICD-10-CM

## 2021-12-27 DIAGNOSIS — J939 Pneumothorax, unspecified: Secondary | ICD-10-CM | POA: Diagnosis not present

## 2021-12-27 DIAGNOSIS — E669 Obesity, unspecified: Secondary | ICD-10-CM | POA: Diagnosis present

## 2021-12-27 DIAGNOSIS — Z8249 Family history of ischemic heart disease and other diseases of the circulatory system: Secondary | ICD-10-CM

## 2021-12-27 DIAGNOSIS — Z6832 Body mass index (BMI) 32.0-32.9, adult: Secondary | ICD-10-CM

## 2021-12-27 DIAGNOSIS — K219 Gastro-esophageal reflux disease without esophagitis: Secondary | ICD-10-CM | POA: Diagnosis present

## 2021-12-27 DIAGNOSIS — D509 Iron deficiency anemia, unspecified: Secondary | ICD-10-CM | POA: Diagnosis present

## 2021-12-27 DIAGNOSIS — Z8719 Personal history of other diseases of the digestive system: Secondary | ICD-10-CM

## 2021-12-27 DIAGNOSIS — Z833 Family history of diabetes mellitus: Secondary | ICD-10-CM

## 2021-12-27 HISTORY — PX: ESOPHAGOGASTRODUODENOSCOPY: SHX5428

## 2021-12-27 HISTORY — PX: XI ROBOTIC ASSISTED PARAESOPHAGEAL HERNIA REPAIR: SHX6871

## 2021-12-27 LAB — CBC
HCT: 40.8 % (ref 36.0–46.0)
Hemoglobin: 13.7 g/dL (ref 12.0–15.0)
MCH: 29.7 pg (ref 26.0–34.0)
MCHC: 33.6 g/dL (ref 30.0–36.0)
MCV: 88.3 fL (ref 80.0–100.0)
Platelets: 206 10*3/uL (ref 150–400)
RBC: 4.62 MIL/uL (ref 3.87–5.11)
RDW: 13.8 % (ref 11.5–15.5)
WBC: 12.5 10*3/uL — ABNORMAL HIGH (ref 4.0–10.5)
nRBC: 0 % (ref 0.0–0.2)

## 2021-12-27 LAB — ABO/RH: ABO/RH(D): O NEG

## 2021-12-27 LAB — TYPE AND SCREEN
ABO/RH(D): O NEG
Antibody Screen: POSITIVE

## 2021-12-27 SURGERY — REPAIR, HERNIA, PARAESOPHAGEAL, ROBOT-ASSISTED
Anesthesia: General | Site: Chest

## 2021-12-27 MED ORDER — BUPIVACAINE LIPOSOME 1.3 % IJ SUSP
INTRAMUSCULAR | Status: DC | PRN
  Administered 2021-12-27: 50 mL

## 2021-12-27 MED ORDER — CEFAZOLIN SODIUM-DEXTROSE 2-4 GM/100ML-% IV SOLN
INTRAVENOUS | Status: AC
  Filled 2021-12-27: qty 100

## 2021-12-27 MED ORDER — ENOXAPARIN SODIUM 40 MG/0.4ML IJ SOSY
40.0000 mg | PREFILLED_SYRINGE | INTRAMUSCULAR | Status: DC
  Administered 2021-12-28 – 2021-12-29 (×2): 40 mg via SUBCUTANEOUS
  Filled 2021-12-27 (×2): qty 0.4

## 2021-12-27 MED ORDER — PROPOFOL 10 MG/ML IV BOLUS
INTRAVENOUS | Status: AC
Start: 2021-12-27 — End: ?
  Filled 2021-12-27: qty 20

## 2021-12-27 MED ORDER — ACETAMINOPHEN 10 MG/ML IV SOLN
INTRAVENOUS | Status: DC | PRN
  Administered 2021-12-27: 1000 mg via INTRAVENOUS

## 2021-12-27 MED ORDER — SUGAMMADEX SODIUM 200 MG/2ML IV SOLN
INTRAVENOUS | Status: DC | PRN
  Administered 2021-12-27: 200 mg via INTRAVENOUS

## 2021-12-27 MED ORDER — LIDOCAINE 2% (20 MG/ML) 5 ML SYRINGE
INTRAMUSCULAR | Status: DC | PRN
  Administered 2021-12-27: 100 mg via INTRAVENOUS

## 2021-12-27 MED ORDER — ONDANSETRON HCL 4 MG/2ML IJ SOLN
INTRAMUSCULAR | Status: AC
  Filled 2021-12-27: qty 2

## 2021-12-27 MED ORDER — FENTANYL CITRATE (PF) 250 MCG/5ML IJ SOLN
INTRAMUSCULAR | Status: AC
  Filled 2021-12-27: qty 5

## 2021-12-27 MED ORDER — BUPIVACAINE LIPOSOME 1.3 % IJ SUSP
INTRAMUSCULAR | Status: AC
Start: 2021-12-27 — End: ?
  Filled 2021-12-27: qty 20

## 2021-12-27 MED ORDER — KETOROLAC TROMETHAMINE 30 MG/ML IJ SOLN: 30.0000 mg | Freq: Once | INTRAMUSCULAR | Status: DC | PRN

## 2021-12-27 MED ORDER — CHLORHEXIDINE GLUCONATE 0.12 % MT SOLN
OROMUCOSAL | Status: AC
  Administered 2021-12-27: 15 mL via OROMUCOSAL
  Filled 2021-12-27: qty 15

## 2021-12-27 MED ORDER — KETAMINE HCL 50 MG/5ML IJ SOSY
PREFILLED_SYRINGE | INTRAMUSCULAR | Status: AC
Start: 2021-12-27 — End: ?
  Filled 2021-12-27: qty 5

## 2021-12-27 MED ORDER — ALBUMIN HUMAN 5 % IV SOLN: INTRAVENOUS | Status: DC | PRN

## 2021-12-27 MED ORDER — CEFAZOLIN SODIUM-DEXTROSE 2-4 GM/100ML-% IV SOLN
2.0000 g | INTRAVENOUS | Status: AC
  Administered 2021-12-27: 2 g via INTRAVENOUS

## 2021-12-27 MED ORDER — 0.9 % SODIUM CHLORIDE (POUR BTL) OPTIME
TOPICAL | Status: DC | PRN
  Administered 2021-12-27: 2000 mL

## 2021-12-27 MED ORDER — DEXTROSE-NACL 5-0.45 % IV SOLN: INTRAVENOUS | Status: DC

## 2021-12-27 MED ORDER — KETOROLAC TROMETHAMINE 15 MG/ML IJ SOLN
INTRAMUSCULAR | Status: AC
  Filled 2021-12-27: qty 1

## 2021-12-27 MED ORDER — CHLORHEXIDINE GLUCONATE 0.12 % MT SOLN: 15.0000 mL | Freq: Once | OROMUCOSAL | Status: AC

## 2021-12-27 MED ORDER — LACTATED RINGERS IV SOLN: INTRAVENOUS | Status: DC | PRN

## 2021-12-27 MED ORDER — CEFAZOLIN SODIUM-DEXTROSE 2-4 GM/100ML-% IV SOLN
2.0000 g | Freq: Three times a day (TID) | INTRAVENOUS | Status: AC
  Administered 2021-12-27: 2 g via INTRAVENOUS
  Filled 2021-12-27: qty 100

## 2021-12-27 MED ORDER — KETAMINE HCL 10 MG/ML IJ SOLN
INTRAMUSCULAR | Status: DC | PRN
  Administered 2021-12-27: 10 mg via INTRAVENOUS
  Administered 2021-12-27: 30 mg via INTRAVENOUS

## 2021-12-27 MED ORDER — PHENYLEPHRINE HCL-NACL 20-0.9 MG/250ML-% IV SOLN
INTRAVENOUS | Status: DC | PRN
  Administered 2021-12-27: 30 ug/min via INTRAVENOUS

## 2021-12-27 MED ORDER — ROCURONIUM BROMIDE 10 MG/ML (PF) SYRINGE
PREFILLED_SYRINGE | INTRAVENOUS | Status: AC
Start: 2021-12-27 — End: ?
  Filled 2021-12-27: qty 10

## 2021-12-27 MED ORDER — ONDANSETRON HCL 4 MG/2ML IJ SOLN
4.0000 mg | Freq: Four times a day (QID) | INTRAMUSCULAR | Status: DC
  Administered 2021-12-27 – 2021-12-29 (×8): 4 mg via INTRAVENOUS
  Filled 2021-12-27 (×8): qty 2

## 2021-12-27 MED ORDER — LACTATED RINGERS IV SOLN: INTRAVENOUS | Status: DC

## 2021-12-27 MED ORDER — MIDAZOLAM HCL 2 MG/2ML IJ SOLN
INTRAMUSCULAR | Status: DC | PRN
  Administered 2021-12-27: 2 mg via INTRAVENOUS

## 2021-12-27 MED ORDER — MORPHINE SULFATE (PF) 2 MG/ML IV SOLN
2.0000 mg | INTRAVENOUS | Status: DC | PRN
  Filled 2021-12-27 (×2): qty 1

## 2021-12-27 MED ORDER — KETOROLAC TROMETHAMINE 15 MG/ML IJ SOLN
15.0000 mg | Freq: Four times a day (QID) | INTRAMUSCULAR | Status: DC | PRN
  Administered 2021-12-27 – 2021-12-29 (×7): 15 mg via INTRAVENOUS
  Filled 2021-12-27 (×8): qty 1

## 2021-12-27 MED ORDER — ONDANSETRON HCL 4 MG/2ML IJ SOLN: 4.0000 mg | Freq: Once | INTRAMUSCULAR | Status: DC | PRN

## 2021-12-27 MED ORDER — MIDAZOLAM HCL 2 MG/2ML IJ SOLN
INTRAMUSCULAR | Status: AC
  Filled 2021-12-27: qty 2

## 2021-12-27 MED ORDER — ACETAMINOPHEN 650 MG RE SUPP
325.0000 mg | RECTAL | Status: DC | PRN
  Administered 2021-12-27: 325 mg via RECTAL
  Filled 2021-12-27: qty 1

## 2021-12-27 MED ORDER — DEXAMETHASONE SODIUM PHOSPHATE 10 MG/ML IJ SOLN
INTRAMUSCULAR | Status: AC
Start: 2021-12-27 — End: ?
  Filled 2021-12-27: qty 1

## 2021-12-27 MED ORDER — PHENYLEPHRINE HCL-NACL 20-0.9 MG/250ML-% IV SOLN
INTRAVENOUS | Status: AC
  Filled 2021-12-27: qty 500

## 2021-12-27 MED ORDER — FENTANYL CITRATE (PF) 250 MCG/5ML IJ SOLN
INTRAMUSCULAR | Status: DC | PRN
  Administered 2021-12-27: 100 ug via INTRAVENOUS
  Administered 2021-12-27: 50 ug via INTRAVENOUS

## 2021-12-27 MED ORDER — PROPOFOL 10 MG/ML IV BOLUS
INTRAVENOUS | Status: DC | PRN
  Administered 2021-12-27: 140 mg via INTRAVENOUS

## 2021-12-27 MED ORDER — ORAL CARE MOUTH RINSE: 15.0000 mL | Freq: Once | OROMUCOSAL | Status: AC

## 2021-12-27 MED ORDER — ROCURONIUM BROMIDE 10 MG/ML (PF) SYRINGE
PREFILLED_SYRINGE | INTRAVENOUS | Status: DC | PRN
  Administered 2021-12-27: 70 mg via INTRAVENOUS
  Administered 2021-12-27: 20 mg via INTRAVENOUS
  Administered 2021-12-27: 30 mg via INTRAVENOUS

## 2021-12-27 MED ORDER — ACETAMINOPHEN 10 MG/ML IV SOLN
INTRAVENOUS | Status: AC
  Filled 2021-12-27: qty 200

## 2021-12-27 MED ORDER — HYDROMORPHONE HCL 1 MG/ML IJ SOLN: 0.2500 mg | INTRAMUSCULAR | Status: DC | PRN

## 2021-12-27 MED ORDER — HYDRALAZINE HCL 20 MG/ML IJ SOLN: 10.0000 mg | Freq: Four times a day (QID) | INTRAMUSCULAR | Status: DC | PRN

## 2021-12-27 MED ORDER — OXYCODONE HCL 5 MG PO TABS: 5.0000 mg | ORAL_TABLET | Freq: Once | ORAL | Status: DC | PRN

## 2021-12-27 MED ORDER — ONDANSETRON HCL 4 MG/2ML IJ SOLN
INTRAMUSCULAR | Status: DC | PRN
  Administered 2021-12-27: 4 mg via INTRAVENOUS

## 2021-12-27 MED ORDER — PHENYLEPHRINE 80 MCG/ML (10ML) SYRINGE FOR IV PUSH (FOR BLOOD PRESSURE SUPPORT)
PREFILLED_SYRINGE | INTRAVENOUS | Status: DC | PRN
  Administered 2021-12-27 (×3): 40 ug via INTRAVENOUS
  Administered 2021-12-27 (×2): 80 ug via INTRAVENOUS
  Administered 2021-12-27: 40 ug via INTRAVENOUS

## 2021-12-27 MED ORDER — PROPOFOL 10 MG/ML IV BOLUS
INTRAVENOUS | Status: AC
  Filled 2021-12-27: qty 20

## 2021-12-27 MED ORDER — DEXAMETHASONE SODIUM PHOSPHATE 10 MG/ML IJ SOLN
INTRAMUSCULAR | Status: DC | PRN
  Administered 2021-12-27: 8 mg via INTRAVENOUS

## 2021-12-27 MED ORDER — OXYCODONE HCL 5 MG/5ML PO SOLN: 5.0000 mg | Freq: Once | ORAL | Status: DC | PRN

## 2021-12-27 MED ORDER — BUPIVACAINE HCL (PF) 0.5 % IJ SOLN
INTRAMUSCULAR | Status: AC
  Filled 2021-12-27: qty 30

## 2021-12-27 MED ORDER — PHENYLEPHRINE 80 MCG/ML (10ML) SYRINGE FOR IV PUSH (FOR BLOOD PRESSURE SUPPORT)
PREFILLED_SYRINGE | INTRAVENOUS | Status: AC
  Filled 2021-12-27: qty 10

## 2021-12-27 MED ORDER — LIDOCAINE 2% (20 MG/ML) 5 ML SYRINGE
INTRAMUSCULAR | Status: AC
Start: 2021-12-27 — End: ?
  Filled 2021-12-27: qty 5

## 2021-12-27 SURGICAL SUPPLY — 101 items
ADH SKN CLS APL DERMABOND .7 (GAUZE/BANDAGES/DRESSINGS) ×2
BLADE CLIPPER SURG (BLADE) ×3 IMPLANT
BLADE SURG 11 STRL SS (BLADE) ×3 IMPLANT
BUTTON OLYMPUS DEFENDO 5 PIECE (MISCELLANEOUS) ×3 IMPLANT
CANISTER SUCT 3000ML PPV (MISCELLANEOUS) ×6 IMPLANT
CANNULA REDUC XI 12-8 STAPL (CANNULA)
CANNULA REDUCER 12-8 DVNC XI (CANNULA) IMPLANT
CATH THORACIC 28FR (CATHETERS) IMPLANT
CNTNR URN SCR LID CUP LEK RST (MISCELLANEOUS) ×2 IMPLANT
CONT SPEC 4OZ STRL OR WHT (MISCELLANEOUS) ×3
COVER BACK TABLE 60X90IN (DRAPES) IMPLANT
DEFOGGER SCOPE WARMER CLEARIFY (MISCELLANEOUS) ×3 IMPLANT
DERMABOND ADVANCED (GAUZE/BANDAGES/DRESSINGS) ×1
DERMABOND ADVANCED .7 DNX12 (GAUZE/BANDAGES/DRESSINGS) ×4 IMPLANT
DEVICE SUTURE ENDOST 10MM (ENDOMECHANICALS) IMPLANT
DRAIN PENROSE 1/2X12 LTX STRL (WOUND CARE) ×3 IMPLANT
DRAPE ARM DVNC X/XI (DISPOSABLE) ×8 IMPLANT
DRAPE COLUMN DVNC XI (DISPOSABLE) ×2 IMPLANT
DRAPE CV SPLIT W-CLR ANES SCRN (DRAPES) ×3 IMPLANT
DRAPE DA VINCI XI ARM (DISPOSABLE) ×12
DRAPE DA VINCI XI COLUMN (DISPOSABLE) ×3
DRAPE INCISE IOBAN 66X45 STRL (DRAPES) IMPLANT
DRAPE ORTHO SPLIT 77X108 STRL (DRAPES) ×3
DRAPE SURG ORHT 6 SPLT 77X108 (DRAPES) ×2 IMPLANT
ELECT REM PT RETURN 9FT ADLT (ELECTROSURGICAL) ×3
ELECTRODE REM PT RTRN 9FT ADLT (ELECTROSURGICAL) ×2 IMPLANT
FELT TEFLON 1X6 (MISCELLANEOUS) IMPLANT
FORCEPS GRASP COMBO 8X230 (FORCEP) ×3 IMPLANT
GAUZE 4X4 16PLY ~~LOC~~+RFID DBL (SPONGE) ×2 IMPLANT
GAUZE SPONGE 4X4 12PLY STRL (GAUZE/BANDAGES/DRESSINGS) ×3 IMPLANT
GLOVE BIO SURGEON STRL SZ7 (GLOVE) ×3 IMPLANT
GLOVE BIO SURGEON STRL SZ7.5 (GLOVE) ×6 IMPLANT
GLOVE SS BIOGEL STRL SZ 6 (GLOVE) IMPLANT
GLOVE SUPERSENSE BIOGEL SZ 6 (GLOVE) ×2
GOWN STRL REUS W/ TWL LRG LVL3 (GOWN DISPOSABLE) ×2 IMPLANT
GOWN STRL REUS W/ TWL XL LVL3 (GOWN DISPOSABLE) ×4 IMPLANT
GOWN STRL REUS W/TWL 2XL LVL3 (GOWN DISPOSABLE) ×3 IMPLANT
GOWN STRL REUS W/TWL LRG LVL3 (GOWN DISPOSABLE) ×3
GOWN STRL REUS W/TWL XL LVL3 (GOWN DISPOSABLE) ×6
GRAFT MYRIAD 3 LAYER 5X5 (Graft) ×1 IMPLANT
GRAFT MYRIAD 5 LAYER 5X5 (Graft) ×1 IMPLANT
GRASPER SUT TROCAR 14GX15 (MISCELLANEOUS) ×1 IMPLANT
HEMOSTAT SURGICEL 2X14 (HEMOSTASIS) IMPLANT
IRRIGATOR SUCT 8 DISP DVNC XI (IRRIGATION / IRRIGATOR) IMPLANT
IRRIGATOR SUCTION 8MM XI DISP (IRRIGATION / IRRIGATOR) ×3
IV NS 1000ML (IV SOLUTION)
IV NS 1000ML BAXH (IV SOLUTION) IMPLANT
KIT BASIN OR (CUSTOM PROCEDURE TRAY) ×3 IMPLANT
KIT TURNOVER KIT B (KITS) ×3 IMPLANT
MARKER SKIN DUAL TIP RULER LAB (MISCELLANEOUS) ×3 IMPLANT
NDL 18GX1X1/2 (RX/OR ONLY) (NEEDLE) IMPLANT
NDL SCLEROTHERAPY 23X2X240 (NEEDLE) IMPLANT
NEEDLE 18GX1X1/2 (RX/OR ONLY) (NEEDLE) IMPLANT
NEEDLE HYPO 22GX1.5 SAFETY (NEEDLE) ×3 IMPLANT
NEEDLE SCLEROTHERAPY 23X2X240 (NEEDLE) IMPLANT
NS IRRIG 1000ML POUR BTL (IV SOLUTION) ×6 IMPLANT
OBTURATOR OPTICAL STANDARD 8MM (TROCAR) ×3
OBTURATOR OPTICAL STND 8 DVNC (TROCAR) ×2
OBTURATOR OPTICALSTD 8 DVNC (TROCAR) IMPLANT
OIL SILICONE PENTAX (PARTS (SERVICE/REPAIRS)) IMPLANT
PACK CHEST (CUSTOM PROCEDURE TRAY) ×3 IMPLANT
PACK UNIVERSAL I (CUSTOM PROCEDURE TRAY) IMPLANT
PAD ARMBOARD 7.5X6 YLW CONV (MISCELLANEOUS) ×6 IMPLANT
PORT ACCESS TROCAR AIRSEAL 12 (TROCAR) ×2 IMPLANT
PORT ACCESS TROCAR AIRSEAL 5M (TROCAR) ×2
SEAL CANN UNIV 5-8 DVNC XI (MISCELLANEOUS) ×8 IMPLANT
SEAL XI 5MM-8MM UNIVERSAL (MISCELLANEOUS) ×12
SEALER SYNCHRO 8 IS4000 DV (MISCELLANEOUS) ×3
SEALER SYNCHRO 8 IS4000 DVNC (MISCELLANEOUS) IMPLANT
SET TRI-LUMEN FLTR TB AIRSEAL (TUBING) ×3 IMPLANT
SHEET MEDIUM DRAPE 40X70 STRL (DRAPES) ×3 IMPLANT
SPIKE FLUID TRANSFER (MISCELLANEOUS) ×2 IMPLANT
SPONGE T-LAP 18X18 ~~LOC~~+RFID (SPONGE) ×8 IMPLANT
STAPLER CANNULA SEAL DVNC XI (STAPLE) IMPLANT
STAPLER CANNULA SEAL XI (STAPLE)
SUT ETHIBOND 0 36 GRN (SUTURE) ×6 IMPLANT
SUT SILK  1 MH (SUTURE) ×3
SUT SILK 1 MH (SUTURE) ×2 IMPLANT
SUT SURGIDAC NAB ES-9 0 48 120 (SUTURE) IMPLANT
SUT VIC AB 3-0 SH 27 (SUTURE) ×6
SUT VIC AB 3-0 SH 27X BRD (SUTURE) ×4 IMPLANT
SUT VIC AB 3-0 X1 27 (SUTURE) ×6 IMPLANT
SUT VICRYL 0 UR6 27IN ABS (SUTURE) ×6 IMPLANT
SUT VLOC 180 2-0 6IN GS21 (SUTURE) ×2 IMPLANT
SYR 10ML LL (SYRINGE) IMPLANT
SYR 20CC LL (SYRINGE) ×6 IMPLANT
SYR 20ML ECCENTRIC (SYRINGE) ×3 IMPLANT
SYR 30ML SLIP (SYRINGE) ×3 IMPLANT
SYSTEM SAHARA CHEST DRAIN ATS (WOUND CARE) IMPLANT
TOWEL GREEN STERILE (TOWEL DISPOSABLE) ×3 IMPLANT
TOWEL GREEN STERILE FF (TOWEL DISPOSABLE) ×3 IMPLANT
TRAY FOLEY MTR SLVR 14FR STAT (SET/KITS/TRAYS/PACK) ×1 IMPLANT
TRAY FOLEY MTR SLVR 16FR STAT (SET/KITS/TRAYS/PACK) ×2 IMPLANT
TRAY WAYNE PNEUMOTHORAX 14X18 (TRAY / TRAY PROCEDURE) ×1 IMPLANT
TROCAR PORT AIRSEAL 8X120 (TROCAR) ×2 IMPLANT
TROCAR XCEL BLADELESS 5X75MML (TROCAR) ×1 IMPLANT
TROCAR XCEL NON-BLD 5MMX100MML (ENDOMECHANICALS) IMPLANT
TUBE CONNECTING 20X1/4 (TUBING) ×3 IMPLANT
TUBING ENDO SMARTCAP (MISCELLANEOUS) ×3 IMPLANT
UNDERPAD 30X36 HEAVY ABSORB (UNDERPADS AND DIAPERS) ×3 IMPLANT
WATER STERILE IRR 1000ML POUR (IV SOLUTION) ×3 IMPLANT

## 2021-12-27 NOTE — Discharge Instructions (Signed)
DIET:  [facility] CONSISTENCY UNTIL YOU SPEAK WITH DR. Cliffton Asters  CONTACT OUR OFFICE AT [phone] if issues arise

## 2021-12-27 NOTE — Brief Op Note (Signed)
12/27/2021  2:38 PM  PATIENT:  Sharmaine Base  61 y.o. female  PRE-OPERATIVE DIAGNOSIS:  PARAESOPHAGEAL HERNIA  POST-OPERATIVE DIAGNOSIS:  PARAESOPHAGEAL HERNIA  PROCEDURE:  Procedure(s): XI ROBOTIC ASSISTED PARAESOPHAGEAL HERNIA REPAIR WITH MESH AND FUNDOPLICATION (N/A) ESOPHAGOGASTRODUODENOSCOPY (EGD) (N/A)  SURGEON:  Surgeon(s) and Role:    * Lightfoot, Eliezer Lofts, MD - Primary  PHYSICIAN ASSISTANT: Nhan Qualley PA-C  ASSISTANTS: none   ANESTHESIA:   none  EBL:  10 mL   BLOOD ADMINISTERED:none  DRAINS: none   LOCAL MEDICATIONS USED:  MARCAINE     SPECIMEN:  No Specimen  DISPOSITION OF SPECIMEN:  N/A  COUNTS:  YES  TOURNIQUET:  * No tourniquets in log *  DICTATION: .Dragon Dictation  PLAN OF CARE: Admit to inpatient   PATIENT DISPOSITION:  PACU - hemodynamically stable.   Delay start of Pharmacological VTE agent (>24hrs) due to surgical blood loss or risk of bleeding: no

## 2021-12-27 NOTE — Anesthesia Procedure Notes (Signed)
Procedure Name: Intubation Date/Time: 12/27/2021 7:48 AM  Performed by: Lajuana Matte, MDPre-anesthesia Checklist: Patient identified Patient Re-evaluated:Patient Re-evaluated prior to induction Oxygen Delivery Method: Circle system utilized Preoxygenation: Pre-oxygenation with 100% oxygen Induction Type: IV induction Ventilation: Mask ventilation without difficulty Laryngoscope Size: Mac and 4 Grade View: Grade I Tube type: Oral Tube size: 7.0 mm Number of attempts: 1 Airway Equipment and Method: Stylet and Oral airway Placement Confirmation: ETT inserted through vocal cords under direct vision, positive ETCO2 and breath sounds checked- equal and bilateral Secured at: 22 cm Tube secured with: Tape Dental Injury: Teeth and Oropharynx as per pre-operative assessment

## 2021-12-27 NOTE — Discharge Summary (Cosign Needed Addendum)
Physician Discharge Summary  Patient ID: AMAZIAH GHOSH MRN: [id number] DOB/AGE: Oct 04, 1960 61 y.o.  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  Due to this she was subsequently referred to Gastroenterology for evaluation.  They performed EGD which evidence of gastric erosions as source of IDA, and she was also found to have a 10 cm large hiatal hernia.  Due to this referral was placed to [facility].  She was evaluated by Dr. Kipp Brood at which time she admitted to experiencing reflux symptoms.  She denies dysphagia and odynophagia.  It was recommended she undergo Robotic Assisted paraesophageal hernia repair.  The risks and benefits of the procedure were explained to the patient and she was agreeable to proceed.  Hospital Course:   Chantale Leugers presented to [facility] on 12/27/21.  She was taken to the operating room and underwent Robotic Assisted Laparoscopic Paraesophageal Hernia Repair and Fundoplication.  She tolerated the procedure, was extubated, and taken to the PACU in stable condition.  Follow up CXR in PACU showed a moderate right pneumothorax.  The patient did well overnight.  Her CXR showed improvement of right pneumothorax.  She had minimal pain.  She did not experience any nausea or vomiting.  Esophogram was performed and showed no evidence of leak, but there was delayed passage of contrast from esophagus to stomach.  She was started on a soft diet which she tolerated without difficulty.  Due to her pneumothorax she was unable to wean from oxygen.  She was observed overnight with repeat CXR showing improvement of pneumothorax.  However her oxygen requirement continued.  She underwent placement of a right sided pigtail catheter.  Follow up CXR showed resolution of pneumothorax.  This was later removed.  Her oxygenation saturations improved and she was able to be weaned off oxygen.  Her surgical incisions are healing without issues.  She does have some mild bruising.  She is ambulating without difficulty.  She is medically stable for discharge home today.  Consults: None  Significant Diagnostic Studies: radiology:  Esophogram  FINDINGS: A problem-oriented water-soluble contrast esophagram was performed to assess for leak or obstruction status post paraesophageal hernia repair and Toupet fundoplication.   There was delayed and intermittent passage of contrast from the distal esophagus into the stomach. Mild intermittent esophageal dysmotility with tertiary contractions. Narrowed appearance of the distal esophagus/GE junction consistent with the provided history of fundoplication. No residual/recurrent hiatal hernia.  No extraluminal contrast was demonstrated to suggest leak.   No significant residual contrast remained within the esophagus  at the completion of the examination.   IMPRESSION: 1. No evidence of leak. 2. No residual/recurrent hiatal hernia. 3. Delayed and intermittent passage of contrast from the distal esophagus into the stomach, possibly due to postoperative edema. 4. Mild intermittent esophageal dysmotility with tertiary contractions.    Electronically Signed   By: Kellie Simmering D.O.   On: 12/28/2021 09:04  Treatments: surgery:   Procedure: - Esophagoscopy - Robotic assisted laparoscopy - Paraesophageal hernia repair with Myriad mesh pledgets - Toupet Fundoplication     Surgeon and Role:      * Lightfoot, Lucile Crater, MD - Primary   Assistant: Leretha Pol, PA-C  An experienced assistant was required given the complexity of this surgery and the standard of surgical care. The assistant was needed for exposure, dissection, suctioning, retraction of delicate tissues and sutures, instrument exchange and for overall help during this procedure.    Discharge Exam: Blood pressure 132/81, pulse 85, temperature 99.3 F (37.4 C), temperature source Oral, resp. rate 16, height 5' 4"  (1.626 m), weight 86.2 kg, last menstrual period 06/19/2012, SpO2 90 %.  General appearance: alert, cooperative, and no distress Heart: regular rate and rhythm Lungs: diminished breath sounds on right Abdomen: soft, non-tender; bowel sounds normal; no masses,  no organomegaly Extremities: extremities normal, atraumatic, no cyanosis or edema Wound: clean and dry, minor ecchymosis present at port sites  Discharge disposition: 01-Home or Self Care  Allergies as of 12/29/2021   No Known Allergies      Medication List     TAKE these medications    esomeprazole 20 MG capsule Commonly known as: NEXIUM Take 20 mg by mouth every other day.   ferrous sulfate 325 (65 FE) MG tablet TAKE 1 TABLET BY MOUTH EVERY DAY   FISH OIL PO Take 1,280 mg by mouth every other day.   multivitamin capsule Take 1 capsule by mouth every  other day.   ondansetron 4 MG tablet Commonly known as: Zofran Take 1 tablet (4 mg total) by mouth every 8 (eight) hours as needed for nausea or vomiting.   QC TUMERIC COMPLEX PO Take 1,500 mg by mouth every other day.   traMADol 50 MG tablet Commonly known as: Ultram Take 1 tablet (50 mg total) by mouth every 6 (six) hours as needed for pain.   valsartan-hydrochlorothiazide 160-25 MG tablet Commonly known as: DIOVAN-HCT TAKE 1/2 TABLET BY MOUTH EVERY DAY        Follow-up Information     Lajuana Matte, MD Follow up on 01/06/2022.   Specialty: Cardiothoracic Surgery Why: Appointment is at 11:20, this is a virtual appointment.  You do not need to come to the office.  Dr. Kipp Brood will call you Contact information: [address] [place] [address] [phone]                 Signed: Ellwood Handler, PA-C  12/30/2021, 8:39 AM

## 2021-12-27 NOTE — Op Note (Signed)
[address]       [place] [address]             [phone]        12/27/2021  Patient:  Marissa Duarte Pre-Op Dx: Paraesophageal Hernia History of Cameron's Ulcers GERD    Post-op Dx:  same Procedure: - Esophagoscopy - Robotic assisted laparoscopy - Paraesophageal hernia repair with Myriad mesh pledgets - Toupet Fundoplication   Surgeon and Role:      * Seiya Silsby, Lucile Crater, MD - Primary  Assistant: Leretha Pol, PA-C  An experienced assistant was required given the complexity of this surgery and the standard of surgical care. The assistant was needed for exposure, dissection, suctioning, retraction of delicate tissues and sutures, instrument exchange and for overall help during this procedure.   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  She also has been treated for iron deficiency anemia.  We discussed the risks and benefits of endoscopy, assisted paraesophageal hernia repair and fundoplication.  She is agreeable to proceed.  Findings: Patulous esophagus.  Stomach in the hiatal hernia  Operative Technique: After the risks, benefits and alternatives were thoroughly discussed, the patient was brought to the operative theatre.  Anesthesia was induced, and the esophagoscope was passed through the oropharynx down to the stomach.  The scope was retroflexed and the hiatal hernia was clearly evident.  The scope was pulled back and the mucosal surface of the esophagus was visualized.    The scope was then parked at 25 cm from the incisors.  The patient was then prepped and draped in normal sterile fashion.  An appropriate surgical pause was performed, and pre-operative antibiotics were dosed accordingly.  We began with a 1 cm incision 15 cm caudad from the xiphoid and slightly lateral to the  umbilicus.  Using an Optiview we entered the peritoneal space.  The abdomen was then insufflated with CO2.  3 other robotic ports were placed to triangulate the hiatus.  Another 12 mm port was placed in place at the level of the umbilicus laterally for an assistant port and another 5 mm trocar was placed in the right lower quadrant for liver retractor.  The patient was then placed in steep reverse Trendelenburg and the liver was elevated to expose the esophageal hiatus.  And then the robot was docked.  We began by dividing the gastrohepatic ligament to expose the right diaphragmatic crus and then dissected the hernia sac in a clockwise fashion to mobilize there the stomach and esophagus.  We then divided the short gastrics and moved towards the right crus and completed our dissection along the esophageal hiatus.  A Penrose drain was then used to encircle the the esophagus and we continued our dissection up into the mediastinum.  Once we had achieved 3 to 4 cm of intra-abdominal esophagus we then proceeded to reapproximate the crura with 0 Ethibond sutures in an interrupted fashion.  The gastroscope was passed down through the lower esophageal sphincter into the stomach and would act as our bougie during this repair.   Next the stomach was passed posterior to the esophagus and Toupet fundoplication was performed.  An air leak test was performed using the gastroscope.  No leak was evident.  The liver retractor was removed and all ports were removed under direct visualization.  The skin and  soft tissue were closed with absorbable suture    The patient tolerated the procedure without any immediate complications, and was transferred to the PACU in stable condition.  Eliza Green Bary Leriche

## 2021-12-27 NOTE — Hospital Course (Addendum)
History of Present Illness:  Marissa Duarte is a 61 yo obese female with history of HTN with recent diagnosis of iron deficiency anemia.  Due to this she was subsequently referred to Gastroenterology for evaluation.  They performed EGD which evidence of gastric erosions as source of IDA, and she was also found to have a 10 cm large hiatal hernia.  Due to this referral was placed to [facility].  She was evaluated by Dr. Cliffton Asters at which time she admitted to experiencing reflux symptoms.  She denies dysphagia and odynophagia.  It was recommended she undergo Robotic Assisted paraesophageal hernia repair.  The risks and benefits of the procedure were explained to the patient and she was agreeable to proceed.  Hospital Course:   Marissa Duarte presented to [facility] on 12/27/21.  She was taken to the operating room and underwent Robotic Assisted Laparoscopic Paraesophageal Hernia Repair and Fundoplication.  She tolerated the procedure, was extubated, and taken to the PACU in stable condition.  Follow up CXR in PACU showed a moderate right pneumothorax.  The patient did well overnight.  Her CXR showed improvement of right pneumothorax.  She had minimal pain.  She did not experience any nausea or vomiting.  Esophogram was performed and showed no evidence of leak, but there was delayed passage of contrast from esophagus to stomach.  She was started on a soft diet which she tolerated without difficulty.  Due to her pneumothorax she was unable to wean from oxygen.  She was observed overnight with repeat CXR showing improvement of pneumothorax.  However her oxygen requirement continued.  She underwent placement of a right sided pigtail catheter.  Follow up CXR showed resolution of pneumothorax.  This was later removed.  Her oxygenation saturations improved and she was able to be weaned off oxygen.  Her surgical incisions are healing without issues.  She does have some mild bruising.  She is  ambulating without difficulty.  She is medically stable for discharge home today.

## 2021-12-27 NOTE — Anesthesia Preprocedure Evaluation (Signed)
Anesthesia Evaluation  Patient identified by MRN, date of birth, ID band Patient awake    Reviewed: Allergy & Precautions, NPO status , Patient's Chart, lab work & pertinent test results  Airway Mallampati: II  TM Distance: >3 FB Neck ROM: Full    Dental no notable dental hx.    Pulmonary neg pulmonary ROS, former smoker,    Pulmonary exam normal breath sounds clear to auscultation       Cardiovascular hypertension, Pt. on medications Normal cardiovascular exam Rhythm:Regular Rate:Normal     Neuro/Psych negative neurological ROS  negative psych ROS   GI/Hepatic Neg liver ROS, hiatal hernia, GERD  Medicated,  Endo/Other  negative endocrine ROS  Renal/GU negative Renal ROS  negative genitourinary   Musculoskeletal negative musculoskeletal ROS (+)   Abdominal   Peds negative pediatric ROS (+)  Hematology negative hematology ROS (+)   Anesthesia Other Findings   Reproductive/Obstetrics negative OB ROS                             Anesthesia Physical Anesthesia Plan  ASA: 2  Anesthesia Plan: General   Post-op Pain Management:    Induction: Intravenous  PONV Risk Score and Plan: 3 and Ondansetron, Dexamethasone and Treatment may vary due to age or medical condition  Airway Management Planned: Oral ETT  Additional Equipment:   Intra-op Plan:   Post-operative Plan: Extubation in OR  Informed Consent: I have reviewed the patients History and Physical, chart, labs and discussed the procedure including the risks, benefits and alternatives for the proposed anesthesia with the patient or authorized representative who has indicated his/her understanding and acceptance.     Dental advisory given  Plan Discussed with: CRNA and Surgeon  Anesthesia Plan Comments:         Anesthesia Quick Evaluation

## 2021-12-27 NOTE — Transfer of Care (Signed)
Immediate Anesthesia Transfer of Care Note  Patient: Marissa Duarte  Procedure(s) Performed: XI ROBOTIC ASSISTED PARAESOPHAGEAL HERNIA REPAIR WITH MESH AND FUNDOPLICATION (Chest) ESOPHAGOGASTRODUODENOSCOPY (EGD)  Patient Location: PACU  Anesthesia Type:General  Level of Consciousness: awake, alert  and patient cooperative  Airway & Oxygen Therapy: Patient Spontanous Breathing and Patient connected to nasal cannula oxygen  Post-op Assessment: Report given to RN, Post -op Vital signs reviewed and stable and Patient moving all extremities  Post vital signs: Reviewed and stable  Last Vitals:  Vitals Value Taken Time  BP 121/72 12/27/21 1104  Temp    Pulse 75 12/27/21 1106  Resp    SpO2 86 % 12/27/21 1106  Vitals shown include unvalidated device data.  Last Pain:  Vitals:   12/27/21 0553  TempSrc:   PainSc: 0-No pain         Complications: No notable events documented.

## 2021-12-27 NOTE — Anesthesia Postprocedure Evaluation (Signed)
Anesthesia Post Note  Patient: Marissa Duarte  Procedure(s) Performed: XI ROBOTIC ASSISTED PARAESOPHAGEAL HERNIA REPAIR WITH MESH AND FUNDOPLICATION (Chest) ESOPHAGOGASTRODUODENOSCOPY (EGD)     Patient location during evaluation: PACU Anesthesia Type: General Level of consciousness: awake and alert Pain management: pain level controlled Vital Signs Assessment: post-procedure vital signs reviewed and stable Respiratory status: spontaneous breathing, nonlabored ventilation, respiratory function stable and patient connected to nasal cannula oxygen Cardiovascular status: blood pressure returned to baseline and stable Postop Assessment: no apparent nausea or vomiting Anesthetic complications: no   No notable events documented.  Last Vitals:  Vitals:   12/27/21 1200 12/27/21 1215  BP: 137/84 133/87  Pulse: 78 78  Resp: (!) 23 16  Temp:    SpO2: 92% 93%    Last Pain:  Vitals:   12/27/21 1200  TempSrc:   PainSc: 0-No pain                 [name] S

## 2021-12-27 NOTE — H&P (Addendum)
History and Physical Interval Note:  12/27/2021 7:28 AM  Marissa Duarte  has presented today for surgery, with the diagnosis of PEH.  The various methods of treatment have been discussed with the patient and family. After consideration of risks, benefits and other options for treatment, the patient has consented to  Procedure(s): XI ROBOTIC ASSISTED PARAESOPHAGEAL HERNIA REPAIR (N/A) ESOPHAGOGASTRODUODENOSCOPY (EGD) (N/A) as a surgical intervention.  The patient's history has been reviewed, patient examined, no change in status, stable for surgery.  I have reviewed the patient's chart and labs.  Questions were answered to the patient's satisfaction.     Vearl Allbaugh O Nevaen Tredway        [address]       [place] [address]             [phone]                                                   MAHLET JERGENS [facility] Medical Record #[id number] Date of Birth: 1960-10-06   Referring: Mayer Masker, PA-C Primary Care: Mayer Masker, PA-C Primary Cardiologist: None   Chief Complaint:   No chief complaint on file.     History of Present Illness:    Marissa Duarte 61 y.o. female referred by Dr. Liborio Nixon for surgical evaluation of a large hiatal hernia.  She recently was evaluated and insomnia, and subsequently for endoscopy which showed a 10 cm hiatal hernia with multiple erosions in the gastric body.   In regards to her anemia she was treated with iron supplements and have some improvement in her levels, so she stopped she became symptomatic.   From a digestive standpoint she denies any significant dysphagia or odynophagia, but does admit to reflux.  Her weight has been stable       Zubrod Score: At the time of surgery this patient's most appropriate activity status/level should be described as: [x]     0    Normal activity, no symptoms []     1    Restricted in physical strenuous activity but ambulatory, able to do out light work []     2    Ambulatory and capable  of self care, unable to do work activities, up and about               >50 % of waking hours                              []     3    Only limited self care, in bed greater than 50% of waking hours []     4    Completely disabled, no self care, confined to bed or chair []     5    Moribund         Past Medical History:  Diagnosis Date   Acid reflux     Esophagitis 3/11     noted on EGD   Hypertension     Kidney stone     Shingles 2021           Past Surgical History:  Procedure Laterality Date   BREAST LUMPECTOMY Left age 65    fibroadenoma   COLONOSCOPY       TUBAL LIGATION   1990   UPPER GASTROINTESTINAL ENDOSCOPY  WISDOM TOOTH EXTRACTION               Family History  Problem Relation Age of Onset   Hypertension Mother     Thyroid disease Mother     Hypertension Father     Polycystic kidney disease Father     Cancer Father          lung   Infertility Sister     Hypertension Sister     Polycystic kidney disease Sister     Healthy Brother     Diabetes Maternal Grandfather     Heart attack Maternal Uncle     Colon cancer Neg Hx     Esophageal cancer Neg Hx     Rectal cancer Neg Hx     Stomach cancer Neg Hx     Colon polyps Neg Hx          Social History        Tobacco Use  Smoking Status Former   Packs/day: 0.50   Years: 20.00   Pack years: 10.00   Types: Cigarettes   Quit date: 06/19/1996   Years since quitting: 25.4  Smokeless Tobacco Never    Social History        Substance and Sexual Activity  Alcohol Use Yes   Alcohol/week: 6.0 standard drinks   Types: 6 Glasses of wine per week    Comment: occasionally        No Known Allergies         Current Outpatient Medications  Medication Sig Dispense Refill   Ascorbic Acid (VITAMIN C) 1000 MG tablet Take 1,000 mg by mouth daily.       Esomeprazole Magnesium (NEXIUM PO) Take by mouth. OTC- every other day       ferrous sulfate 325 (65 FE) MG tablet TAKE 1 TABLET BY MOUTH EVERY DAY 30 tablet  1   Omega-3 Fatty Acids (FISH OIL PO) Take by mouth daily.       Turmeric (QC TUMERIC COMPLEX PO) Take by mouth.       valsartan-hydrochlorothiazide (DIOVAN-HCT) 160-25 MG tablet TAKE 1/2 TABLET BY MOUTH EVERY DAY 45 tablet 1    No current facility-administered medications for this visit.      Review of Systems  Constitutional:  Positive for malaise/fatigue.  Respiratory:  Positive for shortness of breath.   Cardiovascular:  Negative for chest pain.  Gastrointestinal:  Positive for blood in stool.  Neurological:  Positive for dizziness.      PHYSICAL EXAMINATION: LMP 06/19/2012  Physical Exam Constitutional:      General: She is not in acute distress.    Appearance: Normal appearance. She is normal weight. She is not ill-appearing.  Eyes:     Extraocular Movements: Extraocular movements intact.  Cardiovascular:     Rate and Rhythm: Normal rate.  Pulmonary:     Effort: Pulmonary effort is normal. No respiratory distress.  Abdominal:     General: There is no distension.  Musculoskeletal:        General: Normal range of motion.     Cervical back: Normal range of motion.  Skin:    General: Skin is warm and dry.  Neurological:     General: No focal deficit present.     Mental Status: She is alert and oriented to person, place, and time.      Diagnostic Studies & Laboratory data:    CT Scan:     EGD/EUS: 4/23.  Dr. Myrtie Neither - The larynx was  normal. - A 10 cm hiatal hernia was present. The esophagus was foreshortened as a result (EGJ 32 cm from incisors) - There is no endoscopic evidence of esophagitis or stricture in the entire esophagus. - Multiple linear erosions with stigmata of recent bleeding were found in the gastric body (several on both lesser and greater curvatures). Several biopsies were obtained on the greater curvature of the gastric body, on the lesser curvature of the gastric body, on the greater curvature of the gastric antrum and on the lesser curvature of  the gastric antrum with cold forceps for histology. - The exam of the stomach was otherwise normal. - The cardia and gastric fundus were normal on retroflexion. - The examined duodenum was normal.   Path:  Surgical [P], random gastric - BENIGN GASTRIC MUCOSA - NO H. PYLORI, INTESTINAL METAPLASIA OR MALIGNANCY IDENTIFIED           I have independently reviewed the above radiology studies  and reviewed the findings with the patient.    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  She also has been treated for iron deficiency anemia.  We discussed the risks and benefits of endoscopy, assisted paraesophageal hernia repair and fundoplication.  She is agreeable to proceed.

## 2021-12-27 NOTE — Discharge Summary (Incomplete Revision)
Physician Discharge Summary  Patient ID: Marissa Duarte MRN: [id number] DOB/AGE: Oct 10, 1960 61 y.o.  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   Healthcare maintenance 10/30/2017   Anemia 10/30/2017   Fatigue 08/23/2016   Obesity (BMI 35.0-39.9 without comorbidity) 08/23/2016   Essential hypertension, benign 03/05/2014   Discharge Diagnoses:   Patient Active Problem List   Diagnosis Date Noted   Paraesophageal hernia 12/27/2021   S/P repair of paraesophageal hernia 12/27/2021   History of fundoplication 75/03/2584   Former smoker 05/31/2020   Herpes zoster 04/29/2019   Elevated LDL cholesterol level 04/01/2019   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  Due to this she was subsequently referred to Gastroenterology for evaluation.  They performed EGD which evidence of gastric erosions as source of IDA, and she was also found to have a 10 cm large hiatal hernia.  Due to this referral was placed to [facility].  She was evaluated by Dr. Kipp Brood at which time she admitted to experiencing reflux symptoms.  She denies dysphagia and odynophagia.  It was recommended she undergo Robotic Assisted paraesophageal hernia repair.  The risks and benefits of the procedure were explained to the patient and she was agreeable to proceed.  Hospital Course:   Marissa Duarte presented to [facility] on 12/27/21.  She was taken to the operating room and underwent Robotic Assisted Laparoscopic Paraesophageal Hernia Repair and Fundoplication.  She tolerated the procedure, was extubated, and taken to the PACU in stable condition.  Follow up CXR in PACU showed a moderate right pneumothorax.  The patient did well overnight.  Her CXR showed improvement of right pneumothorax.  She had minimal pain.  She did not experience any nausea or vomiting.  Esophogram was performed and showed no evidence of leak, but there was delayed passage of contrast from esophagus to stomach.  She was started on a soft diet which she tolerated without difficulty.  Her surgical incisions are healing without issues.  She does have some mild bruising.  She is ambulating without difficulty.  She is medically stable for discharge home today.  Consults: None  Significant Diagnostic Studies: radiology:  Esophogram  FINDINGS: A problem-oriented water-soluble contrast esophagram was performed to assess for leak or obstruction status post paraesophageal hernia repair and Toupet fundoplication.   There was delayed and intermittent passage of contrast from the distal esophagus into the stomach. Mild intermittent esophageal dysmotility with tertiary contractions. Narrowed appearance of the distal esophagus/GE junction consistent with the provided history of fundoplication. No residual/recurrent hiatal hernia. No extraluminal contrast was demonstrated to suggest leak.   No significant residual contrast remained within the esophagus at the completion of the examination.   IMPRESSION: 1. No evidence of leak. 2. No residual/recurrent hiatal hernia. 3. Delayed and intermittent passage of contrast from the distal esophagus into the stomach, possibly due to postoperative edema. 4. Mild intermittent esophageal dysmotility with tertiary contractions.  Electronically Signed   By: Kellie Simmering D.O.   On: 12/28/2021  09:04  Treatments: surgery:   Procedure: - Esophagoscopy - Robotic assisted laparoscopy - Paraesophageal hernia repair with Myriad mesh pledgets - Toupet Fundoplication     Surgeon and Role:      * Lightfoot, Lucile Crater, MD - Primary   Assistant: Leretha Pol, PA-C  An experienced assistant was required given the complexity of this surgery and the standard of surgical care. The assistant was needed for exposure, dissection, suctioning, retraction of delicate tissues and sutures, instrument exchange and for overall help during this procedure.    Discharge Exam: Blood pressure (!) 143/88, pulse 77, temperature 98.2 F (36.8 C), temperature source Oral, resp. rate 16, height _0  (1.626 m), weight 86.2 kg, last menstrual period 06/19/2012, SpO2 95 %.  General appearance: alert, cooperative, and no distress Heart: regular rate and rhythm Lungs: diminished breath sounds on right Abdomen: soft, non-tender; bowel sounds normal; no masses,  no organomegaly Extremities: extremities normal, atraumatic, no cyanosis or edema Wound: clean and dry, minor ecchymosis present at port sites  Discharge disposition: 01-Home or Self Care  Allergies as of 12/28/2021   No Known Allergies      Medication List     TAKE these medications    esomeprazole 20 MG capsule Commonly known as: NEXIUM Take 20 mg by mouth every other day.   ferrous sulfate 325 (65 FE) MG tablet TAKE 1 TABLET BY MOUTH EVERY DAY   FISH OIL PO Take 1,280 mg by mouth every other day.   multivitamin capsule Take 1 capsule by mouth every other day.   ondansetron 4 MG tablet Commonly known as: Zofran Take 1 tablet (4 mg total) by mouth every 8 (eight) hours as needed for nausea or vomiting.   QC TUMERIC COMPLEX PO Take 1,500 mg by mouth every other day.   traMADol 50 MG tablet Commonly known as: Ultram Take 1 tablet (50 mg total) by mouth every 6 (six) hours as needed for pain.   valsartan-hydrochlorothiazide  160-25 MG tablet Commonly known as: DIOVAN-HCT TAKE 1/2 TABLET BY MOUTH EVERY DAY        Follow-up Information     Marissa Matte, MD Follow up on 01/06/2022.   Specialty: Cardiothoracic Surgery Why: Appointment is at 11:20, this is a virtual appointment.  You do not need to come to the office.  Dr. Kipp Brood will call you Contact information: [address] [place] [address] [phone]                 Signed: Ellwood Handler, PA-C  12/28/2021, 11:38 AM

## 2021-12-27 NOTE — Plan of Care (Signed)
  Problem: Education: Goal: Knowledge of General Education information will improve Description Including pain rating scale, medication(s)/side effects and non-pharmacologic comfort measures Outcome: Progressing   Problem: Health Behavior/Discharge Planning: Goal: Ability to manage health-related needs will improve Outcome: Progressing   Problem: Clinical Measurements: Goal: Ability to maintain clinical measurements within normal limits will improve Outcome: Progressing Goal: Will remain free from infection Outcome: Progressing Goal: Diagnostic test results will improve Outcome: Progressing Goal: Respiratory complications will improve Outcome: Progressing Goal: Cardiovascular complication will be avoided Outcome: Progressing   Problem: Activity: Goal: Risk for activity intolerance will decrease Outcome: Progressing   Problem: Nutrition: Goal: Adequate nutrition will be maintained Outcome: Progressing   Problem: Elimination: Goal: Will not experience complications related to bowel motility Outcome: Progressing Goal: Will not experience complications related to urinary retention Outcome: Progressing   Problem: Pain Managment: Goal: General experience of comfort will improve Outcome: Progressing   

## 2021-12-28 ENCOUNTER — Encounter (HOSPITAL_COMMUNITY): Payer: Self-pay | Admitting: Thoracic Surgery (Cardiothoracic Vascular Surgery)

## 2021-12-28 ENCOUNTER — Other Ambulatory Visit (HOSPITAL_COMMUNITY): Payer: Self-pay

## 2021-12-28 ENCOUNTER — Observation Stay (HOSPITAL_COMMUNITY): Payer: 59

## 2021-12-28 MED ORDER — IOHEXOL 300 MG/ML  SOLN
50.0000 mL | Freq: Once | INTRAMUSCULAR | Status: AC | PRN
  Administered 2021-12-28: 80 mL via ORAL

## 2021-12-28 MED ORDER — ONDANSETRON HCL 4 MG PO TABS
4.0000 mg | ORAL_TABLET | Freq: Three times a day (TID) | ORAL | 0 refills | Status: DC | PRN
  Filled 2021-12-28: qty 20, 7d supply, fill #0

## 2021-12-28 MED ORDER — TRAMADOL HCL 50 MG PO TABS
50.0000 mg | ORAL_TABLET | Freq: Four times a day (QID) | ORAL | 0 refills | Status: DC | PRN
  Filled 2021-12-28: qty 28, 7d supply, fill #0

## 2021-12-28 NOTE — Progress Notes (Signed)
SATURATION QUALIFICATIONS: (This note is used to comply with regulatory documentation for home oxygen)  Patient Saturations on Room Air at Rest = 87%  Patient Saturations on Room Air while Ambulating = 85%  Patient Saturations on 8 Liters of oxygen while Ambulating = 90%

## 2021-12-28 NOTE — Progress Notes (Addendum)
Mobility Specialist Progress Note    12/28/21 1225  Mobility  Activity Ambulated independently in hallway  Level of Assistance Independent  Assistive Device None  Distance Ambulated (ft) 260 ft  Activity Response Tolerated well  $Mobility charge 1 Mobility   Pre-Mobility: 87 HR, 87% SpO2 Post-Mobility: 88 HR, 90% SpO2  Pt received in bed and agreeable. No complaints on walk. Required 8LO2 for SpO2 = 90%. Pt was asymptomatic w/ activity when desatting, maintaining conversation during. Returned to bed with call bell in reach.    [place] Mobility Specialist

## 2021-12-28 NOTE — TOC Progression Note (Signed)
Transition of Care [facility]) - Progression Note    Patient Details  Name: Marissa Duarte MRN: [id number] Date of Birth: 1961-01-13  [facility] of [facility]) CM/SW Contact  Beckie Busing, RN Phone [phone]  12/28/2021, 3:47 PM  Clinical Narrative:    TOC following patient. Per mobility note patient will qualify for home O2. MD will need to enter home O2 orders. Patient had d/c order in but that order has been discontinued. TOC will follow for disposition needs.         Expected Discharge Plan and Services           Expected Discharge Date: 12/28/21                                     Social Determinants of Health (SDOH) Interventions    Readmission Risk Interventions     No data to display

## 2021-12-28 NOTE — Progress Notes (Addendum)
      [address]       [place] [address]             [phone]       1 Day Post-Op Procedure(s) (LRB): XI ROBOTIC ASSISTED PARAESOPHAGEAL HERNIA REPAIR WITH MESH AND FUNDOPLICATION (N/A) ESOPHAGOGASTRODUODENOSCOPY (EGD) (N/A)  Subjective:  Patient doing well.  She denies N/V.  Has some mild pain, but its not bad.  Passing some gas.  Objective: Vital signs in last 24 hours: Temp:  [97.6 F (36.4 C)-99.5 F (37.5 C)] 98.4 F (36.9 C) (07/12 0305) Pulse Rate:  [69-99] 80 (07/12 0305) Cardiac Rhythm: Normal sinus rhythm (07/12 0702) Resp:  [14-23] 19 (07/12 0305) BP: (113-145)/(64-92) 117/71 (07/12 0305) SpO2:  [90 %-94 %] 93 % (07/12 0305)  Intake/Output from previous day: 07/11 0701 - 07/12 0700 In: 1400.1 [I.V.:950.1; IV Piggyback:450] Out: 660 [Urine:650; Blood:10]  General appearance: alert, cooperative, and no distress Heart: regular rate and rhythm Lungs: diminished breath sounds on right Abdomen: soft, non-tender; bowel sounds normal; no masses,  no organomegaly Extremities: extremities normal, atraumatic, no cyanosis or edema Wound: clean and dry, minor ecchymosis present at port sites  Lab Results: Recent Labs    12/27/21 1510  WBC 12.5*  HGB 13.7  HCT 40.8  PLT 206   BMET: No results for input(s): "NA", "K", "CL", "CO2", "GLUCOSE", "BUN", "CREATININE", "CALCIUM" in the last 72 hours.  PT/INR: No results for input(s): "LABPROT", "INR" in the last 72 hours. ABG No results found for: "PHART", "HCO3", "TCO2", "ACIDBASEDEF", "O2SAT" CBG (last 3)  No results for input(s): "GLUCAP" in the last 72 hours.  Assessment/Plan: S/P Procedure(s) (LRB): XI ROBOTIC ASSISTED PARAESOPHAGEAL HERNIA REPAIR WITH MESH AND FUNDOPLICATION (N/A) ESOPHAGOGASTRODUODENOSCOPY (EGD) (N/A)  CV- maintaining NSR, BP stable- continue prn Hydralazine for now Right pneumothorax- decreased in size from yesterday, wean oxygen as tolerated, will monitor GI-  remains NPO, for swallow study this morning, will advance diet pending results, continue scheduled zofran to avoid N/V Lovenox for DVT prophylaxis Dispo- patient stable, for esophogram today, will advance diet pending those results, pneumothorax decreased in size, will discuss dispo planning with Dr. Cliffton Asters for this afternoon vs. Tomorrow morning   LOS: 0 days   Lowella Dandy, PA-C 12/28/2021  Agree with above. Swallow study is negative for any leak.  There was some delay in emptying of the esophagus due to swelling.  She has been advanced to dysphagia diet. She does have a moderate sized pneumothorax, and desatted with ambulation.-Encourage more incentive spirometry and ambulation.  We will watch her for another 24 hours prior to discharge.  Pasha Gadison Keane Scrape

## 2021-12-28 NOTE — Plan of Care (Signed)

## 2021-12-29 ENCOUNTER — Inpatient Hospital Stay (HOSPITAL_COMMUNITY): Payer: 59

## 2021-12-29 ENCOUNTER — Observation Stay (HOSPITAL_COMMUNITY): Payer: 59

## 2021-12-29 ENCOUNTER — Other Ambulatory Visit: Payer: Self-pay | Admitting: Physician Assistant

## 2021-12-29 DIAGNOSIS — Z801 Family history of malignant neoplasm of trachea, bronchus and lung: Secondary | ICD-10-CM | POA: Diagnosis not present

## 2021-12-29 DIAGNOSIS — Z6832 Body mass index (BMI) 32.0-32.9, adult: Secondary | ICD-10-CM | POA: Diagnosis not present

## 2021-12-29 DIAGNOSIS — Z8249 Family history of ischemic heart disease and other diseases of the circulatory system: Secondary | ICD-10-CM | POA: Diagnosis not present

## 2021-12-29 DIAGNOSIS — E669 Obesity, unspecified: Secondary | ICD-10-CM | POA: Diagnosis present

## 2021-12-29 DIAGNOSIS — D509 Iron deficiency anemia, unspecified: Secondary | ICD-10-CM | POA: Diagnosis present

## 2021-12-29 DIAGNOSIS — K449 Diaphragmatic hernia without obstruction or gangrene: Secondary | ICD-10-CM | POA: Diagnosis present

## 2021-12-29 DIAGNOSIS — K219 Gastro-esophageal reflux disease without esophagitis: Secondary | ICD-10-CM | POA: Diagnosis present

## 2021-12-29 DIAGNOSIS — J939 Pneumothorax, unspecified: Secondary | ICD-10-CM | POA: Diagnosis not present

## 2021-12-29 DIAGNOSIS — Z833 Family history of diabetes mellitus: Secondary | ICD-10-CM | POA: Diagnosis not present

## 2021-12-29 DIAGNOSIS — Z87891 Personal history of nicotine dependence: Secondary | ICD-10-CM | POA: Diagnosis not present

## 2021-12-29 DIAGNOSIS — I10 Essential (primary) hypertension: Secondary | ICD-10-CM | POA: Diagnosis present

## 2021-12-29 MED ORDER — POTASSIUM CHLORIDE CRYS ER 20 MEQ PO TBCR
20.0000 meq | EXTENDED_RELEASE_TABLET | Freq: Every day | ORAL | Status: DC
  Administered 2021-12-29: 20 meq via ORAL
  Filled 2021-12-29: qty 1

## 2021-12-29 MED ORDER — FUROSEMIDE 10 MG/ML IJ SOLN
40.0000 mg | Freq: Once | INTRAMUSCULAR | Status: AC
  Administered 2021-12-29: 40 mg via INTRAVENOUS
  Filled 2021-12-29: qty 4

## 2021-12-29 MED ORDER — IRBESARTAN 150 MG PO TABS
150.0000 mg | ORAL_TABLET | Freq: Every day | ORAL | Status: DC
  Administered 2021-12-29: 150 mg via ORAL
  Filled 2021-12-29: qty 1

## 2021-12-29 NOTE — Progress Notes (Addendum)
      [address]       [place] [address]             [phone]      2 Days Post-Op Procedure(s) (LRB): XI ROBOTIC ASSISTED PARAESOPHAGEAL HERNIA REPAIR WITH MESH AND FUNDOPLICATION (N/A) ESOPHAGOGASTRODUODENOSCOPY (EGD) (N/A)  Subjective:  Patient continues to be w/o complaints.  She denies pain, shortness of breath.  + ambulation  Objective: Vital signs in last 24 hours: Temp:  [98.1 F (36.7 C)-99.5 F (37.5 C)] 99.5 F (37.5 C) (07/13 0401) Pulse Rate:  [75-98] 98 (07/13 0401) Cardiac Rhythm: Normal sinus rhythm (07/13 0700) Resp:  [16-19] 19 (07/13 0401) BP: (113-145)/(65-88) 124/70 (07/13 0401) SpO2:  [92 %-95 %] 93 % (07/13 0401)  General appearance: alert, cooperative, and no distress Heart: regular rate and rhythm Lungs: diminished on right Abdomen: soft, non-tender; bowel sounds normal; no masses,  no organomegaly Extremities: extremities normal, atraumatic, no cyanosis or edema Wound: clean and dry  Lab Results: Recent Labs    12/27/21 1510  WBC 12.5*  HGB 13.7  HCT 40.8  PLT 206   BMET: No results for input(s): "NA", "K", "CL", "CO2", "GLUCOSE", "BUN", "CREATININE", "CALCIUM" in the last 72 hours.  PT/INR: No results for input(s): "LABPROT", "INR" in the last 72 hours. ABG No results found for: "PHART", "HCO3", "TCO2", "ACIDBASEDEF", "O2SAT" CBG (last 3)  No results for input(s): "GLUCAP" in the last 72 hours.  Assessment/Plan: S/P Procedure(s) (LRB): XI ROBOTIC ASSISTED PARAESOPHAGEAL HERNIA REPAIR WITH MESH AND FUNDOPLICATION (N/A) ESOPHAGOGASTRODUODENOSCOPY (EGD) (N/A)  CV- maintaining NSR, H/O HTN- will restart home valsartan Pulm- unable to wean off oxygen, CXR with continued pneumothorax, but improving.. continue IS, repeat CXR in AM Renal- patient received IV fluids post operatively, minor edema on exam, will give IV Lasix, potassium GI- continue dysphagia 1 diet Lovenox for DVT prophylaxis Dispo- patient stable,  continue to wean oxygen as tolerated, pneumothorax improving can hopefully avoid chest tube placement, diurese this morning, once off oxygen can plan for discharge   LOS: 0 days    Lowella Dandy, PA-C 12/29/2021  Will place pigtail today Possible home today  Freddie Dymek O Colleena Kurtenbach

## 2021-12-29 NOTE — Progress Notes (Signed)
SATURATION QUALIFICATIONS: (This note is used to comply with regulatory documentation for home oxygen)  Patient Saturations on Room Air at Rest = 96  Patient Saturations on Room Air while Ambulating = 94   

## 2021-12-29 NOTE — Progress Notes (Signed)
Mobility Specialist Progress Note    12/29/21 1702  Mobility  Activity Ambulated independently in hallway  Level of Assistance Independent  Assistive Device None  Distance Ambulated (ft) 350 ft  Activity Response Tolerated well  $Mobility charge 1 Mobility   Post-Mobility: 105 HR, 96% SpO2  Pt received in bed and agreeable. No complaints on walk. Able to maintain SpO2 in 90s on RA. Returned to bed with call bell in reach.    [place] Mobility Specialist

## 2021-12-29 NOTE — Progress Notes (Signed)
50 ml of bright red blood expelled from heimlich valve during first ambulation after chest tube insertion. PA Erin aware and Chest X-ray ordered. Patient is asymptomatic. Will continue to monitor.

## 2021-12-29 NOTE — Procedures (Signed)
Insertion of Chest Tube Procedure Note  KHYA HALLS  [id number]  [birth year]  Date:12/29/21  Time:10:15 AM    Provider Performing: Corliss Skains   Procedure: Chest Tube Insertion (32551)  Indication(s) Pneumothorax  Consent Risks of the procedure as well as the alternatives and risks of each were explained to the patient and/or caregiver.  Consent for the procedure was obtained and is signed in the bedside chart  Anesthesia Topical only with 1% lidocaine    Time Out Verified patient identification, verified procedure, site/side was marked, verified correct patient position, special equipment/implants available, medications/allergies/relevant history reviewed, required imaging and test results available.   Sterile Technique Maximal sterile technique including full sterile barrier drape, hand hygiene, sterile gown, sterile gloves, mask, hair covering, sterile ultrasound probe cover (if used).   Procedure Description Ultrasound not used to identify appropriate pleural anatomy for placement and overlying skin marked. Area of placement cleaned and draped in sterile fashion.  A 14 French pigtail pleural catheter was placed into the right pleural space using Seldinger technique. Appropriate return of air was obtained.  The tube was connected to atrium and placed on -20 cm H2O wall suction.   Complications/Tolerance None; patient tolerated the procedure well. Chest X-ray is ordered to verify placement.   EBL Minimal  Specimen(s) none

## 2021-12-30 ENCOUNTER — Other Ambulatory Visit: Payer: Self-pay

## 2021-12-30 NOTE — Patient Outreach (Signed)
  Care Coordination TOC Note Transition Care Management Unsuccessful Follow-up Telephone Call  Date of discharge and from where:  12/29/21 [place]  Attempts:  1st Attempt  Reason for unsuccessful TCM follow-up call:  Left voice message  Nawaf Strange RN, BSN,CCM, CDE Care Management Coordinator [facility] Care Management [phone]   

## 2021-12-31 ENCOUNTER — Other Ambulatory Visit: Payer: Self-pay | Admitting: Physician Assistant

## 2021-12-31 DIAGNOSIS — D649 Anemia, unspecified: Secondary | ICD-10-CM

## 2021-12-31 LAB — BPAM RBC
Blood Product Expiration Date: 202307272359
Blood Product Expiration Date: 202307272359
Unit Type and Rh: 9500
Unit Type and Rh: 9500

## 2021-12-31 LAB — TYPE AND SCREEN
ABO/RH(D): O NEG
Antibody Screen: POSITIVE
Unit division: 0
Unit division: 0

## 2022-01-02 ENCOUNTER — Encounter (HOSPITAL_COMMUNITY): Payer: Self-pay | Admitting: Thoracic Surgery (Cardiothoracic Vascular Surgery)

## 2022-01-02 ENCOUNTER — Other Ambulatory Visit: Payer: Self-pay

## 2022-01-02 NOTE — Patient Outreach (Signed)
  Care Coordination [facility] Note Transition Care Management Follow-up Telephone Call Date of discharge and from where: 12/29/21 [facility] How have you been since you were released from the hospital? Feeling good but a little tired Any questions or concerns? No  Items Reviewed: Did the pt receive and understand the discharge instructions provided? Yes  Medications obtained and verified? Yes  Other? No  Any new allergies since your discharge? No  Dietary orders reviewed? Yes Do you have support at home? Yes   Home Care and Equipment/Supplies: Were home health services ordered? no If so, what is the name of the agency? N/A  Has the agency set up a time to come to the patient's home? not applicable Were any new equipment or medical supplies ordered?  No What is the name of the medical supply agency? N/A Were you able to get the supplies/equipment? not applicable Do you have any questions related to the use of the equipment or supplies? No  Functional Questionnaire: (I = Independent and D = Dependent) ADLs: I  Bathing/Dressing- I  Meal Prep- I  Eating- I  Maintaining continence- I  Transferring/Ambulation- I  Managing Meds- I  Follow up appointments reviewed:  PCP Hospital f/u appt confirmed?  None required  to see specialist Specialist Hospital f/u appt confirmed? Yes  Scheduled to see Dr. Cliffton Asters on 01/06/22 @ 1:20 PM. Are transportation arrangements needed? No  If their condition worsens, is the pt aware to call PCP or go to the Emergency Dept.? Yes Was the patient provided with contact information for the PCP's office or ED? Yes Was to pt encouraged to call back with questions or concerns? Yes  SDOH assessments and interventions completed:   Yes  Care Coordination Interventions Activated:  Yes Care Coordination Interventions:   Reviewed to attend follow up appointment  Encounter Outcome:  Pt. Visit Completed Dudley Major RN, BSN,CCM, CDE Care Management  Coordinator [facility] Care Management [phone]

## 2022-01-06 ENCOUNTER — Ambulatory Visit (INDEPENDENT_AMBULATORY_CARE_PROVIDER_SITE_OTHER): Payer: Self-pay | Admitting: Thoracic Surgery (Cardiothoracic Vascular Surgery)

## 2022-01-06 DIAGNOSIS — K449 Diaphragmatic hernia without obstruction or gangrene: Secondary | ICD-10-CM

## 2022-01-06 NOTE — Progress Notes (Signed)
     [address]       [place] [address]             [phone]       Patient: Home Provider: Office Consent for Telemedicine visit obtained.  Today's visit was completed via a real-time telehealth (see specific modality noted below). The patient/authorized person provided oral consent at the time of the visit to engage in a telemedicine encounter with the present provider at [facility]. The patient/authorized person was informed of the potential benefits, limitations, and risks of telemedicine. The patient/authorized person expressed understanding that the laws that protect confidentiality also apply to telemedicine. The patient/authorized person acknowledged understanding that telemedicine does not provide emergency services and that he or she would need to call 911 or proceed to the nearest hospital for help if such a need arose.   Total time spent in the clinical discussion 10 minutes.  Telehealth Modality: Phone visit (audio only)  I had a telephone visit with Mrs. Kirkeby.  She is s/p PEH repair.  She is doing well.  She is tolerating her diet without difficulty.  She denies any reflux.    She can slowly advance her diet.  She will meet with her PCP to discuss d/c of her iron supplements after she gets some labs.  She will f/u in 1 month with a CXR  Ander Wamser O Gerasimos Plotts

## 2022-01-17 ENCOUNTER — Other Ambulatory Visit: Payer: Self-pay | Admitting: Physician Assistant

## 2022-01-17 DIAGNOSIS — I10 Essential (primary) hypertension: Secondary | ICD-10-CM

## 2022-02-03 ENCOUNTER — Other Ambulatory Visit: Payer: 59

## 2022-02-03 DIAGNOSIS — Z130 Encounter for screening for diseases of the blood and blood-forming organs and certain disorders involving the immune mechanism: Secondary | ICD-10-CM

## 2022-02-03 DIAGNOSIS — D509 Iron deficiency anemia, unspecified: Secondary | ICD-10-CM

## 2022-02-03 DIAGNOSIS — Z0000 Encounter for general adult medical examination without abnormal findings: Secondary | ICD-10-CM

## 2022-02-04 LAB — CBC WITH DIFFERENTIAL/PLATELET
Basophils Absolute: 0 10*3/uL (ref 0.0–0.2)
Basos: 1 %
EOS (ABSOLUTE): 0.2 10*3/uL (ref 0.0–0.4)
Eos: 5 %
Hematocrit: 42.3 % (ref 34.0–46.6)
Hemoglobin: 13.8 g/dL (ref 11.1–15.9)
Immature Grans (Abs): 0 10*3/uL (ref 0.0–0.1)
Immature Granulocytes: 0 %
Lymphocytes Absolute: 1.1 10*3/uL (ref 0.7–3.1)
Lymphs: 27 %
MCH: 30.2 pg (ref 26.6–33.0)
MCHC: 32.6 g/dL (ref 31.5–35.7)
MCV: 93 fL (ref 79–97)
Monocytes Absolute: 0.4 10*3/uL (ref 0.1–0.9)
Monocytes: 10 %
Neutrophils Absolute: 2.3 10*3/uL (ref 1.4–7.0)
Neutrophils: 57 %
Platelets: 194 10*3/uL (ref 150–450)
RBC: 4.57 x10E6/uL (ref 3.77–5.28)
RDW: 13.3 % (ref 11.7–15.4)
WBC: 4 10*3/uL (ref 3.4–10.8)

## 2022-02-04 LAB — LIPID PANEL
Chol/HDL Ratio: 3.5 ratio (ref 0.0–4.4)
Cholesterol, Total: 194 mg/dL (ref 100–199)
HDL: 56 mg/dL (ref >39)
LDL Chol Calc (NIH): 115 mg/dL — ABNORMAL HIGH (ref 0–99)
Triglycerides: 130 mg/dL (ref 0–149)
VLDL Cholesterol Cal: 23 mg/dL (ref 5–40)

## 2022-02-04 LAB — COMPREHENSIVE METABOLIC PANEL
ALT: 15 IU/L (ref 0–32)
AST: 20 IU/L (ref 0–40)
Albumin/Globulin Ratio: 2 (ref 1.2–2.2)
Albumin: 4.2 g/dL (ref 3.9–4.9)
Alkaline Phosphatase: 66 IU/L (ref 44–121)
BUN/Creatinine Ratio: 13 (ref 12–28)
BUN: 10 mg/dL (ref 8–27)
Bilirubin Total: 0.6 mg/dL (ref 0.0–1.2)
CO2: 27 mmol/L (ref 20–29)
Calcium: 9.2 mg/dL (ref 8.7–10.3)
Chloride: 101 mmol/L (ref 96–106)
Creatinine, Ser: 0.75 mg/dL (ref 0.57–1.00)
Globulin, Total: 2.1 g/dL (ref 1.5–4.5)
Glucose: 92 mg/dL (ref 70–99)
Potassium: 4.3 mmol/L (ref 3.5–5.2)
Sodium: 141 mmol/L (ref 134–144)
Total Protein: 6.3 g/dL (ref 6.0–8.5)
eGFR: 89 mL/min/{1.73_m2} (ref >59)

## 2022-02-04 LAB — IRON,TIBC AND FERRITIN PANEL
Ferritin: 25 ng/mL (ref 15–150)
Iron Saturation: 25 % (ref 15–55)
Iron: 91 ug/dL (ref 27–139)
Total Iron Binding Capacity: 371 ug/dL (ref 250–450)
UIBC: 280 ug/dL (ref 118–369)

## 2022-02-04 LAB — HEMOGLOBIN A1C
Est. average glucose Bld gHb Est-mCnc: 114 mg/dL
Hgb A1c MFr Bld: 5.6 % (ref 4.8–5.6)

## 2022-02-04 LAB — TSH: TSH: 1.14 u[IU]/mL (ref 0.450–4.500)

## 2022-02-07 ENCOUNTER — Ambulatory Visit (INDEPENDENT_AMBULATORY_CARE_PROVIDER_SITE_OTHER): Payer: 59 | Admitting: Physician Assistant

## 2022-02-07 ENCOUNTER — Encounter: Payer: Self-pay | Admitting: Physician Assistant

## 2022-02-07 VITALS — BP 118/80 | HR 84 | Temp 97.7°F | Ht 64.0 in | Wt 187.0 lb

## 2022-02-07 DIAGNOSIS — Z0000 Encounter for general adult medical examination without abnormal findings: Secondary | ICD-10-CM

## 2022-02-07 NOTE — Patient Instructions (Signed)
Preventive Care 40-64 Years Old, Female Preventive care refers to lifestyle choices and visits with your health care provider that can promote health and wellness. Preventive care visits are also called wellness exams. What can I expect for my preventive care visit? Counseling Your health care provider may ask you questions about your: Medical history, including: Past medical problems. Family medical history. Pregnancy history. Current health, including: Menstrual cycle. Method of birth control. Emotional well-being. Home life and relationship well-being. Sexual activity and sexual health. Lifestyle, including: Alcohol, nicotine or tobacco, and drug use. Access to firearms. Diet, exercise, and sleep habits. Work and work environment. Sunscreen use. Safety issues such as seatbelt and bike helmet use. Physical exam Your health care provider will check your: Height and weight. These may be used to calculate your BMI (body mass index). BMI is a measurement that tells if you are at a healthy weight. Waist circumference. This measures the distance around your waistline. This measurement also tells if you are at a healthy weight and may help predict your risk of certain diseases, such as type 2 diabetes and high blood pressure. Heart rate and blood pressure. Body temperature. Skin for abnormal spots. What immunizations do I need?  Vaccines are usually given at various ages, according to a schedule. Your health care provider will recommend vaccines for you based on your age, medical history, and lifestyle or other factors, such as travel or where you work. What tests do I need? Screening Your health care provider may recommend screening tests for certain conditions. This may include: Lipid and cholesterol levels. Diabetes screening. This is done by checking your blood sugar (glucose) after you have not eaten for a while (fasting). Pelvic exam and Pap test. Hepatitis B test. Hepatitis C  test. HIV (human immunodeficiency virus) test. STI (sexually transmitted infection) testing, if you are at risk. Lung cancer screening. Colorectal cancer screening. Mammogram. Talk with your health care provider about when you should start having regular mammograms. This may depend on whether you have a family history of breast cancer. BRCA-related cancer screening. This may be done if you have a family history of breast, ovarian, tubal, or peritoneal cancers. Bone density scan. This is done to screen for osteoporosis. Talk with your health care provider about your test results, treatment options, and if necessary, the need for more tests. Follow these instructions at home: Eating and drinking  Eat a diet that includes fresh fruits and vegetables, whole grains, lean protein, and low-fat dairy products. Take vitamin and mineral supplements as recommended by your health care provider. Do not drink alcohol if: Your health care provider tells you not to drink. You are pregnant, may be pregnant, or are planning to become pregnant. If you drink alcohol: Limit how much you have to 0-1 drink a day. Know how much alcohol is in your drink. In the [place] one drink equals one 12 oz bottle of beer (355 mL), one 5 oz glass of wine (148 mL), or one 1 oz glass of hard liquor (44 mL). Lifestyle Brush your teeth every morning and night with fluoride toothpaste. Floss one time each day. Exercise for at least 30 minutes 5 or more days each week. Do not use any products that contain nicotine or tobacco. These products include cigarettes, chewing tobacco, and vaping devices, such as e-cigarettes. If you need help quitting, ask your health care provider. Do not use drugs. If you are sexually active, practice safe sex. Use a condom or other form of protection to   prevent STIs. If you do not wish to become pregnant, use a form of birth control. If you plan to become pregnant, see your health care provider for a  prepregnancy visit. Take aspirin only as told by your health care provider. Make sure that you understand how much to take and what form to take. Work with your health care provider to find out whether it is safe and beneficial for you to take aspirin daily. Find healthy ways to manage stress, such as: Meditation, yoga, or listening to music. Journaling. Talking to a trusted person. Spending time with friends and family. Minimize exposure to UV radiation to reduce your risk of skin cancer. Safety Always wear your seat belt while driving or riding in a vehicle. Do not drive: If you have been drinking alcohol. Do not ride with someone who has been drinking. When you are tired or distracted. While texting. If you have been using any mind-altering substances or drugs. Wear a helmet and other protective equipment during sports activities. If you have firearms in your house, make sure you follow all gun safety procedures. Seek help if you have been physically or sexually abused. What's next? Visit your health care provider once a year for an annual wellness visit. Ask your health care provider how often you should have your eyes and teeth checked. Stay up to date on all vaccines. This information is not intended to replace advice given to you by your health care provider. Make sure you discuss any questions you have with your health care provider. Document Revised: 12/01/2020 Document Reviewed: 12/01/2020 Elsevier Patient Education  [place].

## 2022-02-07 NOTE — Progress Notes (Signed)
Complete physical exam   Patient: Marissa Duarte   DOB: [birth year]   61 y.o. Female  MRN: [id number] Visit Date: 02/07/2022   Chief Complaint  Patient presents with   Annual Exam   Subjective    YISSEL Duarte is a 61 y.o. female who presents today for a complete physical exam.  She reports consuming a general diet.  Trying to walk as much as possible.  She generally feels fairly well. She does not have additional problems to discuss today.      Past Medical History:  Diagnosis Date   Acid reflux    Esophagitis 08/2009   noted on EGD   History of hiatal hernia    Hypertension    Kidney stone    Shingles 2021   Past Surgical History:  Procedure Laterality Date   BREAST LUMPECTOMY Left age 55   fibroadenoma   COLONOSCOPY     ESOPHAGOGASTRODUODENOSCOPY N/A 12/27/2021   Procedure: ESOPHAGOGASTRODUODENOSCOPY (EGD);  Surgeon: Lajuana Matte, MD;  Location: [facility];  Service: Thoracic;  Laterality: N/A;   TUBAL LIGATION  1990   UPPER GASTROINTESTINAL ENDOSCOPY     WISDOM TOOTH EXTRACTION     XI ROBOTIC ASSISTED PARAESOPHAGEAL HERNIA REPAIR N/A 12/27/2021   Procedure: XI ROBOTIC ASSISTED PARAESOPHAGEAL HERNIA REPAIR WITH MESH AND FUNDOPLICATION;  Surgeon: Lajuana Matte, MD;  Location: [facility];  Service: Thoracic;  Laterality: N/A;   Social History   Socioeconomic History   Marital status: Married    Spouse name: Not on file   Number of children: 2   Years of education: Not on file   Highest education level: Not on file  Occupational History   Occupation: Retired  Tobacco Use   Smoking status: Former    Packs/day: 0.50    Years: 20.00    Total pack years: 10.00    Types: Cigarettes    Quit date: 06/19/1996    Years since quitting: 25.6   Smokeless tobacco: Never  Vaping Use   Vaping Use: Never used  Substance and Sexual Activity   Alcohol use: Yes    Alcohol/week: 6.0 standard drinks of alcohol    Types: 6 Glasses of wine per week    Comment:  occasionally   Drug use: No   Sexual activity: Yes    Partners: Male    Birth control/protection: Surgical    Comment: BTL  Other Topics Concern   Not on file  Social History Narrative   Not on file   Social Determinants of Health   Financial Resource Strain: Not on file  Food Insecurity: Not on file  Transportation Needs: No Transportation Needs (01/02/2022)   PRAPARE - Hydrologist (Medical): No    Lack of Transportation (Non-Medical): No  Physical Activity: Not on file  Stress: Not on file  Social Connections: Not on file  Intimate Partner Violence: Not on file     Medications: Outpatient Medications Prior to Visit  Medication Sig   Multiple Vitamin (MULTIVITAMIN) capsule Take 1 capsule by mouth every other day.   Omega-3 Fatty Acids (FISH OIL PO) Take 1,280 mg by mouth every other day.   Turmeric (QC TUMERIC COMPLEX PO) Take 1,500 mg by mouth every other day.   valsartan-hydrochlorothiazide (DIOVAN-HCT) 160-25 MG tablet TAKE 1/2 TABLET BY MOUTH EVERY DAY   [DISCONTINUED] esomeprazole (NEXIUM) 20 MG capsule Take 20 mg by mouth every other day.   [DISCONTINUED] ferrous sulfate 325 (65 FE) MG tablet TAKE  1 TABLET BY MOUTH EVERY DAY   [DISCONTINUED] ondansetron (ZOFRAN) 4 MG tablet Take 1 tablet (4 mg total) by mouth every 8 (eight) hours as needed for nausea or vomiting.   [DISCONTINUED] traMADol (ULTRAM) 50 MG tablet Take 1 tablet (50 mg total) by mouth every 6 (six) hours as needed for pain.   No facility-administered medications prior to visit.    Review of Systems Review of Systems:  A fourteen system review of systems was performed and found to be positive as per HPI.  Last CBC Lab Results  Component Value Date   WBC 4.0 02/03/2022   HGB 13.8 02/03/2022   HCT 42.3 02/03/2022   MCV 93 02/03/2022   MCH 30.2 02/03/2022   RDW 13.3 02/03/2022   PLT 194 76/16/0737   Last metabolic panel Lab Results  Component Value Date   GLUCOSE  92 02/03/2022   NA 141 02/03/2022   K 4.3 02/03/2022   CL 101 02/03/2022   CO2 27 02/03/2022   BUN 10 02/03/2022   CREATININE 0.75 02/03/2022   EGFR 89 02/03/2022   CALCIUM 9.2 02/03/2022   PROT 6.3 02/03/2022   ALBUMIN 4.2 02/03/2022   LABGLOB 2.1 02/03/2022   AGRATIO 2.0 02/03/2022   BILITOT 0.6 02/03/2022   ALKPHOS 66 02/03/2022   AST 20 02/03/2022   ALT 15 02/03/2022   ANIONGAP 8 12/23/2021   Last lipids Lab Results  Component Value Date   CHOL 194 02/03/2022   HDL 56 02/03/2022   LDLCALC 115 (H) 02/03/2022   TRIG 130 02/03/2022   CHOLHDL 3.5 02/03/2022   Last hemoglobin A1c Lab Results  Component Value Date   HGBA1C 5.6 02/03/2022   Last thyroid functions Lab Results  Component Value Date   TSH 1.140 02/03/2022   Last vitamin D Lab Results  Component Value Date   VD25OH 46.9 09/08/2016      Objective     BP 118/80   Pulse 84   Temp 97.7 F (36.5 C)   Ht 5' 4"  (1.626 m)   Wt 187 lb (84.8 kg)   LMP 06/19/2012   SpO2 97%   BMI 32.10 kg/m  BP Readings from Last 3 Encounters:  02/07/22 118/80  12/29/21 132/81  12/23/21 (!) 152/97   Wt Readings from Last 3 Encounters:  02/07/22 187 lb (84.8 kg)  12/27/21 190 lb (86.2 kg)  12/23/21 195 lb 12.8 oz (88.8 kg)      Physical Exam   General Appearance:     Alert, cooperative, in no acute distress, appears stated age   Head:    Normocephalic, without obvious abnormality, atraumatic  Eyes:    PERRL, conjunctiva/corneas clear, EOM's intact, fundi    benign, both eyes  Ears:    Normal TM's and external ear canals, both ears  Nose:   Nares normal, septum midline, mucosa normal, no drainage    or sinus tenderness  Throat:   Lips, mucosa, and tongue normal; teeth and gums normal  Neck:   Supple, symmetrical, trachea midline, no adenopathy;    thyroid:  no enlargement/tenderness/nodules; no JVD  Back:     Symmetric, no curvature, ROM normal, no CVA tenderness  Lungs:     Clear to auscultation  bilaterally, respirations unlabored  Chest Wall:    No tenderness or deformity   Heart:    Normal heart rate. Normal rhythm. No murmurs, rubs, or gallops.   Breast Exam:    deferred  Abdomen:     Soft, non-tender, bowel  sounds active all four quadrants,    no masses, no organomegaly  Pelvic:    deferred  Extremities:   All extremities are intact. No cyanosis or edema  Pulses:   2+ and symmetric all extremities  Skin:   Skin color, texture, turgor normal, no rashes or lesions  Lymph nodes:   Cervical and supraclavicular nodes normal  Neurologic:   CNII-XII grossly intact.     Last depression screening scores    02/07/2022    1:27 PM 08/05/2021   12:04 PM 06/14/2021    4:43 PM  PHQ 2/9 Scores  PHQ - 2 Score 0 0 0  PHQ- 9 Score 0  1   Last fall risk screening    02/07/2022    1:26 PM  [place] in the past year? 0  Number falls in past yr: 0  Injury with Fall? 0  Risk for fall due to : No Fall Risks  Follow up Falls evaluation completed     No results found for any visits on 02/07/22.  Assessment & Plan    Routine Health Maintenance and Physical Exam  Exercise Activities and Dietary recommendations -A heart healthy diet low in fat and carbohydrates. Recommend moderate exercise 150 mins/wk.  Immunization History  Administered Date(s) Administered   DTaP 04/19/2016   Influenza,inj,Quad PF,6+ Mos 04/01/2019, 05/31/2020, 06/14/2021   Influenza-Unspecified 03/14/2013, 03/28/2018   Zoster Recombinat (Shingrix) 10/27/2019, 12/14/2020    Health Maintenance  Topic Date Due   HIV Screening  Never done   INFLUENZA VACCINE  01/17/2022   PAP SMEAR-Modifier  06/01/2023   MAMMOGRAM  12/24/2023   TETANUS/TDAP  04/19/2026   COLONOSCOPY (Pts 45-74yr Insurance coverage will need to be confirmed)  10/11/2031   Hepatitis C Screening  Completed   Zoster Vaccines- Shingrix  Completed   HPV VACCINES  Aged Out   COVID-19 Vaccine  Discontinued    Discussed health benefits  of physical activity, and encouraged her to engage in regular exercise appropriate for her age and condition.  Problem List Items Addressed This Visit       Other   Healthcare maintenance - Primary   Discussed with patient most recent lab results which are essentially within normal limits or stable from prior. Discussed with patient lipid panel and findings of CAD on chest CT from earlier this year. Pt reports will continue to work on diet and lifestyle changes, deferred medication therapy at this time. The 10-year ASCVD risk score (Arnett DK, et al., 2019) is: 5%   Values used to calculate the score:     Age: [age]years     Sex: Female     Is Non-Hispanic African American: No     Diabetic: No     Tobacco smoker: No     Systolic Blood Pressure: 1456mmHg     Is BP treated: Yes     HDL Cholesterol: 56 mg/dL     Total Cholesterol: 194 mg/dL BP stable. Continue current medication regimen. UTD mammogram, pap, Tdap and colonoscopy.    Return in about 6 months (around 08/10/2022) for HTN, HLD and FBW (lipid panel, cmp).       MLorrene Reid PA-C  [phone](phone) [phone](fax)  CAmity

## 2022-02-08 ENCOUNTER — Other Ambulatory Visit: Payer: Self-pay | Admitting: Thoracic Surgery (Cardiothoracic Vascular Surgery)

## 2022-02-08 DIAGNOSIS — Z8719 Personal history of other diseases of the digestive system: Secondary | ICD-10-CM

## 2022-02-09 NOTE — Progress Notes (Signed)
      [address]       [place] [address]             [phone]        Marissa Duarte [facility] Medical Record #[id number] Date of Birth: 1961-02-27  Referring: Sherrilyn Rist, MD Primary Care: Mayer Masker, PA-C Primary Cardiologist:None  Reason for visit:   follow-up  History of Present Illness:     61yo female presents for her 1 month appointment following PEH repair.  Overall she is doing well.  She denies any reflux or dysphagia.  She has not required any iron supplements.  Physical Exam: Vitals:   02/10/22 1117  BP: (!) 149/94  Pulse: 83  Resp: 18  SpO2: 95%     Alert NAD Incision clean, well healed Abdomen, ND no peripheral edema   Diagnostic Studies & Laboratory data: CXR: Clear     Assessment / Plan:   61yo female s/p PEH repair.  Overall doing well.  She is off all weight restrictions.  She will follow-up in 3 months virtually.   Marissa Duarte 02/09/2022 2:48 PM

## 2022-02-10 ENCOUNTER — Ambulatory Visit
Admission: RE | Admit: 2022-02-10 | Discharge: 2022-02-10 | Disposition: A | Discharge status: Home / Self Care | Payer: 59 | Source: Ambulatory Visit | Attending: Thoracic Surgery (Cardiothoracic Vascular Surgery) | Admitting: Thoracic Surgery (Cardiothoracic Vascular Surgery)

## 2022-02-10 ENCOUNTER — Ambulatory Visit (INDEPENDENT_AMBULATORY_CARE_PROVIDER_SITE_OTHER): Payer: Self-pay | Admitting: Thoracic Surgery (Cardiothoracic Vascular Surgery)

## 2022-02-10 VITALS — BP 149/94 | HR 83 | Resp 18 | Ht 64.0 in | Wt 188.0 lb

## 2022-02-10 DIAGNOSIS — K449 Diaphragmatic hernia without obstruction or gangrene: Secondary | ICD-10-CM

## 2022-02-10 DIAGNOSIS — Z8719 Personal history of other diseases of the digestive system: Secondary | ICD-10-CM

## 2022-02-10 DIAGNOSIS — Z9889 Other specified postprocedural states: Secondary | ICD-10-CM

## 2022-05-05 ENCOUNTER — Ambulatory Visit (INDEPENDENT_AMBULATORY_CARE_PROVIDER_SITE_OTHER): Payer: 59 | Admitting: Thoracic Surgery (Cardiothoracic Vascular Surgery)

## 2022-05-05 DIAGNOSIS — Z09 Encounter for follow-up examination after completed treatment for conditions other than malignant neoplasm: Secondary | ICD-10-CM

## 2022-05-05 DIAGNOSIS — K449 Diaphragmatic hernia without obstruction or gangrene: Secondary | ICD-10-CM

## 2022-05-05 DIAGNOSIS — Z8719 Personal history of other diseases of the digestive system: Secondary | ICD-10-CM

## 2022-05-05 NOTE — Progress Notes (Signed)
     [address]       [place] [address]             [phone]       Patient: Home Provider: Office Consent for Telemedicine visit obtained.  Today's visit was completed via a real-time telehealth (see specific modality noted below). The patient/authorized person provided oral consent at the time of the visit to engage in a telemedicine encounter with the present provider at [facility]. The patient/authorized person was informed of the potential benefits, limitations, and risks of telemedicine. The patient/authorized person expressed understanding that the laws that protect confidentiality also apply to telemedicine. The patient/authorized person acknowledged understanding that telemedicine does not provide emergency services and that he or she would need to call 911 or proceed to the nearest hospital for help if such a need arose.   Total time spent in the clinical discussion 10 minutes.  Telehealth Modality: Phone visit (audio only)  I had a telephone visit with Mrs. Polanco.  Overall doing well.  She denies any dysphagia or reflux.  She will follow-up as needed.

## 2022-08-07 ENCOUNTER — Ambulatory Visit (HOSPITAL_BASED_OUTPATIENT_CLINIC_OR_DEPARTMENT_OTHER): Payer: 59 | Admitting: Obstetrics & Gynecology

## 2022-10-30 ENCOUNTER — Ambulatory Visit (HOSPITAL_BASED_OUTPATIENT_CLINIC_OR_DEPARTMENT_OTHER): Payer: Self-pay | Admitting: Obstetrics & Gynecology

## 2022-12-26 ENCOUNTER — Ambulatory Visit (HOSPITAL_BASED_OUTPATIENT_CLINIC_OR_DEPARTMENT_OTHER): Payer: Self-pay | Admitting: Obstetrics & Gynecology

## 2023-01-09 ENCOUNTER — Ambulatory Visit (INDEPENDENT_AMBULATORY_CARE_PROVIDER_SITE_OTHER): Payer: 59 | Admitting: Obstetrics & Gynecology

## 2023-01-09 ENCOUNTER — Encounter (HOSPITAL_BASED_OUTPATIENT_CLINIC_OR_DEPARTMENT_OTHER): Payer: Self-pay | Admitting: Obstetrics & Gynecology

## 2023-01-09 ENCOUNTER — Other Ambulatory Visit (HOSPITAL_COMMUNITY)
Admission: RE | Admit: 2023-01-09 | Discharge: 2023-01-09 | Disposition: A | Discharge status: Home / Self Care | Payer: 59 | Source: Ambulatory Visit | Attending: Obstetrics & Gynecology | Admitting: Obstetrics & Gynecology

## 2023-01-09 VITALS — BP 148/88 | HR 78 | Ht 64.0 in | Wt 190.2 lb

## 2023-01-09 DIAGNOSIS — I10 Essential (primary) hypertension: Secondary | ICD-10-CM | POA: Diagnosis not present

## 2023-01-09 DIAGNOSIS — Z8719 Personal history of other diseases of the digestive system: Secondary | ICD-10-CM

## 2023-01-09 DIAGNOSIS — Z9889 Other specified postprocedural states: Secondary | ICD-10-CM | POA: Diagnosis not present

## 2023-01-09 DIAGNOSIS — Z124 Encounter for screening for malignant neoplasm of cervix: Secondary | ICD-10-CM | POA: Diagnosis present

## 2023-01-09 DIAGNOSIS — Z01419 Encounter for gynecological examination (general) (routine) without abnormal findings: Secondary | ICD-10-CM | POA: Diagnosis not present

## 2023-01-09 DIAGNOSIS — Z1331 Encounter for screening for depression: Secondary | ICD-10-CM

## 2023-01-09 NOTE — Progress Notes (Signed)
62 y.o. G2P2 Married White or Caucasian female here for annual exam.  Has laparoscopic paraesophageal hernia repair with Dr. Cliffton Asters.  She had a incidental pneumothorax and did have to have a chest tube and this resolved quickly.  She's done really well since surgery.  Hb was significantly improved.  Has bene able to give blood recently.    Denies vaginal bleeding.    Patient's last menstrual period was 06/19/2012.          Sexually active: yes The current method of family planning is post menopausal status.    Exercising: Yes.     Walking 3-4 times weekly Smoker:  no  Health Maintenance: Pap:  2021 History of abnormal Pap:  no MMG:  scheduled today Colonoscopy:  10/10/2021 follow up 10 years BMD:   discussed today.  She will see if can do with her mammogram. Screening Labs: done 01/2022 and will have blood work done then   reports that she quit smoking about 26 years ago. Her smoking use included cigarettes. She started smoking about 46 years ago. She has a 10 pack-year smoking history. She has never used smokeless tobacco. She reports current alcohol use of about 6.0 standard drinks of alcohol per week. She reports that she does not use drugs.  Past Medical History:  Diagnosis Date   Acid reflux    Esophagitis 08/2009   noted on EGD   History of hiatal hernia    Hypertension    Kidney stone    Shingles 2021    Past Surgical History:  Procedure Laterality Date   BREAST LUMPECTOMY Left age 41   fibroadenoma   COLONOSCOPY     ESOPHAGOGASTRODUODENOSCOPY N/A 12/27/2021   Procedure: ESOPHAGOGASTRODUODENOSCOPY (EGD);  Surgeon: Corliss Skains, MD;  Location: [facility] OR;  Service: Thoracic;  Laterality: N/A;   TUBAL LIGATION  1990   UPPER GASTROINTESTINAL ENDOSCOPY     WISDOM TOOTH EXTRACTION     XI ROBOTIC ASSISTED PARAESOPHAGEAL HERNIA REPAIR N/A 12/27/2021   Procedure: XI ROBOTIC ASSISTED PARAESOPHAGEAL HERNIA REPAIR WITH MESH AND FUNDOPLICATION;  Surgeon: Corliss Skains,  MD;  Location: [facility] OR;  Service: Thoracic;  Laterality: N/A;    Current Outpatient Medications  Medication Sig Dispense Refill   Omega-3 Fatty Acids (FISH OIL PO) Take 1,280 mg by mouth every other day.     Turmeric (QC TUMERIC COMPLEX PO) Take 1,500 mg by mouth every other day.     valsartan-hydrochlorothiazide (DIOVAN-HCT) 160-25 MG tablet TAKE 1/2 TABLET BY MOUTH EVERY DAY 45 tablet 1   No current facility-administered medications for this visit.    Family History  Problem Relation Age of Onset   Hypertension Mother    Thyroid disease Mother    Hypertension Father    Polycystic kidney disease Father    Cancer Father        lung   Infertility Sister    Hypertension Sister    Polycystic kidney disease Sister    Healthy Brother    Diabetes Maternal Grandfather    Heart attack Maternal Uncle    Colon cancer Neg Hx    Esophageal cancer Neg Hx    Rectal cancer Neg Hx    Stomach cancer Neg Hx    Colon polyps Neg Hx     ROS: Constitutional: negative Genitourinary:negative  Exam:   BP (!) 147/92 (BP Location: Right Arm, Patient Position: Sitting, Cuff Size: Large)   Pulse 78   Ht 5\' 4"  (1.626 m) Comment: Reported  Wt 190 lb  3.2 oz (86.3 kg)   LMP 06/19/2012   BMI 32.65 kg/m   Height: 5\' 4"  (162.6 cm) (Reported)  General appearance: alert, cooperative and appears stated age Head: Normocephalic, without obvious abnormality, atraumatic Neck: no adenopathy, supple, symmetrical, trachea midline and thyroid normal to inspection and palpation Lungs: clear to auscultation bilaterally Breasts: normal appearance, no masses or tenderness Heart: regular rate and rhythm Abdomen: soft, non-tender; bowel sounds normal; no masses,  no organomegaly Extremities: extremities normal, atraumatic, no cyanosis or edema Skin: Skin color, texture, turgor normal. No rashes or lesions Lymph nodes: Cervical, supraclavicular, and axillary nodes normal. No abnormal inguinal nodes  palpated Neurologic: Grossly normal   Pelvic: External genitalia:  no lesions              Urethra:  normal appearing urethra with no masses, tenderness or lesions              Bartholins and Skenes: normal                 Vagina: normal appearing vagina with normal color and no discharge, no lesions              Cervix: no lesions              Pap taken: Yes.   Bimanual Exam:  Uterus:  normal size, contour, position, consistency, mobility, non-tender              Adnexa: normal adnexa and no mass, fullness, tenderness               Rectovaginal: Confirms               Anus:  normal sphincter tone, no lesions  Chaperone, Ina Homes, CMA, was present for exam.  Assessment/Plan: 1. Well woman exam with routine gynecological exam - Pap smear and HR HPV obtained - Mammogram scheduled today - Colonoscopy 10/10/2021, follow up 10 years - Bone mineral density discussed.  She will call to see if can do with mammogram - lab work done with PCP, who has moved, but she has appt scheduled with new PCP - vaccines reviewed/updated  2. Cervical cancer screening - Cytology - PAP( [place])  3. S/P repair of paraesophageal hernia - doing well  4. Essential hypertension, benign - on diovan

## 2023-01-11 ENCOUNTER — Encounter (HOSPITAL_BASED_OUTPATIENT_CLINIC_OR_DEPARTMENT_OTHER): Payer: Self-pay | Admitting: *Deleted

## 2023-01-11 LAB — CYTOLOGY - PAP
Comment: NEGATIVE
Diagnosis: NEGATIVE
Diagnosis: REACTIVE
High risk HPV: NEGATIVE

## 2023-01-18 ENCOUNTER — Telehealth: Payer: Self-pay

## 2023-01-18 DIAGNOSIS — I10 Essential (primary) hypertension: Secondary | ICD-10-CM

## 2023-01-18 MED ORDER — VALSARTAN-HYDROCHLOROTHIAZIDE 160-25 MG PO TABS: ORAL_TABLET | ORAL | 0 refills | Status: DC

## 2023-01-18 NOTE — Telephone Encounter (Signed)
Prescription Request  01/18/2023  LOV: 02/07/22  What is the name of the medication or equipment?   valsartan-hydrochlorothiazide (DIOVAN-HCT) 160-25 MG tablet     Have you contacted your pharmacy to request a refill? Yes   Which pharmacy would you like this sent to?  [id number] [place], [place] - [address] [place] [address] Phone: [phone] Fax: [phone]   Patient notified that their request is being sent to the clinical staff for review and that they should receive a response within 2 business days.   Please advise at Mobile [phone] (mobile)

## 2023-01-18 NOTE — Telephone Encounter (Signed)
30 day sent 

## 2023-01-25 ENCOUNTER — Other Ambulatory Visit: Payer: Self-pay | Admitting: Family Medicine

## 2023-01-25 DIAGNOSIS — D649 Anemia, unspecified: Secondary | ICD-10-CM

## 2023-01-25 DIAGNOSIS — R5383 Other fatigue: Secondary | ICD-10-CM

## 2023-01-25 DIAGNOSIS — E7800 Pure hypercholesterolemia, unspecified: Secondary | ICD-10-CM

## 2023-01-25 DIAGNOSIS — I10 Essential (primary) hypertension: Secondary | ICD-10-CM

## 2023-01-25 DIAGNOSIS — Z0000 Encounter for general adult medical examination without abnormal findings: Secondary | ICD-10-CM

## 2023-02-07 ENCOUNTER — Other Ambulatory Visit: Payer: 59

## 2023-02-07 DIAGNOSIS — I10 Essential (primary) hypertension: Secondary | ICD-10-CM

## 2023-02-07 DIAGNOSIS — D649 Anemia, unspecified: Secondary | ICD-10-CM

## 2023-02-07 DIAGNOSIS — R5383 Other fatigue: Secondary | ICD-10-CM

## 2023-02-07 DIAGNOSIS — Z0000 Encounter for general adult medical examination without abnormal findings: Secondary | ICD-10-CM

## 2023-02-07 DIAGNOSIS — E7800 Pure hypercholesterolemia, unspecified: Secondary | ICD-10-CM

## 2023-02-08 LAB — COMPREHENSIVE METABOLIC PANEL
ALT: 17 IU/L (ref 0–32)
AST: 19 IU/L (ref 0–40)
Albumin: 4 g/dL (ref 3.9–4.9)
Alkaline Phosphatase: 71 IU/L (ref 44–121)
BUN/Creatinine Ratio: 15 (ref 12–28)
BUN: 10 mg/dL (ref 8–27)
Bilirubin Total: 0.6 mg/dL (ref 0.0–1.2)
CO2: 25 mmol/L (ref 20–29)
Calcium: 9.1 mg/dL (ref 8.7–10.3)
Chloride: 103 mmol/L (ref 96–106)
Creatinine, Ser: 0.66 mg/dL (ref 0.57–1.00)
Globulin, Total: 2 g/dL (ref 1.5–4.5)
Glucose: 88 mg/dL (ref 70–99)
Potassium: 4.2 mmol/L (ref 3.5–5.2)
Sodium: 142 mmol/L (ref 134–144)
Total Protein: 6 g/dL (ref 6.0–8.5)
eGFR: 98 mL/min/{1.73_m2} (ref >59)

## 2023-02-08 LAB — CBC
Hematocrit: 41.5 % (ref 34.0–46.6)
Hemoglobin: 13.6 g/dL (ref 11.1–15.9)
MCH: 30.6 pg (ref 26.6–33.0)
MCHC: 32.8 g/dL (ref 31.5–35.7)
MCV: 93 fL (ref 79–97)
Platelets: 215 10*3/uL (ref 150–450)
RBC: 4.45 x10E6/uL (ref 3.77–5.28)
RDW: 12.2 % (ref 11.7–15.4)
WBC: 4.2 10*3/uL (ref 3.4–10.8)

## 2023-02-08 LAB — LIPID PANEL
Chol/HDL Ratio: 3.8 ratio (ref 0.0–4.4)
Cholesterol, Total: 216 mg/dL — ABNORMAL HIGH (ref 100–199)
HDL: 57 mg/dL (ref >39)
LDL Chol Calc (NIH): 138 mg/dL — ABNORMAL HIGH (ref 0–99)
Triglycerides: 118 mg/dL (ref 0–149)
VLDL Cholesterol Cal: 21 mg/dL (ref 5–40)

## 2023-02-08 LAB — TSH: TSH: 1.98 u[IU]/mL (ref 0.450–4.500)

## 2023-02-08 LAB — HEMOGLOBIN A1C
Est. average glucose Bld gHb Est-mCnc: 105 mg/dL
Hgb A1c MFr Bld: 5.3 % (ref 4.8–5.6)

## 2023-02-12 ENCOUNTER — Encounter: Payer: Self-pay | Admitting: Family Medicine

## 2023-02-12 ENCOUNTER — Ambulatory Visit (INDEPENDENT_AMBULATORY_CARE_PROVIDER_SITE_OTHER): Payer: 59 | Admitting: Family Medicine

## 2023-02-12 ENCOUNTER — Other Ambulatory Visit: Payer: Self-pay | Admitting: Family Medicine

## 2023-02-12 VITALS — BP 127/88 | HR 90 | Resp 18 | Ht 64.0 in | Wt 192.0 lb

## 2023-02-12 DIAGNOSIS — E7800 Pure hypercholesterolemia, unspecified: Secondary | ICD-10-CM

## 2023-02-12 DIAGNOSIS — Z0000 Encounter for general adult medical examination without abnormal findings: Secondary | ICD-10-CM

## 2023-02-12 DIAGNOSIS — I10 Essential (primary) hypertension: Secondary | ICD-10-CM

## 2023-02-12 MED ORDER — VALSARTAN-HYDROCHLOROTHIAZIDE 160-25 MG PO TABS
ORAL_TABLET | ORAL | 1 refills | Status: DC
Start: 2023-02-12 — End: 2023-11-13

## 2023-02-12 NOTE — Progress Notes (Signed)
Complete physical exam  Patient: Marissa Duarte   DOB: 1961/02/05   62 y.o. Female  MRN: [id number]  Subjective:    Chief Complaint  Patient presents with   Annual Exam    Marissa Duarte is a 62 y.o. female who presents today for a complete physical exam. She reports consuming a low fat and low sodium diet.  She injured her knee several months ago and has not been walking as much as usual since then.  She hopes to get back to her usual routine as her knee continues to heal.  She generally feels well. She reports sleeping well. She does not have additional problems to discuss today.    Most recent fall risk assessment:    02/12/2023    1:47 PM  Fall Risk   Falls in the past year? 0  Number falls in past yr: 0  Injury with Fall? 0  Risk for fall due to : No Fall Risks  Follow up Falls evaluation completed     Most recent depression and anxiety screenings:    02/12/2023    1:47 PM 01/09/2023   10:59 AM  PHQ 2/9 Scores  PHQ - 2 Score 0 0  PHQ- 9 Score 1       02/07/2022    1:27 PM 06/14/2021    4:43 PM 12/14/2020    2:28 PM  GAD 7 : Generalized Anxiety Score  Nervous, Anxious, on Edge 0 0 0  Control/stop worrying 0 1 0  Worry too much - different things 0 0 0  Trouble relaxing 0 0 0  Restless 0 0 0  Easily annoyed or irritable 0 0 0  Afraid - awful might happen 0 0 0  Total GAD 7 Score 0 1 0  Anxiety Difficulty Not difficult at all      Patient Active Problem List   Diagnosis Date Noted   S/P repair of paraesophageal hernia 12/27/2021   Former smoker 05/31/2020   Hyperlipidemia 04/01/2019   Obesity (BMI 35.0-39.9 without comorbidity) 08/23/2016   Essential hypertension, benign 03/05/2014    Past Surgical History:  Procedure Laterality Date   BREAST LUMPECTOMY Left age 42   fibroadenoma   COLONOSCOPY     ESOPHAGOGASTRODUODENOSCOPY N/A 12/27/2021   Procedure: ESOPHAGOGASTRODUODENOSCOPY (EGD);  Surgeon: Corliss Skains, MD;  Location: [facility] OR;  Service:  Thoracic;  Laterality: N/A;   TUBAL LIGATION  1990   UPPER GASTROINTESTINAL ENDOSCOPY     WISDOM TOOTH EXTRACTION     XI ROBOTIC ASSISTED PARAESOPHAGEAL HERNIA REPAIR N/A 12/27/2021   Procedure: XI ROBOTIC ASSISTED PARAESOPHAGEAL HERNIA REPAIR WITH MESH AND FUNDOPLICATION;  Surgeon: Corliss Skains, MD;  Location: [facility] OR;  Service: Thoracic;  Laterality: N/A;   Social History   Tobacco Use   Smoking status: Former    Current packs/day: 0.00    Average packs/day: 0.5 packs/day for 20.0 years (10.0 ttl pk-yrs)    Types: Cigarettes    Start date: 06/19/1976    Quit date: 06/19/1996    Years since quitting: 26.6    Passive exposure: Past   Smokeless tobacco: Never  Vaping Use   Vaping status: Never Used  Substance Use Topics   Alcohol use: Yes    Alcohol/week: 6.0 standard drinks of alcohol    Types: 6 Glasses of wine per week    Comment: occasionally   Drug use: No   Family History  Problem Relation Age of Onset   Hypertension Mother    Thyroid  disease Mother    Hypertension Father    Polycystic kidney disease Father    Cancer Father        lung   Infertility Sister    Hypertension Sister    Polycystic kidney disease Sister    Healthy Brother    Diabetes Maternal Grandfather    Heart attack Maternal Uncle    Colon cancer Neg Hx    Esophageal cancer Neg Hx    Rectal cancer Neg Hx    Stomach cancer Neg Hx    Colon polyps Neg Hx    No Known Allergies   Patient Care Team: Melida Quitter, PA as PCP - General (Family Medicine) Mammography, [facility] (Diagnostic Radiology)   Outpatient Medications Prior to Visit  Medication Sig   Omega-3 Fatty Acids (FISH OIL PO) Take 1,280 mg by mouth every other day.   Turmeric (QC TUMERIC COMPLEX PO) Take 1,500 mg by mouth every other day.   [DISCONTINUED] valsartan-hydrochlorothiazide (DIOVAN-HCT) 160-25 MG tablet TAKE 1/2 TABLET BY MOUTH EVERY DAY   No facility-administered medications prior to visit.    Review of Systems   Constitutional:  Negative for chills, fever and malaise/fatigue.  HENT:  Negative for congestion and hearing loss.   Eyes:  Negative for blurred vision and double vision.  Respiratory:  Negative for cough and shortness of breath.   Cardiovascular:  Negative for chest pain, palpitations and leg swelling.  Gastrointestinal:  Negative for abdominal pain, constipation, diarrhea and heartburn.  Genitourinary:  Negative for frequency and urgency.  Musculoskeletal:  Negative for myalgias and neck pain.  Neurological:  Negative for headaches.  Endo/Heme/Allergies:  Negative for polydipsia.  Psychiatric/Behavioral:  Negative for depression. The patient is not nervous/anxious and does not have insomnia.       Objective:    BP 127/88 (BP Location: Left Arm, Patient Position: Sitting, Cuff Size: Normal)   Pulse 90   Resp 18   Ht 5\' 4"  (1.626 m)   Wt 192 lb (87.1 kg)   LMP 06/19/2012   SpO2 96%   BMI 32.96 kg/m    Physical Exam Constitutional:      General: She is not in acute distress.    Appearance: Normal appearance.  HENT:     Head: Normocephalic and atraumatic.     Right Ear: Tympanic membrane, ear canal and external ear normal.     Left Ear: Tympanic membrane, ear canal and external ear normal.     Nose: Nose normal.     Mouth/Throat:     Mouth: Mucous membranes are moist.     Pharynx: No oropharyngeal exudate or posterior oropharyngeal erythema.  Eyes:     Extraocular Movements: Extraocular movements intact.     Conjunctiva/sclera: Conjunctivae normal.     Pupils: Pupils are equal, round, and reactive to light.     Comments: Wears contacts and glasses, prescription up-to-date  Neck:     Thyroid: No thyroid mass, thyromegaly or thyroid tenderness.  Cardiovascular:     Rate and Rhythm: Normal rate and regular rhythm.     Heart sounds: Normal heart sounds. No murmur heard.    No friction rub. No gallop.  Pulmonary:     Effort: Pulmonary effort is normal. No respiratory  distress.     Breath sounds: Normal breath sounds. No wheezing, rhonchi or rales.  Abdominal:     General: Abdomen is flat. Bowel sounds are normal. There is no distension.     Palpations: There is no mass.  Tenderness: There is no abdominal tenderness. There is no guarding.  Musculoskeletal:        General: Normal range of motion.     Cervical back: Normal range of motion and neck supple.  Lymphadenopathy:     Cervical: No cervical adenopathy.  Skin:    General: Skin is warm and dry.  Neurological:     Mental Status: She is alert and oriented to person, place, and time.     Cranial Nerves: No cranial nerve deficit.     Motor: No weakness.     Deep Tendon Reflexes: Reflexes normal.  Psychiatric:        Mood and Affect: Mood normal.       Assessment & Plan:    Routine Health Maintenance and Physical Exam  Immunization History  Administered Date(s) Administered   DTaP 04/19/2016   Influenza,inj,Quad PF,6+ Mos 04/01/2019, 05/31/2020, 06/14/2021   Influenza-Unspecified 03/14/2013, 03/28/2018   Zoster Recombinant(Shingrix) 10/27/2019, 12/14/2020    Health Maintenance  Topic Date Due   HIV Screening  Never done   INFLUENZA VACCINE  09/17/2023 (Originally 01/18/2023)   MAMMOGRAM  01/08/2025   PAP SMEAR-Modifier  01/08/2026   DTaP/Tdap/Td (2 - Tdap) 04/19/2026   Colonoscopy  10/11/2031   Hepatitis C Screening  Completed   Zoster Vaccines- Shingrix  Completed   HPV VACCINES  Aged Out   COVID-19 Vaccine  Discontinued    Reviewed most recent labs including CBC, CMP, lipid panel, A1C, TSH, and vitamin D. All within normal limits/stable from last check other than LDL which increased to 138 from 115.  Discussed health benefits of physical activity, and encouraged her to engage in regular exercise appropriate for her age and condition.  Wellness examination  Essential hypertension, benign Assessment & Plan: BP goal <140/90.  Stable, at goal.  Continue  valsartan-hydrochlorothiazide 160-25 mg daily.  Most recent CMP within normal limits.  Will continue to monitor.  Orders: -     Valsartan-hydroCHLOROthiazide; TAKE 1/2 TABLET BY MOUTH DAILY  Dispense: 45 tablet; Refill: 1  Pure hypercholesterolemia Assessment & Plan: Last lipid panel: LDL 138, HDL 57, triglycerides 118. The 10-year ASCVD risk score (Arnett DK, et al., 2019) is: 6.8%.  Denies family history of early CAD.  Continue with heart healthy diet and routine physical activity.  Will continue to monitor.     Return in about 6 months (around 08/15/2023) for follow-up for HTN and HLD, fasting blood work 1 week before.     Melida Quitter, PA

## 2023-02-12 NOTE — Patient Instructions (Signed)
For most women after menopause, I recommend considering vitamin D and calcium supplements if you are not getting enough in your diet.  vitamin D: recommend 800 international units daily total Calcium: total of 1200 mg of calcium daily.  If you eat a very calcium rich diet you may be able to obtain that without a supplement.  If not, then I recommend calcium 500 mg twice a day.  There are several products over-the-counter such as Caltrate D and Viactiv chews which are great options that contain calcium and vitamin D.  Otherwise, keep up the good work!

## 2023-02-12 NOTE — Assessment & Plan Note (Addendum)
BP goal <140/90.  Stable, at goal.  Continue valsartan-hydrochlorothiazide 160-25 mg daily.  Most recent CMP within normal limits.  Will continue to monitor.

## 2023-02-12 NOTE — Assessment & Plan Note (Addendum)
Last lipid panel: LDL 138, HDL 57, triglycerides 118. The 10-year ASCVD risk score (Arnett DK, et al., 2019) is: 6.8%.  Denies family history of early CAD.  Continue with heart healthy diet and routine physical activity.  Will continue to monitor.

## 2023-07-26 ENCOUNTER — Encounter: Payer: Self-pay | Admitting: Family Medicine

## 2023-07-30 IMAGING — CT CT CHEST W/ CM
2 of 4 series · 15 of 36 positions shown, 18 images · IV contrast (ISOVUE 300)
Comparison: None Available.

CLINICAL DATA: Preoperative evaluation for hiatal hernia, iron
deficiency anemia

EXAM:
CT CHEST WITH CONTRAST
TECHNIQUE: Multidetector CT imaging of the chest was performed during
intravenous contrast administration.

[Series 2: axial st · axial · 0.78mm/px · z∈[-365,-101]mm · 12 of 157 slices shown, 15 images]
[im 13/157  mediastinal]
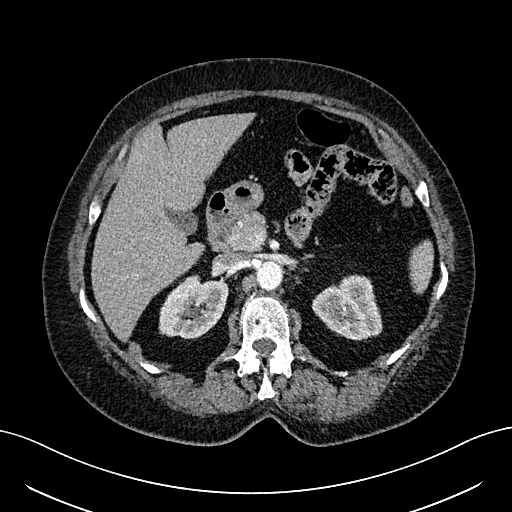
[im 13/157  lung]
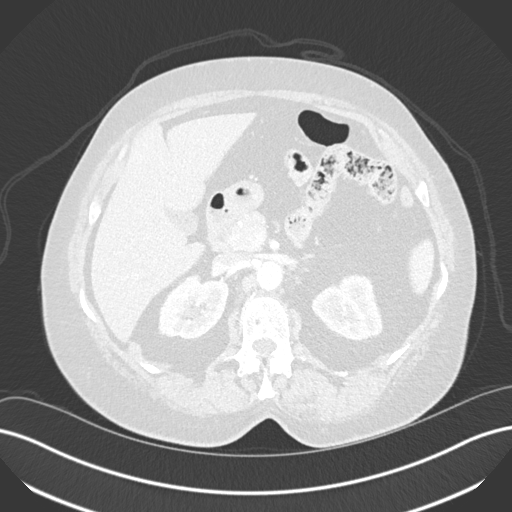
[im 25/157  lung]
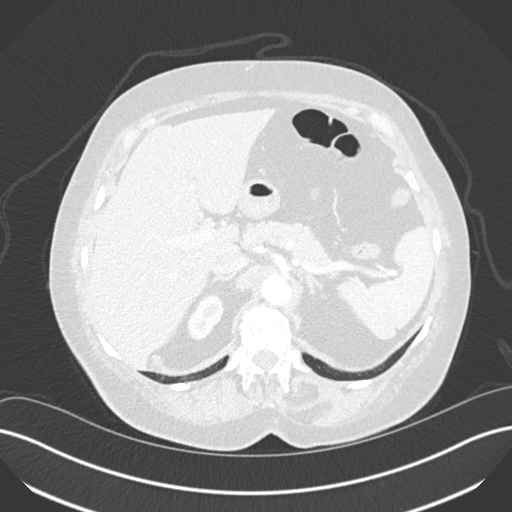
[im 37/157  lung]
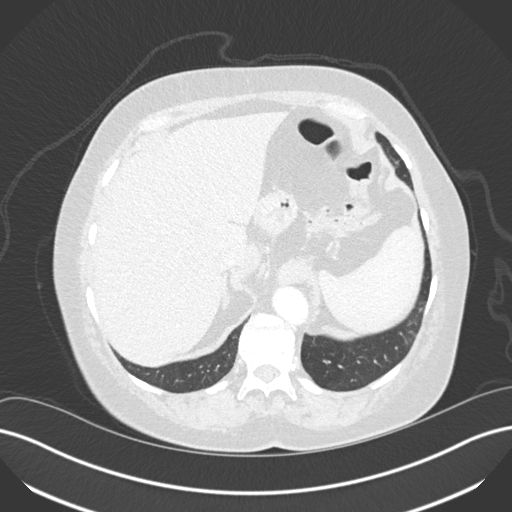
[im 49/157  lung]
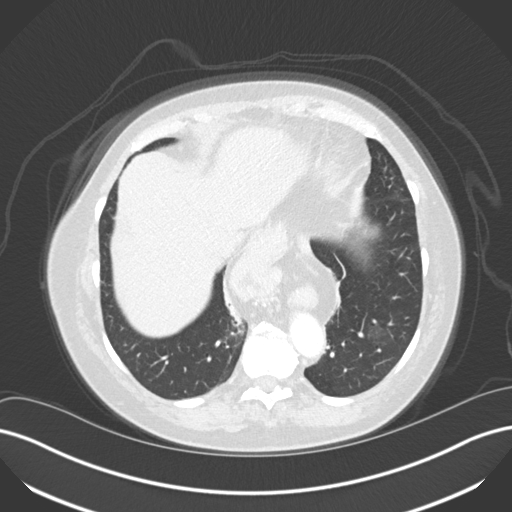
[im 61/157  mediastinal]
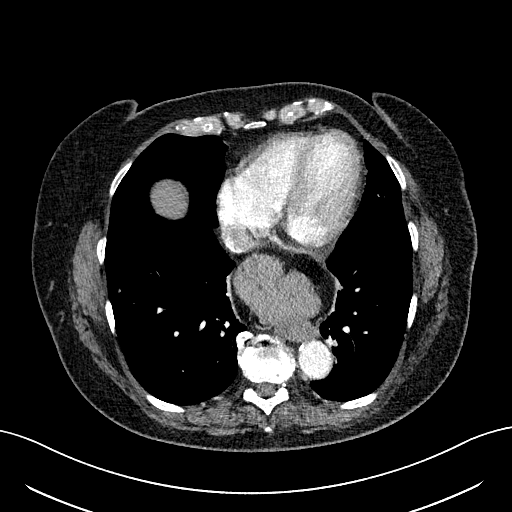
[im 61/157  lung]
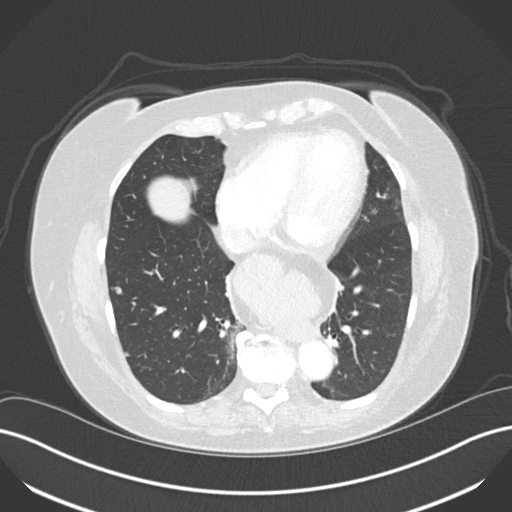
[im 73/157  lung]
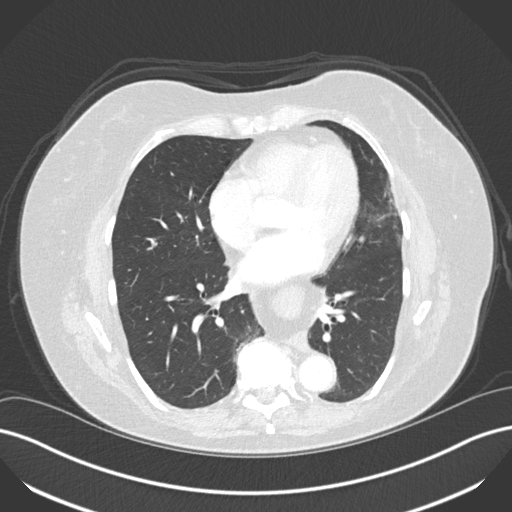
[im 85/157  lung]
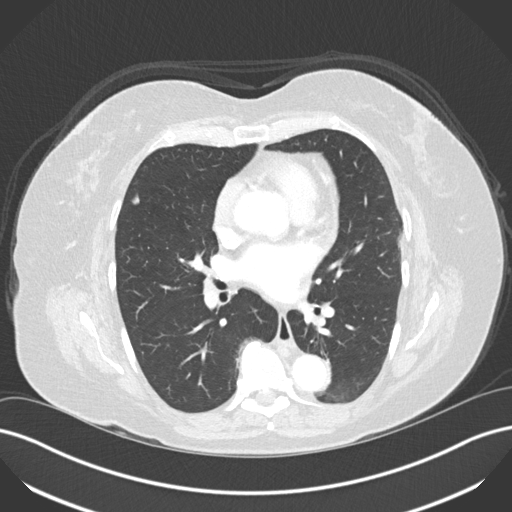
[im 97/157  lung]
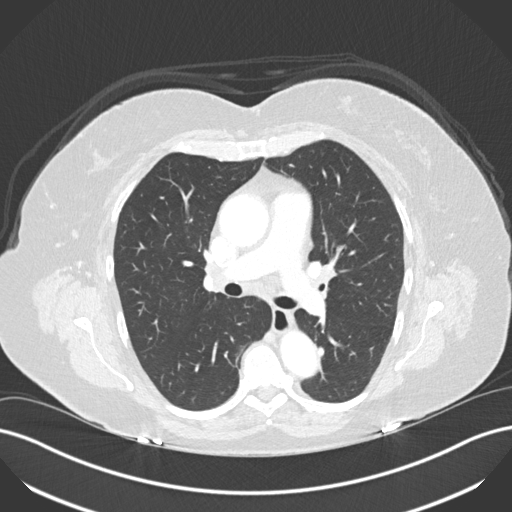
[im 109/157  mediastinal]
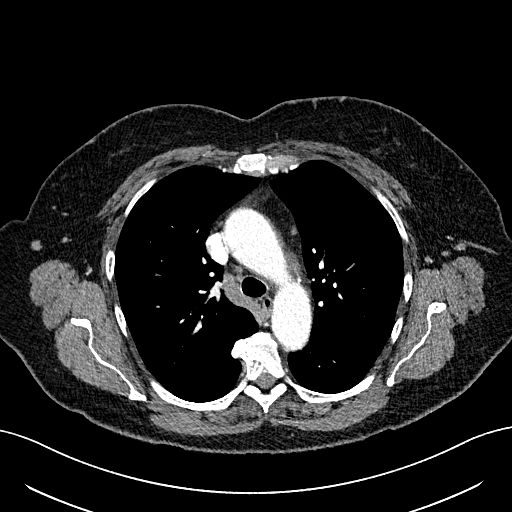
[im 109/157  lung]
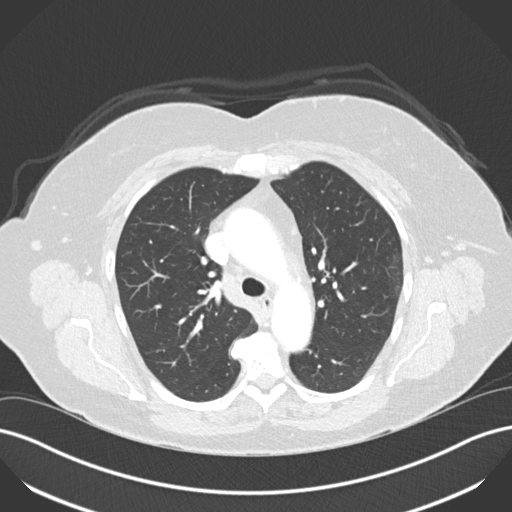
[im 121/157  lung]
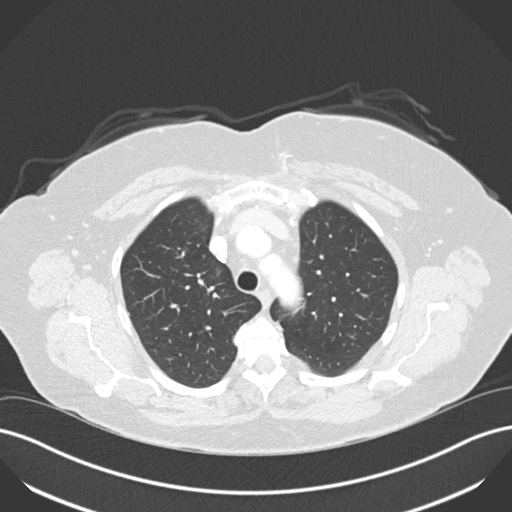
[im 133/157  lung]
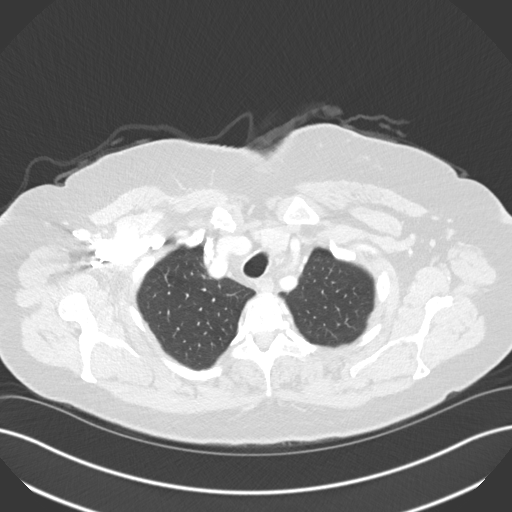
[im 145/157  lung]
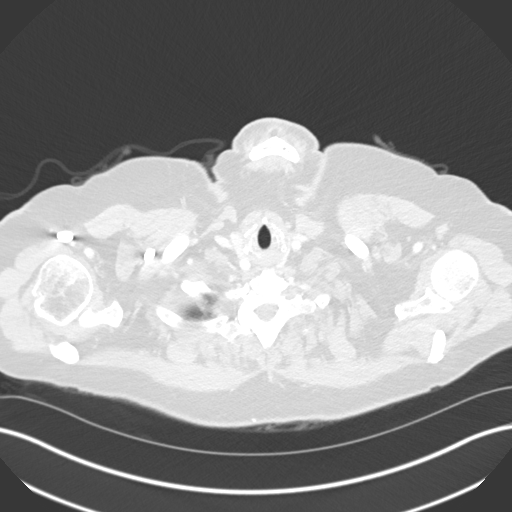

[Series 5: coronal · coronal · 0.62mm/px · 3 of 156 slices shown]
[im 32/156  lung]
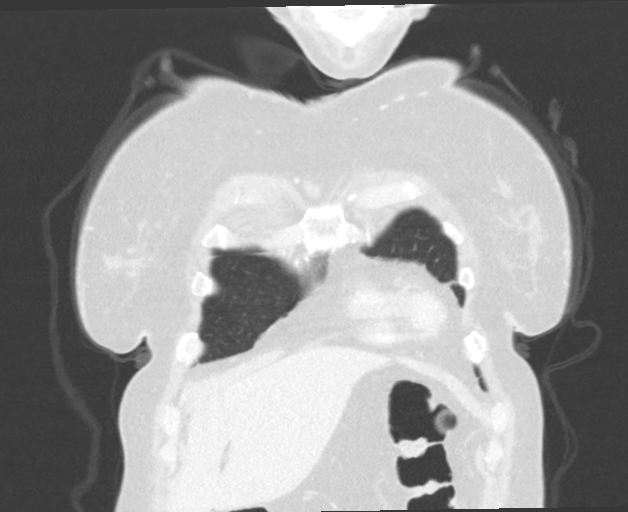
[im 63/156  lung]
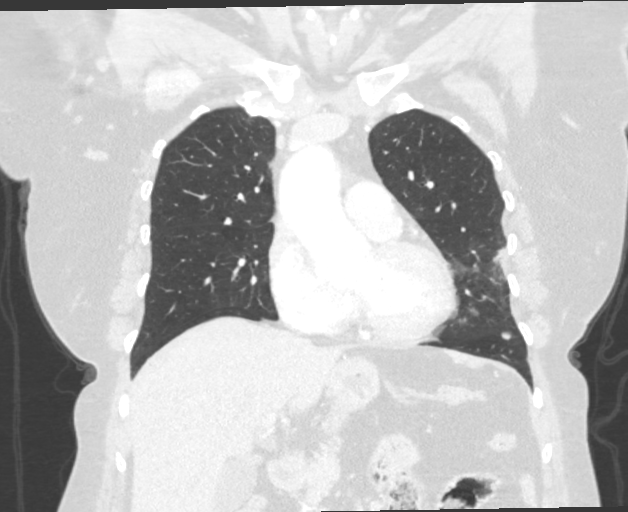
[im 94/156  lung]
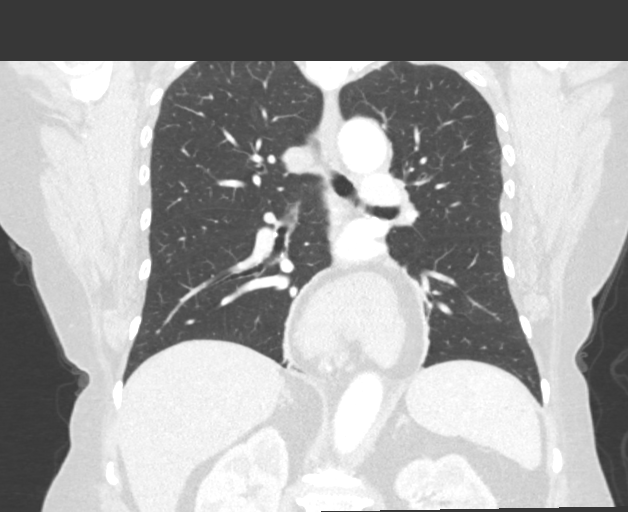

[15 of 36 positions shown; findings below may reference images not displayed]

RADIATION DOSE REDUCTION: This exam was performed according to the
departmental dose-optimization program which includes automated
exposure control, adjustment of the mA and/or kV according to
patient size and/or use of iterative reconstruction technique.

CONTRAST:  75mL OMNIPAQUE IOHEXOL 300 MG/ML  SOLN
FINDINGS: Cardiovascular: Scattered aortic atherosclerosis. Normal heart size.
Scattered left coronary artery calcifications. No pericardial
effusion.

Mediastinum/Nodes: No enlarged mediastinal, hilar, or axillary lymph
nodes. Large hiatal hernia with intrathoracic position of the
gastric body and fundus. Thyroid gland, trachea, and esophagus
demonstrate no significant findings.

Lungs/Pleura: Occasional small bilateral pulmonary nodules, for
example a 0.6 cm nodule of the anterior left lower lobe (series 7,
image 104), a 0.5 cm nodule of the dependent right lower lobe
(series 7, image 96), and a 0.3 cm nodule of the right costophrenic
recess (series 7, image 114). No pleural effusion or pneumothorax.

Upper Abdomen: No acute abnormality.

Musculoskeletal: No chest wall abnormality. No suspicious osseous
lesions identified.
IMPRESSION: 1. Large hiatal hernia with intrathoracic position of the gastric
body and fundus.
2. Occasional small bilateral pulmonary nodules, measuring 0.6 cm
and smaller. Non-contrast chest CT at 3-6 months is recommended. If
the nodules are stable at time of repeat CT, then future CT at 18-24
months (from today's scan) is considered optional for low-risk
patients, but is recommended for high-risk patients. This
recommendation follows the consensus statement: Guidelines for
Management of Incidental Pulmonary Nodules Detected on CT Images:
3. Coronary artery disease.

Aortic Atherosclerosis (ZQ1CB-TWA.A).

## 2023-08-13 ENCOUNTER — Telehealth: Payer: Self-pay

## 2023-08-13 NOTE — Telephone Encounter (Signed)
 Copied from CRM [id number]. Topic: Clinical - Prescription Issue >> Aug 13, 2023  9:56 AM Elle L wrote: Reason for CRM: The patient wants all future refills to be sent to [facility] on  [address] [place], [place] [address]  [phone]. I have updated her preferred pharmacy.

## 2023-08-30 DIAGNOSIS — Z85828 Personal history of other malignant neoplasm of skin: Secondary | ICD-10-CM | POA: Diagnosis not present

## 2023-08-30 DIAGNOSIS — D2272 Melanocytic nevi of left lower limb, including hip: Secondary | ICD-10-CM | POA: Diagnosis not present

## 2023-08-30 DIAGNOSIS — D225 Melanocytic nevi of trunk: Secondary | ICD-10-CM | POA: Diagnosis not present

## 2023-08-30 DIAGNOSIS — L570 Actinic keratosis: Secondary | ICD-10-CM | POA: Diagnosis not present

## 2023-08-30 DIAGNOSIS — D2271 Melanocytic nevi of right lower limb, including hip: Secondary | ICD-10-CM | POA: Diagnosis not present

## 2023-09-06 DIAGNOSIS — K080 Exfoliation of teeth due to systemic causes: Secondary | ICD-10-CM | POA: Diagnosis not present

## 2023-11-13 ENCOUNTER — Other Ambulatory Visit: Payer: Self-pay | Admitting: Family Medicine

## 2023-11-13 ENCOUNTER — Telehealth: Payer: Self-pay | Admitting: *Deleted

## 2023-11-13 ENCOUNTER — Encounter: Payer: Self-pay | Admitting: Family Medicine

## 2023-11-13 DIAGNOSIS — I10 Essential (primary) hypertension: Secondary | ICD-10-CM

## 2023-11-13 MED ORDER — VALSARTAN-HYDROCHLOROTHIAZIDE 160-25 MG PO TABS
ORAL_TABLET | ORAL | 1 refills | Status: DC
Start: 2023-11-13 — End: 2023-11-19

## 2023-11-13 NOTE — Telephone Encounter (Signed)
 Copied from CRM [id number]. Topic: Clinical - Lab/Test Results >> Nov 13, 2023 11:53 AM Lizabeth Riggs wrote: Reason for CRM:  Fonda made her annual physical for 02/13/24 with Dr. Arabella Beach. She wants to check to see if she needs to come in before to complete her annual labs. No lab orders are in her chart. Please give her a call and let her know if she needs to come in early. Her number is [phone]. Thanks

## 2023-11-13 NOTE — Telephone Encounter (Signed)
 Copied from CRM [id number]. Topic: Clinical - Medication Refill >> Nov 13, 2023 11:51 AM Cynthia K wrote: Medication: valsartan -hydrochlorothiazide  (DIOVAN -HCT) 160-25 MG tablet   Has the patient contacted their pharmacy? Yes (Agent: If no, request that the patient contact the pharmacy for the refill. If patient does not wish to contact the pharmacy document the reason why and proceed with request.) (Agent: If yes, when and what did the pharmacy advise?) Pharmacy needs order to refill  This is the patient's preferred pharmacy:  [id number] Jon[place] Nestle, [place] - [address] AT [facility] [address] [place] [address] Phone: [phone] Fax: [phone]  [facility] [address] [place] [address] Phone: [phone] Fax: [phone]  Is this the correct pharmacy for this prescription? Yes If no, delete pharmacy and type the correct one.   Has the prescription been filled recently? No  Is the patient out of the medication? No  Has the patient been seen for an appointment in the last year OR does the patient have an upcoming appointment? Yes  Can we respond through MyChart? Yes  Agent: Please be advised that Rx refills may take up to 3 business days. We ask that you follow-up with your pharmacy.

## 2023-11-13 NOTE — Telephone Encounter (Signed)
 Contacted pt and appt changed to Marissa Duarte and a lab appt scheduled.

## 2023-11-19 ENCOUNTER — Other Ambulatory Visit: Payer: Self-pay

## 2023-11-19 DIAGNOSIS — I10 Essential (primary) hypertension: Secondary | ICD-10-CM

## 2023-11-19 MED ORDER — VALSARTAN-HYDROCHLOROTHIAZIDE 160-25 MG PO TABS
ORAL_TABLET | ORAL | 1 refills | Status: DC
Start: 2023-11-19 — End: 2024-02-13

## 2023-11-19 NOTE — Telephone Encounter (Signed)
 Copied from CRM [id number]. Topic: Clinical - Medication Question >> Nov 19, 2023  9:08 AM Georgeann Kindred wrote: Reason for CRM: Patient calling requesting a status update on : valsartan -hydrochlorothiazide  (DIOVAN -HCT) 160-25 MG tablet. She states that she changed her pharmacy that she wanted the medication to go to and when she called them they stated they have not received the prescription. Patient's preferred pharmacy is [facility] #[id number] [place] Nestle, [place] - [address] AT [facility] [address] [place] [address] Phone: [phone] Fax: [phone]

## 2023-11-26 ENCOUNTER — Encounter (HOSPITAL_BASED_OUTPATIENT_CLINIC_OR_DEPARTMENT_OTHER): Payer: Self-pay | Admitting: Obstetrics & Gynecology

## 2023-11-26 DIAGNOSIS — Z780 Asymptomatic menopausal state: Secondary | ICD-10-CM

## 2024-01-18 ENCOUNTER — Encounter (HOSPITAL_BASED_OUTPATIENT_CLINIC_OR_DEPARTMENT_OTHER): Payer: Self-pay | Admitting: Obstetrics & Gynecology

## 2024-01-18 DIAGNOSIS — M8589 Other specified disorders of bone density and structure, multiple sites: Secondary | ICD-10-CM | POA: Diagnosis not present

## 2024-01-18 DIAGNOSIS — Z1231 Encounter for screening mammogram for malignant neoplasm of breast: Secondary | ICD-10-CM | POA: Diagnosis not present

## 2024-01-21 DIAGNOSIS — H35033 Hypertensive retinopathy, bilateral: Secondary | ICD-10-CM | POA: Diagnosis not present

## 2024-01-21 DIAGNOSIS — H524 Presbyopia: Secondary | ICD-10-CM | POA: Diagnosis not present

## 2024-02-04 ENCOUNTER — Other Ambulatory Visit: Payer: Self-pay

## 2024-02-04 DIAGNOSIS — Z1159 Encounter for screening for other viral diseases: Secondary | ICD-10-CM

## 2024-02-04 DIAGNOSIS — Z13228 Encounter for screening for other metabolic disorders: Secondary | ICD-10-CM

## 2024-02-06 ENCOUNTER — Other Ambulatory Visit

## 2024-02-06 DIAGNOSIS — Z13228 Encounter for screening for other metabolic disorders: Secondary | ICD-10-CM | POA: Diagnosis not present

## 2024-02-06 DIAGNOSIS — Z1159 Encounter for screening for other viral diseases: Secondary | ICD-10-CM

## 2024-02-06 DIAGNOSIS — Z130 Encounter for screening for diseases of the blood and blood-forming organs and certain disorders involving the immune mechanism: Secondary | ICD-10-CM

## 2024-02-06 DIAGNOSIS — Z1321 Encounter for screening for nutritional disorder: Secondary | ICD-10-CM | POA: Diagnosis not present

## 2024-02-06 DIAGNOSIS — Z1329 Encounter for screening for other suspected endocrine disorder: Secondary | ICD-10-CM | POA: Diagnosis not present

## 2024-02-07 ENCOUNTER — Encounter (HOSPITAL_BASED_OUTPATIENT_CLINIC_OR_DEPARTMENT_OTHER): Payer: Self-pay | Admitting: Obstetrics & Gynecology

## 2024-02-07 ENCOUNTER — Ambulatory Visit: Payer: Self-pay

## 2024-02-08 LAB — CBC WITH DIFFERENTIAL/PLATELET
Basophils Absolute: 0 x10E3/uL (ref 0.0–0.2)
Basos: 1 %
EOS (ABSOLUTE): 0.2 x10E3/uL (ref 0.0–0.4)
Eos: 4 %
Hematocrit: 40 % (ref 34.0–46.6)
Hemoglobin: 13.2 g/dL (ref 11.1–15.9)
Immature Grans (Abs): 0 x10E3/uL (ref 0.0–0.1)
Immature Granulocytes: 0 %
Lymphocytes Absolute: 1.1 x10E3/uL (ref 0.7–3.1)
Lymphs: 26 %
MCH: 31.6 pg (ref 26.6–33.0)
MCHC: 33 g/dL (ref 31.5–35.7)
MCV: 96 fL (ref 79–97)
Monocytes Absolute: 0.5 x10E3/uL (ref 0.1–0.9)
Monocytes: 11 %
Neutrophils Absolute: 2.5 x10E3/uL (ref 1.4–7.0)
Neutrophils: 58 %
Platelets: 184 x10E3/uL (ref 150–450)
RBC: 4.18 x10E6/uL (ref 3.77–5.28)
RDW: 12.2 % (ref 11.7–15.4)
WBC: 4.3 x10E3/uL (ref 3.4–10.8)

## 2024-02-08 LAB — HIV ANTIBODY (ROUTINE TESTING W REFLEX): HIV Screen 4th Generation wRfx: NONREACTIVE

## 2024-02-08 LAB — LIPID PANEL
Chol/HDL Ratio: 3.9 ratio (ref 0.0–4.4)
Cholesterol, Total: 199 mg/dL (ref 100–199)
HDL: 51 mg/dL (ref >39)
LDL Chol Calc (NIH): 131 mg/dL — ABNORMAL HIGH (ref 0–99)
Triglycerides: 96 mg/dL (ref 0–149)
VLDL Cholesterol Cal: 17 mg/dL (ref 5–40)

## 2024-02-08 LAB — VITAMIN D 25 HYDROXY (VIT D DEFICIENCY, FRACTURES): Vit D, 25-Hydroxy: 51 ng/mL (ref 30.0–100.0)

## 2024-02-08 LAB — COMPREHENSIVE METABOLIC PANEL WITH GFR
ALT: 19 IU/L (ref 0–32)
AST: 21 IU/L (ref 0–40)
Albumin: 4.1 g/dL (ref 3.9–4.9)
Alkaline Phosphatase: 65 IU/L (ref 44–121)
BUN/Creatinine Ratio: 18 (ref 12–28)
BUN: 12 mg/dL (ref 8–27)
Bilirubin Total: 0.7 mg/dL (ref 0.0–1.2)
CO2: 27 mmol/L (ref 20–29)
Calcium: 8.8 mg/dL (ref 8.7–10.3)
Chloride: 101 mmol/L (ref 96–106)
Creatinine, Ser: 0.65 mg/dL (ref 0.57–1.00)
Globulin, Total: 1.7 g/dL (ref 1.5–4.5)
Glucose: 96 mg/dL (ref 70–99)
Potassium: 4 mmol/L (ref 3.5–5.2)
Sodium: 139 mmol/L (ref 134–144)
Total Protein: 5.8 g/dL — ABNORMAL LOW (ref 6.0–8.5)
eGFR: 98 mL/min/1.73 (ref >59)

## 2024-02-08 LAB — HEMOGLOBIN A1C
Est. average glucose Bld gHb Est-mCnc: 105 mg/dL
Hgb A1c MFr Bld: 5.3 % (ref 4.8–5.6)

## 2024-02-08 LAB — TSH: TSH: 1.46 u[IU]/mL (ref 0.450–4.500)

## 2024-02-13 ENCOUNTER — Encounter: Admitting: Family Medicine

## 2024-02-13 ENCOUNTER — Ambulatory Visit (INDEPENDENT_AMBULATORY_CARE_PROVIDER_SITE_OTHER)

## 2024-02-13 VITALS — BP 125/84 | HR 81 | Temp 98.1°F | Ht 64.0 in | Wt 192.1 lb

## 2024-02-13 DIAGNOSIS — I10 Essential (primary) hypertension: Secondary | ICD-10-CM

## 2024-02-13 DIAGNOSIS — Z0000 Encounter for general adult medical examination without abnormal findings: Secondary | ICD-10-CM

## 2024-02-13 DIAGNOSIS — E7800 Pure hypercholesterolemia, unspecified: Secondary | ICD-10-CM

## 2024-02-13 DIAGNOSIS — E669 Obesity, unspecified: Secondary | ICD-10-CM | POA: Diagnosis not present

## 2024-02-13 MED ORDER — VALSARTAN-HYDROCHLOROTHIAZIDE 160-25 MG PO TABS: ORAL_TABLET | ORAL | 2 refills | Status: DC

## 2024-02-13 NOTE — Patient Instructions (Signed)
 VISIT SUMMARY: Today, you had your annual physical exam. We reviewed your current health status, including your cholesterol levels, blood pressure, and overall wellness. We also discussed your exercise routine, diet, and recent lab results.  YOUR PLAN: -ADULT WELLNESS VISIT: Your routine wellness visit showed normal labs except for a mildly low total protein level. Continue your current exercise routine, aiming for 150 minutes per week, and try to increase your protein intake. We will monitor your cholesterol levels and recheck your blood pressure before you leave. Your prescription for Valsartan  HCTZ has been sent to [facility] on [address].  -ESSENTIAL HYPERTENSION: Your blood pressure was slightly elevated at 133/89 but is generally well-controlled with your current medication. We will recheck your blood pressure before you leave, and your prescription for Valsartan  HCTZ has been sent to [facility] on [address].  -HYPERLIPIDEMIA: Your LDL cholesterol is mildly elevated at 131, which is an improvement from last year. There is no need for medication at this time. Continue with your current exercise routine and dietary modifications, and we will keep monitoring your cholesterol levels.   -GENERAL HEALTH MAINTENANCE: We discussed your vaccinations and screenings. All your vaccines are up to date except for pneumonia and flu. Consider getting a one-time pneumonia vaccine and a flu vaccine, which is available now or at retail pharmacies. We will follow up on your bone density scan results.  INSTRUCTIONS: Please recheck your blood pressure before leaving. Follow up on the bone density scan results once they are available. Consider getting the pneumonia and flu vaccines.  If you have any problems before your next visit feel free to message me via MyChart (minor issues or questions) or call the office, otherwise you may reach out to schedule an office visit.  Thank you! Saddie Sacks, PA-C

## 2024-02-13 NOTE — Assessment & Plan Note (Signed)
 BP Goal <140/90. Stable, at goal. Continue valsartan -hydrochlorothiazide  160-25 mg daily. CMP WNL. Will cont to monitor.

## 2024-02-13 NOTE — Assessment & Plan Note (Signed)
 Lipid panel: LDL 131, HDL 51, Tri 96. The 10-year ASCVD risk score (Arnett DK, et al., 2019) is: 8.3%. Denies family history of early CAD. Continue with heart healthy diet and routine physical activity. Will continue to monitor.

## 2024-02-13 NOTE — Assessment & Plan Note (Signed)
-   Routine wellness visit with normal labs except mildly low total protein. Negative HIV screening. Previous polycystic kidney disease testing negative. - Continue current exercise regimen, aim for 150 minutes per week. - Increase protein intake to address low total protein level. - Monitor cholesterol levels, continue exercise and dietary modifications. - Vaccinations and screenings discussed. All vaccines up to date except pneumonia and flu. Bone density scan completed, awaiting results. Mammo UTD.  - Consider pneumonia vaccine, one-time dose. - Consider flu vaccine, available now or at retail pharmacies. - Follow up on bone density scan results.

## 2024-02-13 NOTE — Assessment & Plan Note (Signed)
 Weight stable at 192 lbs. Encouraged continued weight loss efforts. - Continue current exercise regimen, aim for 150 minutes per week. - Maintain dietary modifications to support weight loss.

## 2024-02-13 NOTE — Progress Notes (Signed)
 Complete physical exam  Patient: Marissa Duarte   DOB: 03/23/1961   63 y.o. Female  MRN: [id number]  Subjective:    Chief Complaint  Patient presents with   Annual Exam    History of Present Illness   Marissa Duarte is a 63 year old female who presents for an annual physical exam.  Hyperlipidemia - LDL cholesterol mildly elevated at 131 mg/dL on most recent labs - Triglycerides decreased compared to previous results - Engages in regular exercise, walking over a mile at least three times a week with a goal to increase to four times a week  Hypertension - Takes Valsartan  HCTZ 160/25 mg, half a tablet daily - Occasionally checks blood pressure at home, usually adequate for blood donation   Wellbeing - Bowel movements regular - Sleeps well at night - Diet well balanced with protein (tuna), fruits, and vegetables - Weight stable at 192 pounds over the past year          Most recent fall risk assessment:    02/13/2024    9:53 AM  Fall Risk   Falls in the past year? 0  Follow up Falls evaluation completed     Most recent depression screenings:    02/13/2024    9:53 AM 02/13/2024    9:48 AM  PHQ 2/9 Scores  PHQ - 2 Score 0 0        Patient Care Team: Gayle Saddie JULIANNA DEVONNA as PCP - General (Physician Assistant) Mammography, [facility] (Diagnostic Radiology)   Outpatient Medications Prior to Visit  Medication Sig   Omega-3 Fatty Acids (FISH OIL PO) Take 1,280 mg by mouth every other day.   Turmeric (QC TUMERIC COMPLEX PO) Take 1,500 mg by mouth every other day.   [DISCONTINUED] valsartan -hydrochlorothiazide  (DIOVAN -HCT) 160-25 MG tablet TAKE 1/2 TABLET BY MOUTH DAILY   No facility-administered medications prior to visit.    ROS        Objective:     BP 125/84   Pulse 81   Temp 98.1 F (36.7 C) (Oral)   Ht 5' 4 (1.626 m)   Wt 192 lb 1.9 oz (87.1 kg)   LMP 06/19/2012   SpO2 97%   BMI 32.98 kg/m    Physical Exam   No results found for any visits  on 02/13/24. Last CBC Lab Results  Component Value Date   WBC 4.3 02/06/2024   HGB 13.2 02/06/2024   HCT 40.0 02/06/2024   MCV 96 02/06/2024   MCH 31.6 02/06/2024   RDW 12.2 02/06/2024   PLT 184 02/06/2024   Last metabolic panel Lab Results  Component Value Date   GLUCOSE 96 02/06/2024   NA 139 02/06/2024   K 4.0 02/06/2024   CL 101 02/06/2024   CO2 27 02/06/2024   BUN 12 02/06/2024   CREATININE 0.65 02/06/2024   EGFR 98 02/06/2024   CALCIUM 8.8 02/06/2024   PROT 5.8 (L) 02/06/2024   ALBUMIN  4.1 02/06/2024   LABGLOB 1.7 02/06/2024   AGRATIO 2.0 02/03/2022   BILITOT 0.7 02/06/2024   ALKPHOS 65 02/06/2024   AST 21 02/06/2024   ALT 19 02/06/2024   ANIONGAP 8 12/23/2021   Last lipids Lab Results  Component Value Date   CHOL 199 02/06/2024   HDL 51 02/06/2024   LDLCALC 131 (H) 02/06/2024   TRIG 96 02/06/2024   CHOLHDL 3.9 02/06/2024   Last hemoglobin A1c Lab Results  Component Value Date   HGBA1C 5.3 02/06/2024   Last thyroid  functions Lab Results  Component Value Date   TSH 1.460 02/06/2024   Last vitamin D  Lab Results  Component Value Date   VD25OH 51.0 02/06/2024        Assessment & Plan:    Routine Health Maintenance and Physical Exam  Health Maintenance  Topic Date Due   Medicare Annual Wellness Visit  Never done   DEXA scan (bone density measurement)  Never done   Flu Shot  09/16/2024*   Pneumococcal Vaccine for age over 17 (1 of 1 - PCV) 02/12/2025*   Mammogram  01/08/2025   DTaP/Tdap/Td vaccine (2 - Tdap) 04/19/2026   Pap with HPV screening  01/09/2028   Colon Cancer Screening  10/11/2031   Hepatitis C Screening  Completed   HIV Screening  Completed   Zoster (Shingles) Vaccine  Completed   Hepatitis B Vaccine  Aged Out   HPV Vaccine  Aged Out   Meningitis B Vaccine  Aged Out   COVID-19 Vaccine  Discontinued  *Topic was postponed. The date shown is not the original due date.    Discussed health benefits of physical activity, and  encouraged her to engage in regular exercise appropriate for her age and condition.  Pure hypercholesterolemia Assessment & Plan: Lipid panel: LDL 131, HDL 51, Tri 96. The 10-year ASCVD risk score (Arnett DK, et al., 2019) is: 8.3%. Denies family history of early CAD. Continue with heart healthy diet and routine physical activity. Will continue to monitor.     Essential hypertension, benign Assessment & Plan: BP Goal <140/90. Stable, at goal. Continue valsartan -hydrochlorothiazide  160-25 mg daily. CMP WNL. Will cont to monitor.  Orders: -     Valsartan -hydroCHLOROthiazide ; TAKE 1/2 TABLET BY MOUTH DAILY  Dispense: 45 tablet; Refill: 2  Obesity (BMI 35.0-39.9 without comorbidity) Assessment & Plan: Weight stable at 192 lbs. Encouraged continued weight loss efforts. - Continue current exercise regimen, aim for 150 minutes per week. - Maintain dietary modifications to support weight loss.   Healthcare maintenance Assessment & Plan: - Routine wellness visit with normal labs except mildly low total protein. Negative HIV screening. Previous polycystic kidney disease testing negative. - Continue current exercise regimen, aim for 150 minutes per week. - Increase protein intake to address low total protein level. - Monitor cholesterol levels, continue exercise and dietary modifications. - Vaccinations and screenings discussed. All vaccines up to date except pneumonia and flu. Bone density scan completed, awaiting results. Mammo UTD.  - Consider pneumonia vaccine, one-time dose. - Consider flu vaccine, available now or at retail pharmacies. - Follow up on bone density scan results.       Return in about 1 year (around 02/12/2025) for Physical.     Saddie JULIANNA Sacks, PA-C

## 2024-02-26 ENCOUNTER — Ambulatory Visit (HOSPITAL_BASED_OUTPATIENT_CLINIC_OR_DEPARTMENT_OTHER): Payer: Self-pay | Admitting: Obstetrics & Gynecology

## 2024-03-21 ENCOUNTER — Encounter (HOSPITAL_BASED_OUTPATIENT_CLINIC_OR_DEPARTMENT_OTHER): Payer: Self-pay | Admitting: Obstetrics & Gynecology

## 2024-03-24 DIAGNOSIS — K080 Exfoliation of teeth due to systemic causes: Secondary | ICD-10-CM | POA: Diagnosis not present

## 2024-05-19 ENCOUNTER — Other Ambulatory Visit: Payer: Self-pay

## 2024-05-19 DIAGNOSIS — I10 Essential (primary) hypertension: Secondary | ICD-10-CM

## 2025-02-06 ENCOUNTER — Other Ambulatory Visit

## 2025-02-13 ENCOUNTER — Encounter
